# Patient Record
Sex: Female | Born: 1995 | Race: Black or African American | Hispanic: No | Marital: Single | Smoking: Never smoker
Health system: Southern US, Community
[De-identification: ages and names within clinical notes are randomized; demographics above are authoritative.]

## PROBLEM LIST (undated history)

## (undated) DIAGNOSIS — F418 Other specified anxiety disorders: Secondary | ICD-10-CM

## (undated) DIAGNOSIS — Z7289 Other problems related to lifestyle: Secondary | ICD-10-CM

## (undated) DIAGNOSIS — I519 Heart disease, unspecified: Secondary | ICD-10-CM

## (undated) DIAGNOSIS — F319 Bipolar disorder, unspecified: Secondary | ICD-10-CM

## (undated) DIAGNOSIS — F411 Generalized anxiety disorder: Secondary | ICD-10-CM

## (undated) DIAGNOSIS — G43909 Migraine, unspecified, not intractable, without status migrainosus: Secondary | ICD-10-CM

## (undated) DIAGNOSIS — R51 Headache: Secondary | ICD-10-CM

## (undated) DIAGNOSIS — F259 Schizoaffective disorder, unspecified: Secondary | ICD-10-CM

## (undated) DIAGNOSIS — N83201 Unspecified ovarian cyst, right side: Secondary | ICD-10-CM

## (undated) DIAGNOSIS — T7840XA Allergy, unspecified, initial encounter: Secondary | ICD-10-CM

## (undated) DIAGNOSIS — Q213 Tetralogy of Fallot: Secondary | ICD-10-CM

## (undated) DIAGNOSIS — I4510 Unspecified right bundle-branch block: Secondary | ICD-10-CM

## (undated) DIAGNOSIS — R440 Auditory hallucinations: Secondary | ICD-10-CM

## (undated) HISTORY — DX: Other specified anxiety disorders: F41.8

## (undated) HISTORY — DX: Heart disease, unspecified: I51.9

## (undated) HISTORY — DX: Headache: R51

## (undated) HISTORY — PX: THORACIC DUCT LIGATION: SHX2499

## (undated) HISTORY — PX: CARDIAC SURGERY: SHX584

## (undated) HISTORY — PX: GASTROSTOMY W/ FEEDING TUBE: SUR642

---
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
## 2016-03-03 ENCOUNTER — Telehealth: Payer: Self-pay | Admitting: *Deleted

## 2016-03-03 ENCOUNTER — Telehealth: Payer: Self-pay | Admitting: Neurology

## 2016-03-03 ENCOUNTER — Ambulatory Visit: Payer: Federal, State, Local not specified - PPO | Admitting: Neurology

## 2016-03-03 NOTE — Telephone Encounter (Signed)
Patient called at 8:35am to cancel her 2:30pm appt due to sickness.

## 2016-03-03 NOTE — Telephone Encounter (Signed)
Patient called 8:35:17 to cancel today's 2:30 appointment w/Dr. Terrace ArabiaYan, patient states she is not feeling well. Patient advised of NS/Cx < 24 hr policy, transferred  Call to A Atkins in billing.

## 2016-03-07 ENCOUNTER — Emergency Department (HOSPITAL_COMMUNITY)
Admission: EM | Admit: 2016-03-07 | Discharge: 2016-03-07 | Disposition: A | Discharge status: Home / Self Care | Payer: Federal, State, Local not specified - PPO | Attending: Emergency Medicine | Admitting: Emergency Medicine

## 2016-03-07 ENCOUNTER — Emergency Department (HOSPITAL_COMMUNITY): Payer: Federal, State, Local not specified - PPO

## 2016-03-07 ENCOUNTER — Encounter (HOSPITAL_COMMUNITY): Payer: Self-pay | Admitting: Emergency Medicine

## 2016-03-07 DIAGNOSIS — K2970 Gastritis, unspecified, without bleeding: Secondary | ICD-10-CM

## 2016-03-07 DIAGNOSIS — N3090 Cystitis, unspecified without hematuria: Secondary | ICD-10-CM | POA: Diagnosis not present

## 2016-03-07 DIAGNOSIS — R1084 Generalized abdominal pain: Secondary | ICD-10-CM | POA: Insufficient documentation

## 2016-03-07 DIAGNOSIS — R300 Dysuria: Secondary | ICD-10-CM | POA: Diagnosis present

## 2016-03-07 DIAGNOSIS — M545 Low back pain, unspecified: Secondary | ICD-10-CM

## 2016-03-07 LAB — COMPREHENSIVE METABOLIC PANEL
ALBUMIN: 4.5 g/dL (ref 3.5–5.0)
ALK PHOS: 59 U/L (ref 38–126)
ALT: 15 U/L (ref 14–54)
ANION GAP: 6 (ref 5–15)
AST: 25 U/L (ref 15–41)
BILIRUBIN TOTAL: 0.5 mg/dL (ref 0.3–1.2)
BUN: 19 mg/dL (ref 6–20)
CALCIUM: 9.3 mg/dL (ref 8.9–10.3)
CO2: 22 mmol/L (ref 22–32)
CREATININE: 0.96 mg/dL (ref 0.44–1.00)
Chloride: 106 mmol/L (ref 101–111)
GFR calc Af Amer: >60 mL/min (ref >60)
GFR calc non Af Amer: >60 mL/min (ref >60)
GLUCOSE: 98 mg/dL (ref 65–99)
Potassium: 4.4 mmol/L (ref 3.5–5.1)
Sodium: 134 mmol/L — ABNORMAL LOW (ref 135–145)
TOTAL PROTEIN: 7.1 g/dL (ref 6.5–8.1)

## 2016-03-07 LAB — URINALYSIS, ROUTINE W REFLEX MICROSCOPIC
BILIRUBIN URINE: NEGATIVE
Glucose, UA: NEGATIVE mg/dL
HGB URINE DIPSTICK: NEGATIVE
KETONES UR: NEGATIVE mg/dL
Leukocytes, UA: NEGATIVE
NITRITE: NEGATIVE
Protein, ur: NEGATIVE mg/dL
Specific Gravity, Urine: 1.03 (ref 1.005–1.030)
pH: 6.5 (ref 5.0–8.0)

## 2016-03-07 LAB — CBC
HCT: 42.9 % (ref 36.0–46.0)
Hemoglobin: 14.3 g/dL (ref 12.0–15.0)
MCH: 30.2 pg (ref 26.0–34.0)
MCHC: 33.3 g/dL (ref 30.0–36.0)
MCV: 90.7 fL (ref 78.0–100.0)
Platelets: 130 10*3/uL — ABNORMAL LOW (ref 150–400)
RBC: 4.73 MIL/uL (ref 3.87–5.11)
RDW: 12.4 % (ref 11.5–15.5)
WBC: 3.3 10*3/uL — ABNORMAL LOW (ref 4.0–10.5)

## 2016-03-07 LAB — I-STAT BETA HCG BLOOD, ED (MC, WL, AP ONLY)

## 2016-03-07 LAB — I-STAT CG4 LACTIC ACID, ED: LACTIC ACID, VENOUS: 0.97 mmol/L (ref 0.5–1.9)

## 2016-03-07 MED ORDER — DEXTROSE 5 % IV SOLN
1.0000 g | Freq: Once | INTRAVENOUS | Status: AC
  Administered 2016-03-07: 1 g via INTRAVENOUS
  Filled 2016-03-07: qty 10

## 2016-03-07 MED ORDER — HYDROCODONE-ACETAMINOPHEN 5-325 MG PO TABS
2.0000 | ORAL_TABLET | Freq: Once | ORAL | Status: AC
  Administered 2016-03-07: 2 via ORAL
  Filled 2016-03-07: qty 2

## 2016-03-07 MED ORDER — PHENAZOPYRIDINE HCL 200 MG PO TABS: 200.0000 mg | ORAL_TABLET | Freq: Three times a day (TID) | ORAL | 0 refills | Status: DC | PRN

## 2016-03-07 MED ORDER — ONDANSETRON HCL 4 MG/2ML IJ SOLN
4.0000 mg | Freq: Once | INTRAMUSCULAR | Status: AC
  Administered 2016-03-07: 4 mg via INTRAVENOUS
  Filled 2016-03-07: qty 2

## 2016-03-07 MED ORDER — MORPHINE SULFATE (PF) 4 MG/ML IV SOLN
4.0000 mg | Freq: Once | INTRAVENOUS | Status: AC
  Administered 2016-03-07: 4 mg via INTRAVENOUS
  Filled 2016-03-07: qty 1

## 2016-03-07 MED ORDER — PHENAZOPYRIDINE HCL 100 MG PO TABS
200.0000 mg | ORAL_TABLET | Freq: Once | ORAL | Status: AC
  Administered 2016-03-07: 200 mg via ORAL
  Filled 2016-03-07: qty 2

## 2016-03-07 MED ORDER — CEPHALEXIN 500 MG PO CAPS: 500.0000 mg | ORAL_CAPSULE | Freq: Four times a day (QID) | ORAL | 0 refills | Status: DC

## 2016-03-07 MED ORDER — IOPAMIDOL (ISOVUE-300) INJECTION 61%
INTRAVENOUS | Status: AC
  Administered 2016-03-07: 100 mL
  Filled 2016-03-07: qty 100

## 2016-03-07 MED ORDER — IOPAMIDOL (ISOVUE-300) INJECTION 61%
INTRAVENOUS | Status: AC
  Filled 2016-03-07: qty 30

## 2016-03-07 MED ORDER — SODIUM CHLORIDE 0.9 % IV BOLUS (SEPSIS)
500.0000 mL | Freq: Once | INTRAVENOUS | Status: AC
  Administered 2016-03-07: 500 mL via INTRAVENOUS

## 2016-03-07 NOTE — ED Notes (Signed)
Pt finished contrast and ambulated to br.

## 2016-03-07 NOTE — ED Notes (Signed)
Patient drinking oral contrast at this time.

## 2016-03-07 NOTE — ED Triage Notes (Signed)
Patient arrives with continued complaints of lower back pain. Patient was seen about 5 days ago for the same and found to have a bladder infection. Has been taking Septra since Wednesday with no improvement of symptoms. Now endorses general malaise and diffuse aching pain throughout body coupled with chest pain. 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 Last surgery @ age 20 with good checkups since. Now states that she has dysuria and urgency too.

## 2016-03-07 NOTE — ED Notes (Signed)
Patient transported to CT 

## 2016-03-07 NOTE — ED Notes (Signed)
Patient transported to X-ray 

## 2016-03-07 NOTE — Discharge Instructions (Signed)
1. Medications: add Keflex to the Bactrim that you are already taking, pyridium, usual home medications 2. Treatment: rest, drink plenty of fluids, take medications as prescribed 3. Follow Up: Please followup with your primary doctor in 3 days for discussion of your diagnoses and further evaluation after today's visit; if you do not have a primary care doctor use the resource guide provided to find one; return to the ER for fevers, persistent vomiting, worsening abdominal pain or other concerning symptoms.

## 2016-03-07 NOTE — ED Provider Notes (Signed)
[facility]-EMERGENCY DEPT Provider Note   CSN: [id number] Arrival date & time: 03/07/16  0034     History   Chief Complaint Chief Complaint  Patient presents with  . Dysuria  . Back Pain    HPI Alexis [name] is a 20 y.o. female with a hx of headache, Tetralogy of flow repaired at age 21 and followed by Dr. Ace Gins of [facility] cardiology presents to the Emergency Department complaining of gradual, persistent, progressively worsening total body pain onset 2 days ago. Patient reports that 4 days ago she began experiencing low back pain and dysuria. She was evaluated at an urgent care and diagnosed with urinary tract infection. She reports she began Bactrim 3 days ago and has been compliant. She reports that the pain in her low back has worsened and now has spread throughout her entire body. She denies known falls or injuries. She reports that lumbar x-rays taken at the urgent care showed scoliosis but no other acute abnormalities. Patient also reports associated generalized abdominal pain and nausea without vomiting. She reports unmeasured fever 3 days ago that has not returned. Dysuria persists. No hematuria, urinary frequency or urinary urgency. She denies vaginal discharge.    The history is provided by the patient and medical records. No language interpreter was used.    Past Medical History:  Diagnosis Date  . Allergy   . Depression with anxiety   . Headache(784.0)   . Heart disease   . 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 Chronic migraine without aura 08/31/2012  . Insomnia, unspecified 08/31/2012  . 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 Adjustment disorder 07/26/2011  . Abdominal pain 07/26/2011    Past Surgical History:  Procedure Laterality Date  . CARDIAC SURGERY    . CARDIAC SURGERY     4 open heart surgeries  . GASTROSTOMY W/ FEEDING TUBE     removed 1 year ago   . THORACIC DUCT LIGATION      OB History    No data available         Home Medications    Prior to Admission medications   Medication Sig Start Date End Date Taking? Authorizing Provider  buPROPion (WELLBUTRIN XL) 150 MG 24 hr tablet Take 150 mg by mouth every morning. 10/23/15  Yes Historical Provider, MD  divalproex (DEPAKOTE ER) 500 MG 24 hr tablet Take 1 tablet (500 mg total) by mouth at bedtime. 11/27/15  Yes Levert Feinstein, MD  naproxen sodium (ANAPROX) 220 MG tablet Take 220 mg by mouth 2 (two) times daily as needed (for pain).   Yes Historical Provider, MD  traMADol (ULTRAM) 50 MG tablet Take 50 mg by mouth every 6 (six) hours as needed for moderate pain.   Yes Historical Provider, MD  cephALEXin (KEFLEX) 500 MG capsule Take 1 capsule (500 mg total) by mouth 4 (four) times daily. 03/07/16   Maizie Garno, PA-C  phenazopyridine (PYRIDIUM) 200 MG tablet Take 1 tablet (200 mg total) by mouth 3 (three) times daily as needed for pain. 03/07/16   Dahlia Client Melana Hingle, PA-C    Family History Family History  Problem Relation Age of Onset  . Hypertension Maternal Grandmother   . Diabetes Maternal Grandfather   . Heart disease Maternal Grandfather   . Stroke Maternal Grandfather   . Hypertension Mother   . Healthy Father     Social History Social History  Substance Use Topics  . Smoking status: Never  Smoker  . Smokeless tobacco: Never Used  . Alcohol use No     Allergies   Dopamine   Review of Systems Review of Systems  Constitutional: Positive for fever ( Subjective).  Gastrointestinal: Positive for abdominal pain ( Generalized).  Genitourinary: Positive for dysuria.  Musculoskeletal: Positive for back pain ( low) and myalgias.  All other systems reviewed and are negative.    Physical Exam Updated Vital Signs BP 115/80   Pulse 63   Temp 98.3 F (36.8 C) (Oral)   Resp 18   Ht 5\' 6"  (1.676 m)   Wt 49.6 kg   LMP 02/29/2016 (Exact Date)   SpO2 100%   BMI 17.64 kg/m   Physical Exam  Constitutional: She appears  well-developed and well-nourished. No distress.  Awake, alert, nontoxic appearance  HENT:  Head: Normocephalic and atraumatic.  Mouth/Throat: Oropharynx is clear and moist. No oropharyngeal exudate.  Eyes: Conjunctivae are normal. No scleral icterus.  Neck: Normal range of motion. Neck supple.  Cardiovascular: Normal rate, regular rhythm and intact distal pulses.   Murmur heard. Sternotomy scar noted  Pulmonary/Chest: Effort normal and breath sounds normal. No respiratory distress. She has no wheezes.  Equal chest expansion  Abdominal: Soft. Bowel sounds are normal. She exhibits no mass. There is no splenomegaly or hepatomegaly. There is generalized tenderness. There is no rigidity, no rebound, no guarding, no tenderness at McBurney's point and negative Murphy's sign.  Musculoskeletal: Normal range of motion. She exhibits no edema.  Neurological: She is alert.  Speech is clear and goal oriented Moves extremities without ataxia  Skin: Skin is warm and dry. She is not diaphoretic.  Psychiatric: She has a normal mood and affect.  Nursing note and vitals reviewed.    ED Treatments / Results  Labs (all labs ordered are listed, but only abnormal results are displayed) Labs Reviewed  COMPREHENSIVE METABOLIC PANEL - Abnormal; Notable for the following:       Result Value   Sodium 134 (*)    All other components within normal limits  CBC - Abnormal; Notable for the following:    WBC 3.3 (*)    Platelets 130 (*)    All other components within normal limits  URINE CULTURE  URINALYSIS, ROUTINE W REFLEX MICROSCOPIC (NOT AT [facility])  I-STAT CG4 LACTIC ACID, ED  I-STAT BETA HCG BLOOD, ED ([facility], [facility], [facility] ONLY)     Radiology Dg Chest 2 View  Result Date: 03/07/2016 CLINICAL DATA:  Low back pain. History of tetralogy of Fallot, multiple cardiac surgeries. EXAM: CHEST  2 VIEW COMPARISON:  01/28/2015 FINDINGS: The heart is enlarged but stable. Mild prominence the pulmonary vasculature especially  the left is unchanged. Median sternotomy sutures and surgical clips project over the cardiac and mediastinal silhouette. The lungs are free of infiltrates. There is apical pleural parenchymal thickening and minimal scarring. There is left basilar atelectasis. No CHF. No pneumothorax. No acute osseous abnormality. IMPRESSION: Stable cardiomegaly.  No acute cardiopulmonary disease. Electronically Signed   By: Tollie Eth M.D.   On: 03/07/2016 03:31   Ct Abdomen Pelvis W Contrast  Result Date: 03/07/2016 CLINICAL DATA:  20 year old female with abdominal pain and bilateral flank pain. History of UTIs. EXAM: CT ABDOMEN AND PELVIS WITH CONTRAST TECHNIQUE: Multidetector CT imaging of the abdomen and pelvis was performed using the standard protocol following bolus administration of intravenous contrast. CONTRAST:  ISOVUE-300 IOPAMIDOL (ISOVUE-300) INJECTION 61% COMPARISON:  Abdominal CT dated 07/25/2011 and ultrasound dated 07/28/2011 FINDINGS: Lower chest: The  visualized lung bases are clear. Mild eventration of the left hemidiaphragm. No intra-abdominal free air.  Small free fluid within the pelvis. Hepatobiliary: There is mild haziness of the gallbladder wall. No stone identified. Mild periportal edema may be present. There is slight heterogeneity of the liver. A 9 x 8 mm hypo enhancing focus at the dome of the liver (series 2, image 17 and coronal series 5 image 61) is not well characterized but may represent a flash filling hemangioma or a portal venous shunting. MRI may provide better characterisation. Pancreas: Unremarkable. No pancreatic ductal dilatation or surrounding inflammatory changes. Spleen: Normal in size without focal abnormality. Adrenals/Urinary Tract: Adrenal glands, kidneys, and the visualized ureters appear unremarkable. There is apparent diffuse thickening of the bladder wall which may be partly related to underdistention but concerning for cystitis. Correlation with urinalysis  recommended. Stomach/Bowel: There is thickened appearance of the distal stomach and gastric antrum concerning for gastritis. Correlation with clinical exam recommended. Moderate stool noted throughout the colon. There is no evidence of bowel obstruction. Normal appendix. Vascular/Lymphatic: The abdominal aorta and IVC appear unremarkable. The origins of the celiac axis, SMA, IMA as well as the origins of the renal arteries are patent. The splenic vein, SMV, and main portal vein are patent. No portal venous gas identified. There is prominence of the pelvic vasculature. Clinical correlation is recommended to evaluate for pelvic congestion syndrome. There is no adenopathy. Reproductive: The uterus is anteverted and grossly unremarkable. There is a 1.5 cm collapsed left ovarian dominant follicle or corpus luteum. Other: There is cardiomegaly. Median sternotomy wire and pectus excavatum deformity. Multiple surgical clips noted along the posterior mediastinum and distal esophagus. Musculoskeletal: No acute osseous pathology. IMPRESSION: Mild thickened appearance of the distal stomach concerning for gastritis. Clinical correlation is recommended. No evidence of bowel obstruction. Normal appendix. Minimal haziness of the gallbladder wall may be related to inflammatory changes of the stomach. Cholecystitis is less likely. No gallstone identified. Ultrasound may provide better evaluation of the gallbladder. Thickened appearance of the bladder wall. Correlation with urinalysis recommended to exclude cystitis. A 1.5 cm left ovarian collapsed corpus luteum or dominant follicle. Electronically Signed   By: Elgie Collard M.D.   On: 03/07/2016 05:36    Procedures Procedures (including critical care time)  Medications Ordered in ED Medications  iopamidol (ISOVUE-300) 61 % injection (not administered)  sodium chloride 0.9 % bolus 500 mL (0 mLs Intravenous Stopped 03/07/16 0313)  HYDROcodone-acetaminophen (NORCO/VICODIN)  5-325 MG per tablet 2 tablet (2 tablets Oral Given 03/07/16 0305)  iopamidol (ISOVUE-300) 61 % injection (100 mLs  Contrast Given 03/07/16 0458)  ondansetron (ZOFRAN) injection 4 mg (4 mg Intravenous Given 03/07/16 0445)  morphine 4 MG/ML injection 4 mg (4 mg Intravenous Given 03/07/16 0553)  cefTRIAXone (ROCEPHIN) 1 g in dextrose 5 % 50 mL IVPB (1 g Intravenous New Bag/Given 03/07/16 0618)  phenazopyridine (PYRIDIUM) tablet 200 mg (200 mg Oral Given 03/07/16 1610)     Initial Impression / Assessment and Plan / ED Course  I have reviewed the triage vital signs and the nursing notes.  Pertinent labs & imaging results that were available during my care of the patient were reviewed by me and considered in my medical decision making (see chart for details).  Clinical Course  Value Comment By Time  I-stat hCG, quantitative: <5.0 Negative Dierdre Forth, PA-C 10/22 0444  Lactic Acid, Venous: 0.97 Normal Dierdre Forth, PA-C 10/22 0444  Hemoglobin: 14.3 No anemia, very mild leukopenia Dierdre Forth, PA-C 10/22  0444  Leukocytes, UA: NEGATIVE No evidence of UTI Dierdre ForthHannah Naythan Douthit, PA-C 10/22 0444  Creatinine: 0.96 Normal and electrolytes reassuring Dierdre ForthHannah Lailie Smead, PA-C 10/22 0444  BP: (!) 68/27 Aberrant reading, repeat within normal limits Dierdre ForthHannah Melanny Wire, PA-C 10/22 0445  BP: 132/86 Vital signs have remained stable. Dierdre ForthHannah Ozan Maclay, PA-C 10/22 [phone]    Patient with generalized abdominal pain, myalgias, low back pain and persistent dysuria.  CT scan shows persistent cystitis and gastritis. Questionable haziness of the gallbladder however on exam patient abdomen is soft and nonfocal. I highly doubt cholecystitis. No emesis.  Patient given Rocephin here in the emergency department. She will discharged home with Keflex in addition to her Bactrim. She is to follow-up with her primary care physician in 24 hours. Discussed reasons to emergency department including worsening  pain, high fevers or other concerns.  The patient was discussed with and seen by Dr. Wilkie AyeHorton who agrees with the treatment plan.   Final Clinical Impressions(s) / ED Diagnoses   Final diagnoses:  Cystitis  Gastritis, presence of bleeding unspecified, unspecified chronicity, unspecified gastritis type  Dysuria  Low back pain without sciatica, unspecified back pain laterality, unspecified chronicity    New Prescriptions New Prescriptions   CEPHALEXIN (KEFLEX) 500 MG CAPSULE    Take 1 capsule (500 mg total) by mouth 4 (four) times daily.   PHENAZOPYRIDINE (PYRIDIUM) 200 MG TABLET    Take 1 tablet (200 mg total) by mouth 3 (three) times daily as needed for pain.     Dahlia ClientHannah Orvel Cutsforth, PA-C 03/07/16 [id number]    Shon Batonourtney F Horton, MD 03/14/16 [phone]

## 2016-03-07 NOTE — ED Notes (Signed)
Patient returned from xray.

## 2016-03-08 LAB — URINE CULTURE: Culture: NO GROWTH

## 2016-03-23 ENCOUNTER — Ambulatory Visit (INDEPENDENT_AMBULATORY_CARE_PROVIDER_SITE_OTHER): Payer: Federal, State, Local not specified - PPO | Admitting: Neurology

## 2016-03-23 ENCOUNTER — Encounter: Payer: Self-pay | Admitting: Neurology

## 2016-03-23 VITALS — BP 99/66 | HR 65 | Ht 66.0 in | Wt 109.0 lb

## 2016-03-23 DIAGNOSIS — Q249 Congenital malformation of heart, unspecified: Secondary | ICD-10-CM | POA: Diagnosis not present

## 2016-03-23 DIAGNOSIS — G43711 Chronic migraine without aura, intractable, with status migrainosus: Secondary | ICD-10-CM

## 2016-03-23 MED ORDER — PROMETHAZINE HCL 12.5 MG PO TABS: 12.5000 mg | ORAL_TABLET | Freq: Four times a day (QID) | ORAL | 6 refills | Status: DC | PRN

## 2016-03-23 NOTE — Progress Notes (Signed)
Chief Complaint  Patient presents with  . Migraine    She estimates 2- 4 headaches per month.  She is being prescribed Depakote ER 500mg , qhs but takes it inconsistently.   She is no longer using Fioricet or Zofran.  States her headaches easily resolve with sleep.      PATIENT: Alexis [name] DOB: [birth year]  Chief Complaint  Patient presents with  . Migraine    She estimates 2- 4 headaches per month.  She is being prescribed Depakote ER 500mg , qhs but takes it inconsistently.   She is no longer using Fioricet or Zofran.  States her headaches easily resolve with sleep.     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  She has migriane since middle school, worsening recently, Her typical migraine are lateralized severe pounding headache with associated light noise sensitivity, nauseous, lasting for a few hours or whole day, she could not function during migraine, she has been taking Aleve one tablet, with limited help, previously she has tried propanolol as preventive medication for 2 months without significant improvement   She has worsening more frequent headaches since 2016, May 2017, she has daily headaches, she denies visual change, no lateralized motor or sensory deficit, in addition she complains of frequent nausea, Zofran helps some,   She has never tried triptan treatment in the past, we have discussed potential cardiac risk factors, with her history of cardiac arrest, with  dopamine use, we decided not to proceed with triptan treatments   [facility]:  1.There is not tricuspid regurgitation velocity with which to reliably predict RV-RA peak pressure difference. 2. No residual ventricular septal defect postoperatively. 3. S/p patch closure ventricular septal defect surgery. 4. Leftward cardiac apex. 5. 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 6. Intact atrial septum. 7. Mildly dilated right ventricle. 8. Normal right ventricular systolic function. 9. Normal-size left atrium. 10. Normal-size right atrium. 11. Normal left ventricular cavity size and systolic function. 12. Normal aortic valve. 13. Right aortic arch with mirror-image branching. 14. Limited visualization of left and right pulmonary arteries; branch pulmonary arteries appear relatively small; peak flow velocity 1.6-2 m/sec. 15. Mild right ventricular dilatation with flat to mildly paradoxic ventricular septal motion in parasternal short axis view. 16. The RV to pulmonary artery conduit was not visualized. 17. No left ventricular outflow tract obstruction. 18. No pericardial effusion.  UPDATE November 27 2015: We have personally reviewed MRI of the brain July 2017, left frontal subcortical white matter single gliosis, no acute abnormality She is able to tolerate Depakote ER 250 mg 2 tablets every night without significant side effect, it does help her headache, she reported 40% improvement, she has less headache, lasting shorter, it also helped her mood  She still has 3-4 headaches each week, lasting about 3 hours, was nausea, light noise sensitivity, She is only taking Aleve as needed for headaches, Zofran was helpful  UPDATE Nov 7th 2017:  Her migraine has much improved, now cluster around her menstruation, she is now taking Depakote as regularly, she is now having migraine headaches couple times a month, sleep usually helps her  headache,  REVIEW OF SYSTEMS: Full 14 system review of systems performed and notable only for : Fatigue, runny nose, cough, chest tightness, abdominal pain, constipation, diarrhea, nausea, vomiting, insomnia, frequent infection, difficulty urinating, painful urination, dizziness, headaches, achy muscles depression anxiety  ALLERGIES: Allergies  Allergen Reactions  . Dopamine     Makes WBC rise    HOME MEDICATIONS: Current Outpatient Prescriptions  Medication Sig Dispense Refill  . buPROPion (WELLBUTRIN XL) 150 MG 24 hr tablet Take 150 mg by mouth every morning.  0  . cephALEXin (KEFLEX) 500 MG capsule Take 1 capsule (500 mg total) by mouth 4 (four) times daily. 40 capsule 0  . divalproex (DEPAKOTE ER) 500 MG 24 hr tablet Take 1 tablet (500 mg total) by mouth at bedtime. 30 tablet 11  . naproxen sodium (ANAPROX) 220 MG tablet Take 220 mg by mouth 2 (two) times daily as needed (for pain).    . phenazopyridine (PYRIDIUM) 200 MG tablet Take 1 tablet (200 mg total) by mouth 3 (three) times daily as needed for pain. 6 tablet 0  . traMADol (ULTRAM) 50 MG tablet Take 50 mg by mouth every 6 (six) hours as needed for moderate pain.     No current facility-administered medications for this visit.     PAST MEDICAL HISTORY: Past Medical History:  Diagnosis Date  . Allergy   . Depression with anxiety   . Headache(784.0)   . Heart disease   . Tetralogy of Fallot     PAST SURGICAL HISTORY: Past Surgical History:  Procedure Laterality Date  . CARDIAC SURGERY    . CARDIAC SURGERY     4 open heart surgeries  . GASTROSTOMY W/ FEEDING TUBE     removed 1 year ago   . THORACIC DUCT LIGATION      FAMILY HISTORY: Family History  Problem Relation Age of Onset  . Hypertension Maternal Grandmother   . Diabetes Maternal Grandfather   . Heart disease Maternal Grandfather   . Stroke Maternal Grandfather   . Hypertension Mother   . Healthy Father     SOCIAL HISTORY:  Social History    Social History  . Marital status: Single    Spouse name: N/A  . Number of children: 0  . Years of education: College   Occupational History  . Student    Social History Main Topics  . Smoking status: Never Smoker  . Smokeless tobacco: Never Used  . Alcohol use No  . Drug use: No  . Sexual activity: Not on file   Other Topics Concern  . Not on file   Social History Narrative   Lives at home with mother.   Right-handed.   No more than 2 cups caffeine per day.     PHYSICAL EXAM   Vitals:   03/23/16 0744  BP: 99/66  Pulse: 65  Weight: 109 lb (49.4 kg)  Height: 5\' 6"  (1.676 m)    Not recorded      Body mass index is 17.59 kg/m.  PHYSICAL EXAMNIATION:  Gen: NAD, conversant, well nourised, obese, well groomed                     Cardiovascular: Regular rate rhythm, systolic ejection murmur Eyes: Conjunctivae clear without exudates or hemorrhage Neck: Supple, no carotid bruise. Pulmonary:  Clear to auscultation bilaterally   NEUROLOGICAL EXAM:  MENTAL STATUS: Speech:    Speech is normal; fluent and spontaneous with normal comprehension.  Cognition:     Orientation to time, place and person     Normal recent and remote memory     Normal Attention span and concentration     Normal Language, naming, repeating,spontaneous speech     Fund of knowledge   CRANIAL NERVES: CN II: Visual fields are full to confrontation. Fundoscopic exam is normal with sharp discs and no vascular changes. Pupils are round equal and briskly reactive to light. CN III, IV, VI: extraocular movement are normal. No ptosis. CN V: Facial sensation is intact to pinprick in all 3 divisions bilaterally. Corneal responses are intact.  CN VII: Face is symmetric with normal eye closure and smile. CN VIII: Hearing is normal to rubbing fingers CN IX, X: Palate elevates symmetrically. Phonation is normal. CN XI: Head turning and shoulder shrug are intact CN XII: Tongue is midline with normal  movements and no atrophy.  MOTOR: There is no pronator drift of out-stretched arms. Muscle bulk and tone are normal. Muscle strength is normal.  REFLEXES: Reflexes are 2+ and symmetric at the biceps, triceps, knees, and ankles. Plantar responses are flexor.  SENSORY: Intact to light touch, pinprick, positional sensation and vibratory sensation are intact in fingers and toes.  COORDINATION: Rapid alternating movements and fine finger movements are intact. There is no dysmetria on finger-to-nose and heel-knee-shin.    GAIT/STANCE: Posture is normal. Gait is steady with normal steps, base, arm swing, and turning. Heel and toe walking are normal. Tandem gait is normal.  Romberg is absent.   DIAGNOSTIC DATA (LABS, IMAGING, TESTING) - I reviewed patient records, labs, notes, testing and imaging myself where available.  ASSESSMENT AND PLAN  [name] N Quest is a 20 y.o. female   Chronic migraine headaches   MRI of the brain; showed no significant abnormality  She has tried Inderal in the past without helping,   Depakote has helped her migraine, no longer require preventive medications   her migraine is much improved now only clustered around her menstruation, as needed Phenergan, NSAIDs, sleep was helpful.   Tetralogy of Fallot,   History of 4 open-heart surgery in the past    Levert Feinstein, M.D. Ph.D.  [facility] [address], [address] [place], [place] [address] Ph: [phone] Fax: [phone]  CC: Maryellen Pile, MD

## 2016-03-23 NOTE — Patient Instructions (Signed)
Magnesium oxide 400 mg twice a day Riboflavin 100 mg twice a day  Phenergan 25 mg as needed for headache and nausea, it is ok to take it together with other headache medication.

## 2016-04-04 ENCOUNTER — Emergency Department (HOSPITAL_COMMUNITY): Payer: Federal, State, Local not specified - PPO

## 2016-04-04 ENCOUNTER — Emergency Department (HOSPITAL_COMMUNITY)
Admission: EM | Admit: 2016-04-04 | Discharge: 2016-04-04 | Disposition: A | Discharge status: Home / Self Care | Payer: Federal, State, Local not specified - PPO | Attending: Emergency Medicine | Admitting: Emergency Medicine

## 2016-04-04 ENCOUNTER — Encounter (HOSPITAL_COMMUNITY): Payer: Self-pay | Admitting: *Deleted

## 2016-04-04 DIAGNOSIS — R0789 Other chest pain: Secondary | ICD-10-CM | POA: Diagnosis not present

## 2016-04-04 DIAGNOSIS — R071 Chest pain on breathing: Secondary | ICD-10-CM | POA: Diagnosis present

## 2016-04-04 LAB — BASIC METABOLIC PANEL
Anion gap: 8 (ref 5–15)
BUN: 19 mg/dL (ref 6–20)
CO2: 20 mmol/L — ABNORMAL LOW (ref 22–32)
Calcium: 9.3 mg/dL (ref 8.9–10.3)
Chloride: 109 mmol/L (ref 101–111)
Creatinine, Ser: 0.99 mg/dL (ref 0.44–1.00)
GFR calc Af Amer: >60 mL/min (ref >60)
Glucose, Bld: 96 mg/dL (ref 65–99)
POTASSIUM: 3.9 mmol/L (ref 3.5–5.1)
SODIUM: 137 mmol/L (ref 135–145)

## 2016-04-04 LAB — TROPONIN I: Troponin I: <0.03 ng/mL (ref <0.03)

## 2016-04-04 LAB — CBC
HEMATOCRIT: 40.2 % (ref 36.0–46.0)
HEMOGLOBIN: 13.1 g/dL (ref 12.0–15.0)
MCH: 29.9 pg (ref 26.0–34.0)
MCHC: 32.6 g/dL (ref 30.0–36.0)
MCV: 91.8 fL (ref 78.0–100.0)
Platelets: 125 10*3/uL — ABNORMAL LOW (ref 150–400)
RBC: 4.38 MIL/uL (ref 3.87–5.11)
RDW: 12.3 % (ref 11.5–15.5)
WBC: 3.3 10*3/uL — AB (ref 4.0–10.5)

## 2016-04-04 LAB — URINALYSIS, ROUTINE W REFLEX MICROSCOPIC
Bilirubin Urine: NEGATIVE
GLUCOSE, UA: NEGATIVE mg/dL
Hgb urine dipstick: NEGATIVE
KETONES UR: 15 mg/dL — AB
LEUKOCYTES UA: NEGATIVE
NITRITE: NEGATIVE
PROTEIN: NEGATIVE mg/dL
Specific Gravity, Urine: 1.038 — ABNORMAL HIGH (ref 1.005–1.030)
pH: 7 (ref 5.0–8.0)

## 2016-04-04 LAB — D-DIMER, QUANTITATIVE (NOT AT ARMC)

## 2016-04-04 MED ORDER — IBUPROFEN 800 MG PO TABS: 800.0000 mg | ORAL_TABLET | Freq: Three times a day (TID) | ORAL | 0 refills | Status: DC

## 2016-04-04 NOTE — ED Triage Notes (Signed)
Lt sided chest pain for 3 days she was given wellbutryn  On Friday and she thinks  That may be the reason.  She has had 3heart surgeries in the past   The last one was when she was 20 yrs old  Hx tetrology of fallot   lmp last sunday

## 2016-04-04 NOTE — ED Notes (Signed)
Pt states they changed the dosage of her wellbutrin to 300 mg on Wednesday, 03/31/16, and on Wednesday night she began having chest pains. Pt states the pain in under her left breast and radiates to her lower back. Pt does have a cardiac history of 4 open heart surgeries, tetralogy of fallot.

## 2016-04-04 NOTE — Discharge Instructions (Signed)
There is no evidence of heart attack or blood clot in the lung. Take the anti inflammatory medication as prescribed. Followup with your doctor. Return to the ED if you develop new or worsening symptoms.

## 2016-04-04 NOTE — ED Provider Notes (Signed)
[facility]Y DEPT Provider Note   CSN: [id number] Arrival date & time: 04/04/16  0302     History   Chief Complaint Chief Complaint  Patient presents with  . Chest Pain    HPI Alexis [name] is a 20 y.o. female.  Patient presents with 3 days of left-sided chest pain that is progressively worsening. It does not radiate patient denies any falls or injuries. She has a history of congenital heart disease status post multiple surgeries most recently 11 years ago. [facility]. Last echocardiogram was reassuring. Patient states she's had right-sided chest pain before but never on the left. It is worse with palpation and movement. She also associated with shortness of breath. There is no nausea or vomiting. No cough or fever. She is taking some naproxen with partial relief. She feels that it could be related to her recent increase in her Wellbutrin dose.   The history is provided by the patient. The history is limited by the condition of the patient.  Chest Pain   Associated symptoms include shortness of breath. Pertinent negatives include no abdominal pain, no back pain, no dizziness, no fever, no headaches, no nausea and no vomiting.    Past Medical History:  Diagnosis Date  . Allergy   . Depression with anxiety   . Headache(784.0)   . Heart disease   . 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 Chronic migraine without aura 08/31/2012  . Insomnia, unspecified 08/31/2012  . 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 Adjustment disorder 07/26/2011  . Abdominal pain 07/26/2011    Past Surgical History:  Procedure Laterality Date  . CARDIAC SURGERY    . CARDIAC SURGERY     4 open heart surgeries  . GASTROSTOMY W/ FEEDING TUBE     removed 1 year ago   . THORACIC DUCT LIGATION      OB History    No data available       Home Medications    Prior to Admission medications   Medication Sig Start Date  End Date Taking? Authorizing Provider  buPROPion (WELLBUTRIN XL) 150 MG 24 hr tablet Take 150 mg by mouth every morning. 10/23/15  Yes Historical Provider, MD  divalproex (DEPAKOTE ER) 500 MG 24 hr tablet Take 1 tablet (500 mg total) by mouth at bedtime. 11/27/15  Yes Levert FeinsteinYijun Yan, MD  promethazine (PHENERGAN) 12.5 MG tablet Take 1 tablet (12.5 mg total) by mouth every 6 (six) hours as needed for nausea or vomiting. 03/23/16  Yes Levert FeinsteinYijun Yan, MD  cephALEXin (KEFLEX) 500 MG capsule Take 1 capsule (500 mg total) by mouth 4 (four) times daily. Patient not taking: Reported on 04/04/2016 03/07/16   Dahlia ClientHannah Muthersbaugh, PA-C  phenazopyridine (PYRIDIUM) 200 MG tablet Take 1 tablet (200 mg total) by mouth 3 (three) times daily as needed for pain. Patient not taking: Reported on 04/04/2016 03/07/16   Dahlia ClientHannah Muthersbaugh, PA-C    Family History Family History  Problem Relation Age of Onset  . Hypertension Maternal Grandmother   . Diabetes Maternal Grandfather   . Heart disease Maternal Grandfather   . Stroke Maternal Grandfather   . Hypertension Mother   . Healthy Father     Social History Social History  Substance Use Topics  . Smoking status: Never Smoker  . Smokeless tobacco: Never Used  . Alcohol use No     Allergies   Dopamine   Review  of Systems Review of Systems  Constitutional: Negative for activity change, appetite change and fever.  HENT: Negative for congestion and rhinorrhea.   Respiratory: Positive for chest tightness and shortness of breath.   Cardiovascular: Positive for chest pain.  Gastrointestinal: Negative for abdominal pain, nausea and vomiting.  Genitourinary: Negative for dysuria, hematuria, vaginal bleeding and vaginal discharge.  Musculoskeletal: Negative for arthralgias, back pain and myalgias.  Skin: Negative for wound.  Neurological: Negative for dizziness, light-headedness and headaches.  A complete 10 system review of systems was obtained and all systems are  negative except as noted in the HPI and PMH.     Physical Exam Updated Vital Signs BP 107/70   Pulse 63   Temp 98.5 F (36.9 C)   Resp 11   Ht 5\' 6"  (1.676 m)   Wt 109 lb (49.4 kg)   LMP 04/02/2016   SpO2 100%   BMI 17.59 kg/m   Physical Exam  Constitutional: She is oriented to person, place, and time. She appears well-developed and well-nourished. No distress.  HENT:  Head: Normocephalic and atraumatic.  Mouth/Throat: Oropharynx is clear and moist. No oropharyngeal exudate.  Eyes: Conjunctivae and EOM are normal. Pupils are equal, round, and reactive to light.  Neck: Normal range of motion. Neck supple.  No meningismus.  Cardiovascular: Normal rate, regular rhythm and intact distal pulses.   Murmur heard. 3/6 systolic murmur left sternal border  Pulmonary/Chest: Effort normal and breath sounds normal. No respiratory distress. She exhibits tenderness.  Well-healed surgical incisions. There is diffuse tenderness of the left chest wall. Chaperone present for breast exam which shows no palpable masses or nodules.   Abdominal: Soft. There is no tenderness. There is no rebound and no guarding.  Musculoskeletal: Normal range of motion. She exhibits no edema or tenderness.  Neurological: She is alert and oriented to person, place, and time. No cranial nerve deficit. She exhibits normal muscle tone. Coordination normal.   5/5 strength throughout. CN 2-12 intact.Equal grip strength.   Skin: Skin is warm.  Psychiatric: She has a normal mood and affect. Her behavior is normal.  Nursing note and vitals reviewed.    ED Treatments / Results  Labs (all labs ordered are listed, but only abnormal results are displayed) Labs Reviewed  BASIC METABOLIC PANEL - Abnormal; Notable for the following:       Result Value   CO2 20 (*)    All other components within normal limits  CBC - Abnormal; Notable for the following:    WBC 3.3 (*)    Platelets 125 (*)    All other components within  normal limits  URINALYSIS, ROUTINE W REFLEX MICROSCOPIC (NOT AT [facility]) - Abnormal; Notable for the following:    Specific Gravity, Urine 1.038 (*)    Ketones, ur 15 (*)    All other components within normal limits  TROPONIN I  TROPONIN I  D-DIMER, QUANTITATIVE (NOT AT [facility])    EKG  EKG Interpretation  Date/Time:  Sunday April 04 2016 03:11:32 EST Ventricular Rate:  70 PR Interval:  152 QRS Duration: 98 QT Interval:  390 QTC Calculation: 421 R Axis:   75 Text Interpretation:  Normal sinus rhythm Nonspecific T wave abnormality similar to previous Abnormal ECG Confirmed by Manus GunningANCOUR  MD, Arayla Kruschke [phone]([id number]) on 04/04/2016 5:30:49 AM       Radiology Dg Chest 2 View  Result Date: 04/04/2016 CLINICAL DATA:  Chest pain for 3 days. EXAM: CHEST  2 VIEW COMPARISON:  03/07/2016 FINDINGS: Unchanged  cardiomegaly. Right aortic arch. The lungs are clear. No effusions. Normal pulmonary vasculature. IMPRESSION: No active cardiopulmonary disease. Electronically Signed   By: Ellery Plunk M.D.   On: 04/04/2016 03:56    Procedures Procedures (including critical care time)  Medications Ordered in ED Medications - No data to display   Initial Impression / Assessment and Plan / ED Course  I have reviewed the triage vital signs and the nursing notes.  Pertinent labs & imaging results that were available during my care of the patient were reviewed by me and considered in my medical decision making (see chart for details).  Clinical Course   41 f With history of heart disease presenting with left-sided chest pain progressively worsening. EKG is unchanged.  Exam is consistent with chest wall pain. X-ray is reassuring. Troponin drawn in triage is negative. Last echocardiogram reviewed which was reassuring. D-dimer negative.  BReast exam benign.  Exam consistent with chest wall pain.  Doubt ACS, PE, aortic dissection.  Treat with antiinflammatories. Followup with PCP. Return precautions  discussed.  Final Clinical Impressions(s) / ED Diagnoses   Final diagnoses:  Chest wall pain    New Prescriptions New Prescriptions   No medications on file     Glynn Octave, MD 04/04/16 1825

## 2017-11-22 IMAGING — CT CT ABD-PELV W/ CM
2 of 4 series · 14 of 46 positions shown, 16 images · IV contrast (Omni 300)
Comparison: Abdominal CT dated 07/25/2011 and ultrasound dated
07/28/2011

CLINICAL DATA: 20-year-old female with abdominal pain and bilateral
flank pain. History of UTIs.

EXAM:
CT ABDOMEN AND PELVIS WITH CONTRAST
TECHNIQUE: Multidetector CT imaging of the abdomen and pelvis was performed
using the standard protocol following bolus administration of
intravenous contrast.
CONTRAST:  100mL PC2LOP-YRR IOPAMIDOL (PC2LOP-YRR) INJECTION 61%

[Series 2: a/p w/ 5mm · axial · 0.56mm/px · z∈[-561,-141]mm · 11 of 94 slices shown, 13 images]
[im 5/94  soft-tissue]
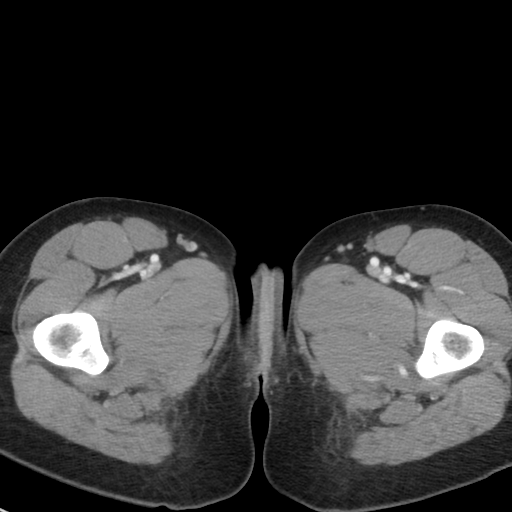
[im 5/94  bone]
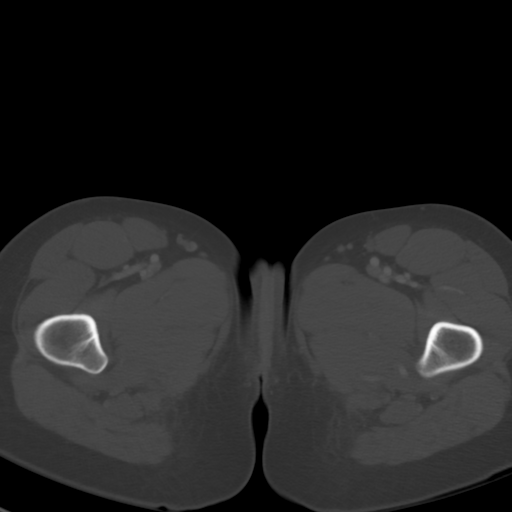
[im 15/94  soft-tissue]
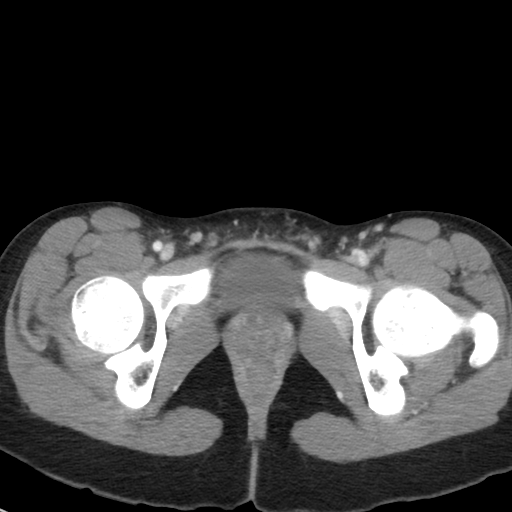
[im 25/94  soft-tissue]
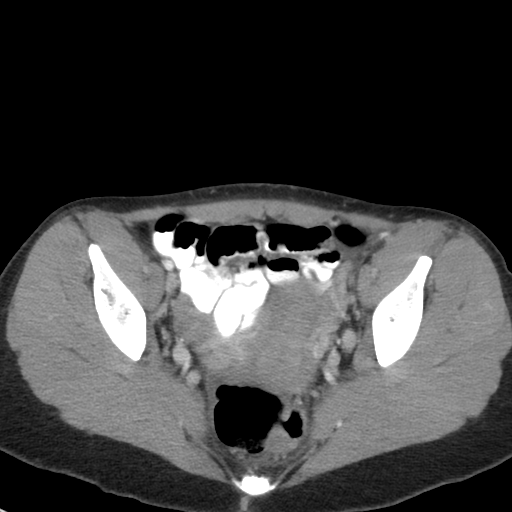
[im 30/94  soft-tissue]
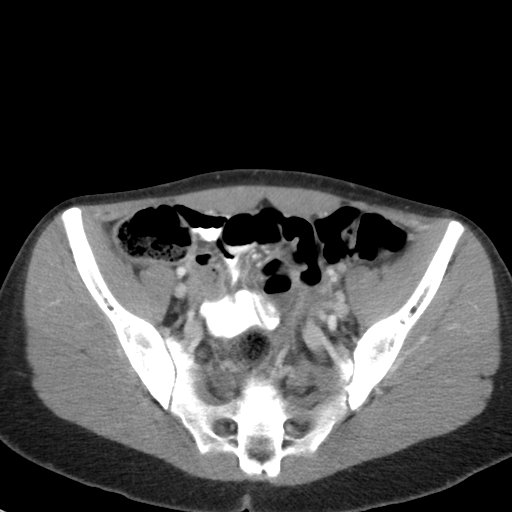
[im 40/94  soft-tissue]
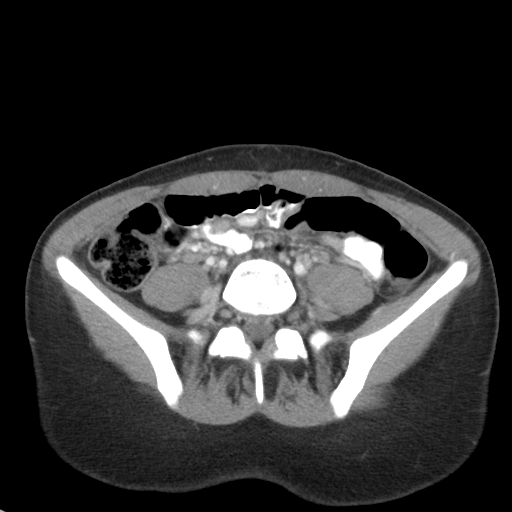
[im 49/94  soft-tissue]
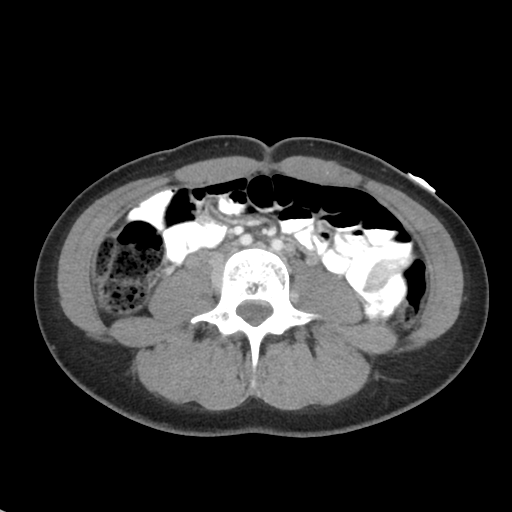
[im 54/94  soft-tissue]
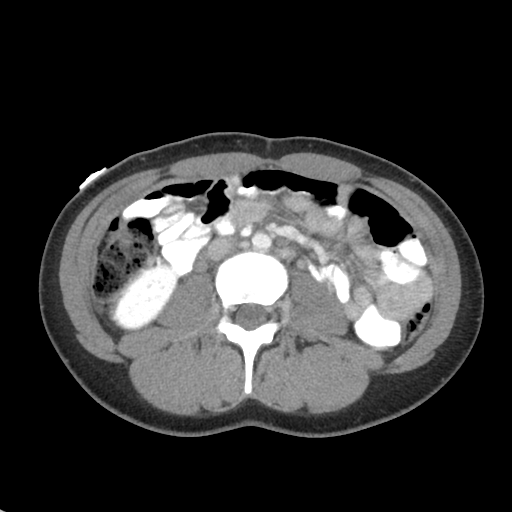
[im 64/94  soft-tissue]
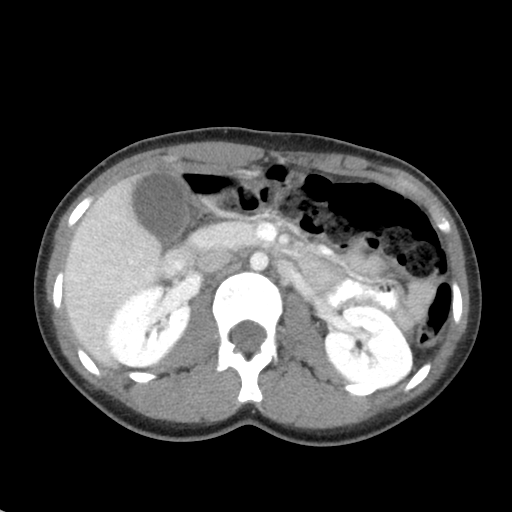
[im 69/94  soft-tissue]
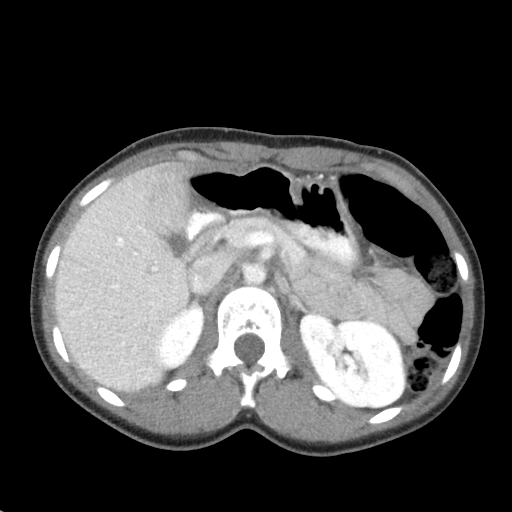
[im 69/94  bone]
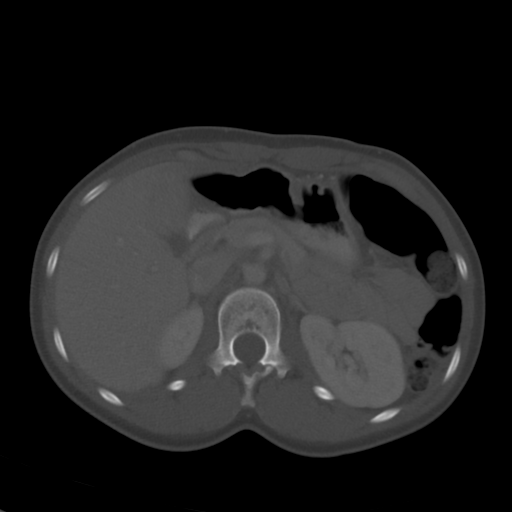
[im 79/94  soft-tissue]
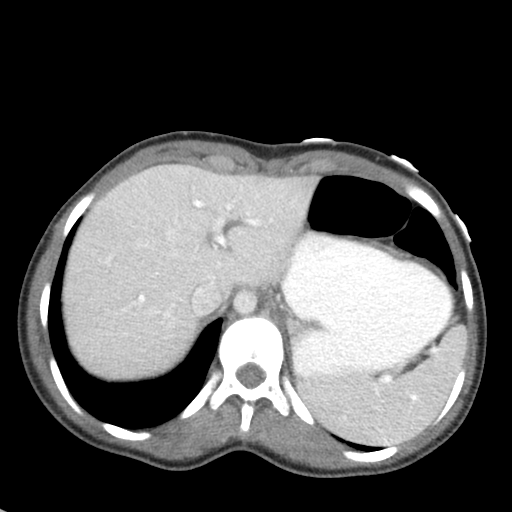
[im 89/94  soft-tissue]
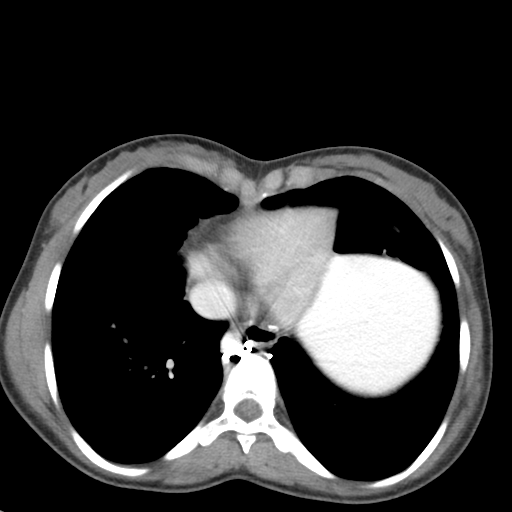

[Series 5: a/p w/ cor · coronal · 0.58mm/px · 3 of 104 slices shown]
[im 35/104  soft-tissue]
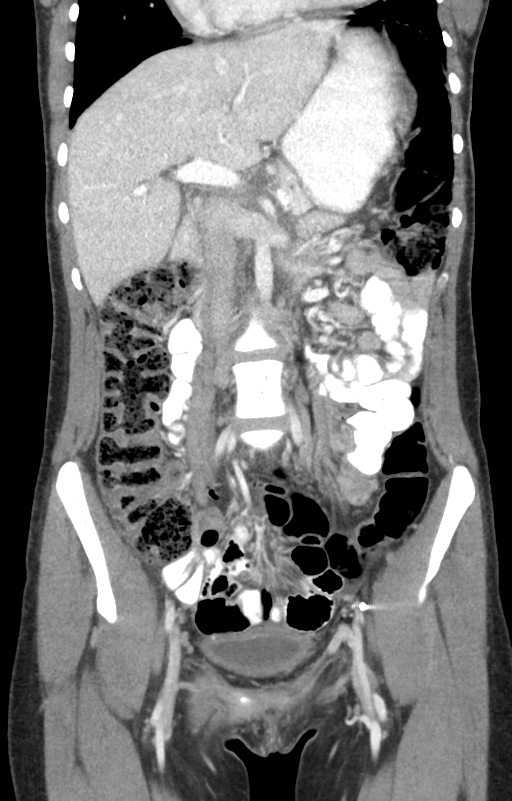
[im 46/104  soft-tissue]
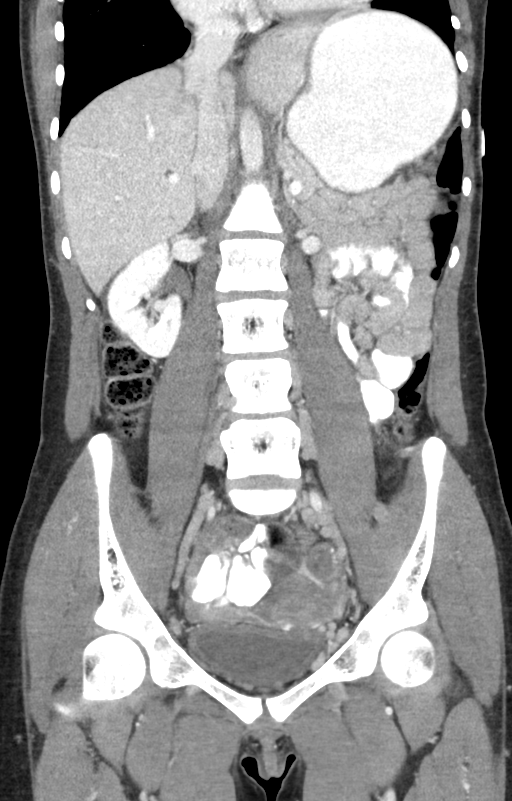
[im 58/104  soft-tissue]
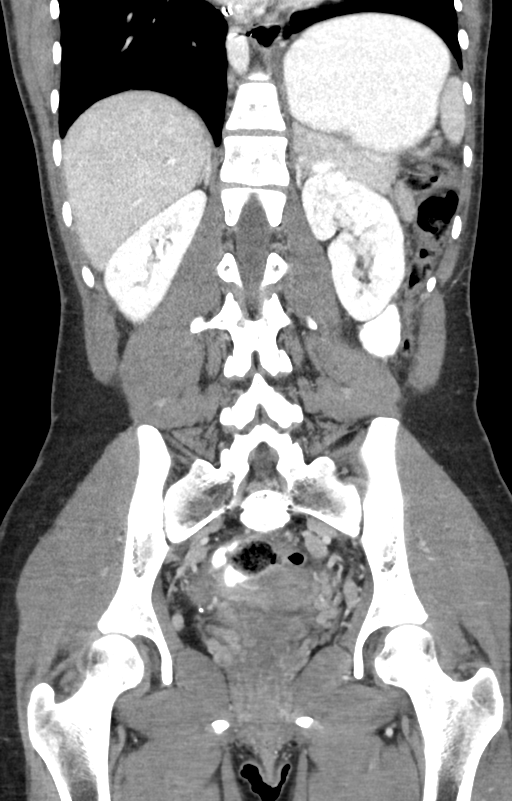

[14 of 46 positions shown; findings below may reference images not displayed]

FINDINGS: Lower chest: The visualized lung bases are clear. Mild eventration
of the left hemidiaphragm.

No intra-abdominal free air.  Small free fluid within the pelvis.

Hepatobiliary: There is mild haziness of the gallbladder wall. No
stone identified. Mild periportal edema may be present. There is
slight heterogeneity of the liver. A 9 x 8 mm hypo enhancing focus
at the dome of the liver (series 2, image 17 and coronal series 5
image 61) is not well characterized but may represent a flash
filling hemangioma or a portal venous shunting. MRI may provide
better characterisation.

Pancreas: Unremarkable. No pancreatic ductal dilatation or
surrounding inflammatory changes.

Spleen: Normal in size without focal abnormality.

Adrenals/Urinary Tract: Adrenal glands, kidneys, and the visualized
ureters appear unremarkable. There is apparent diffuse thickening of
the bladder wall which may be partly related to underdistention but
concerning for cystitis. Correlation with urinalysis recommended.

Stomach/Bowel: There is thickened appearance of the distal stomach
and gastric antrum concerning for gastritis. Correlation with
clinical exam recommended. Moderate stool noted throughout the
colon. There is no evidence of bowel obstruction. Normal appendix.

Vascular/Lymphatic: The abdominal aorta and IVC appear unremarkable.
The origins of the celiac axis, SMA, IMA as well as the origins of
the renal arteries are patent. The splenic vein, SMV, and main
portal vein are patent. No portal venous gas identified. There is
prominence of the pelvic vasculature. Clinical correlation is
recommended to evaluate for pelvic congestion syndrome. There is no
adenopathy.

Reproductive: The uterus is anteverted and grossly unremarkable.
There is a 1.5 cm collapsed left ovarian dominant follicle or corpus
luteum.

Other: There is cardiomegaly. Median sternotomy wire and pectus
excavatum deformity. Multiple surgical clips noted along the
posterior mediastinum and distal esophagus.

Musculoskeletal: No acute osseous pathology.
IMPRESSION: Mild thickened appearance of the distal stomach concerning for
gastritis. Clinical correlation is recommended. No evidence of bowel
obstruction. Normal appendix.

Minimal haziness of the gallbladder wall may be related to
inflammatory changes of the stomach. Cholecystitis is less likely.
No gallstone identified. Ultrasound may provide better evaluation of
the gallbladder.

Thickened appearance of the bladder wall. Correlation with
urinalysis recommended to exclude cystitis.

A 1.5 cm left ovarian collapsed corpus luteum or dominant follicle.

## 2017-11-22 IMAGING — CR DG CHEST 2V
2 series · 2 of 2 positions shown · non-contrast
Comparison: 01/28/2015

CLINICAL DATA: Low back pain. History of tetralogy of [place],
multiple cardiac surgeries.

EXAM:
CHEST  2 VIEW

[chest pa]
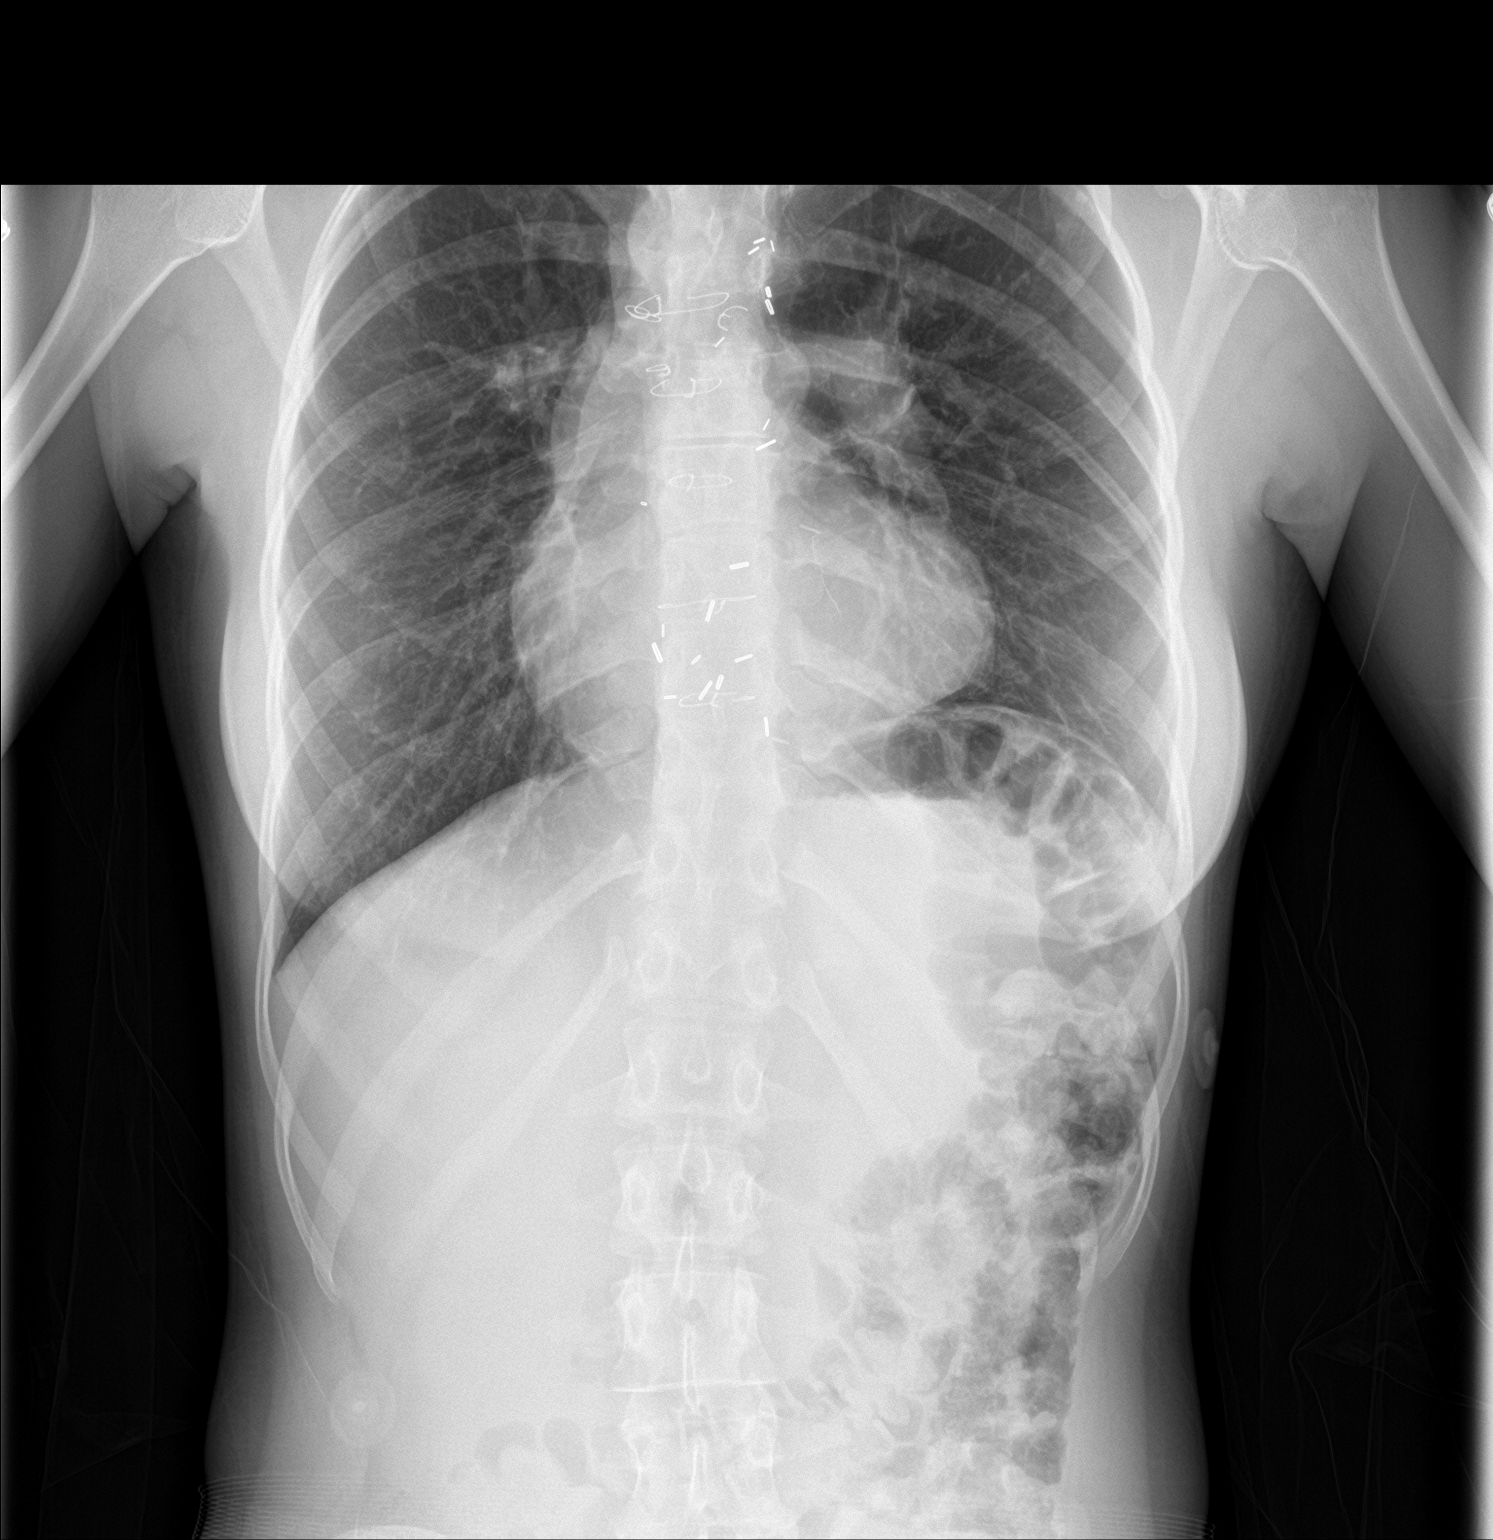

[chest lat]
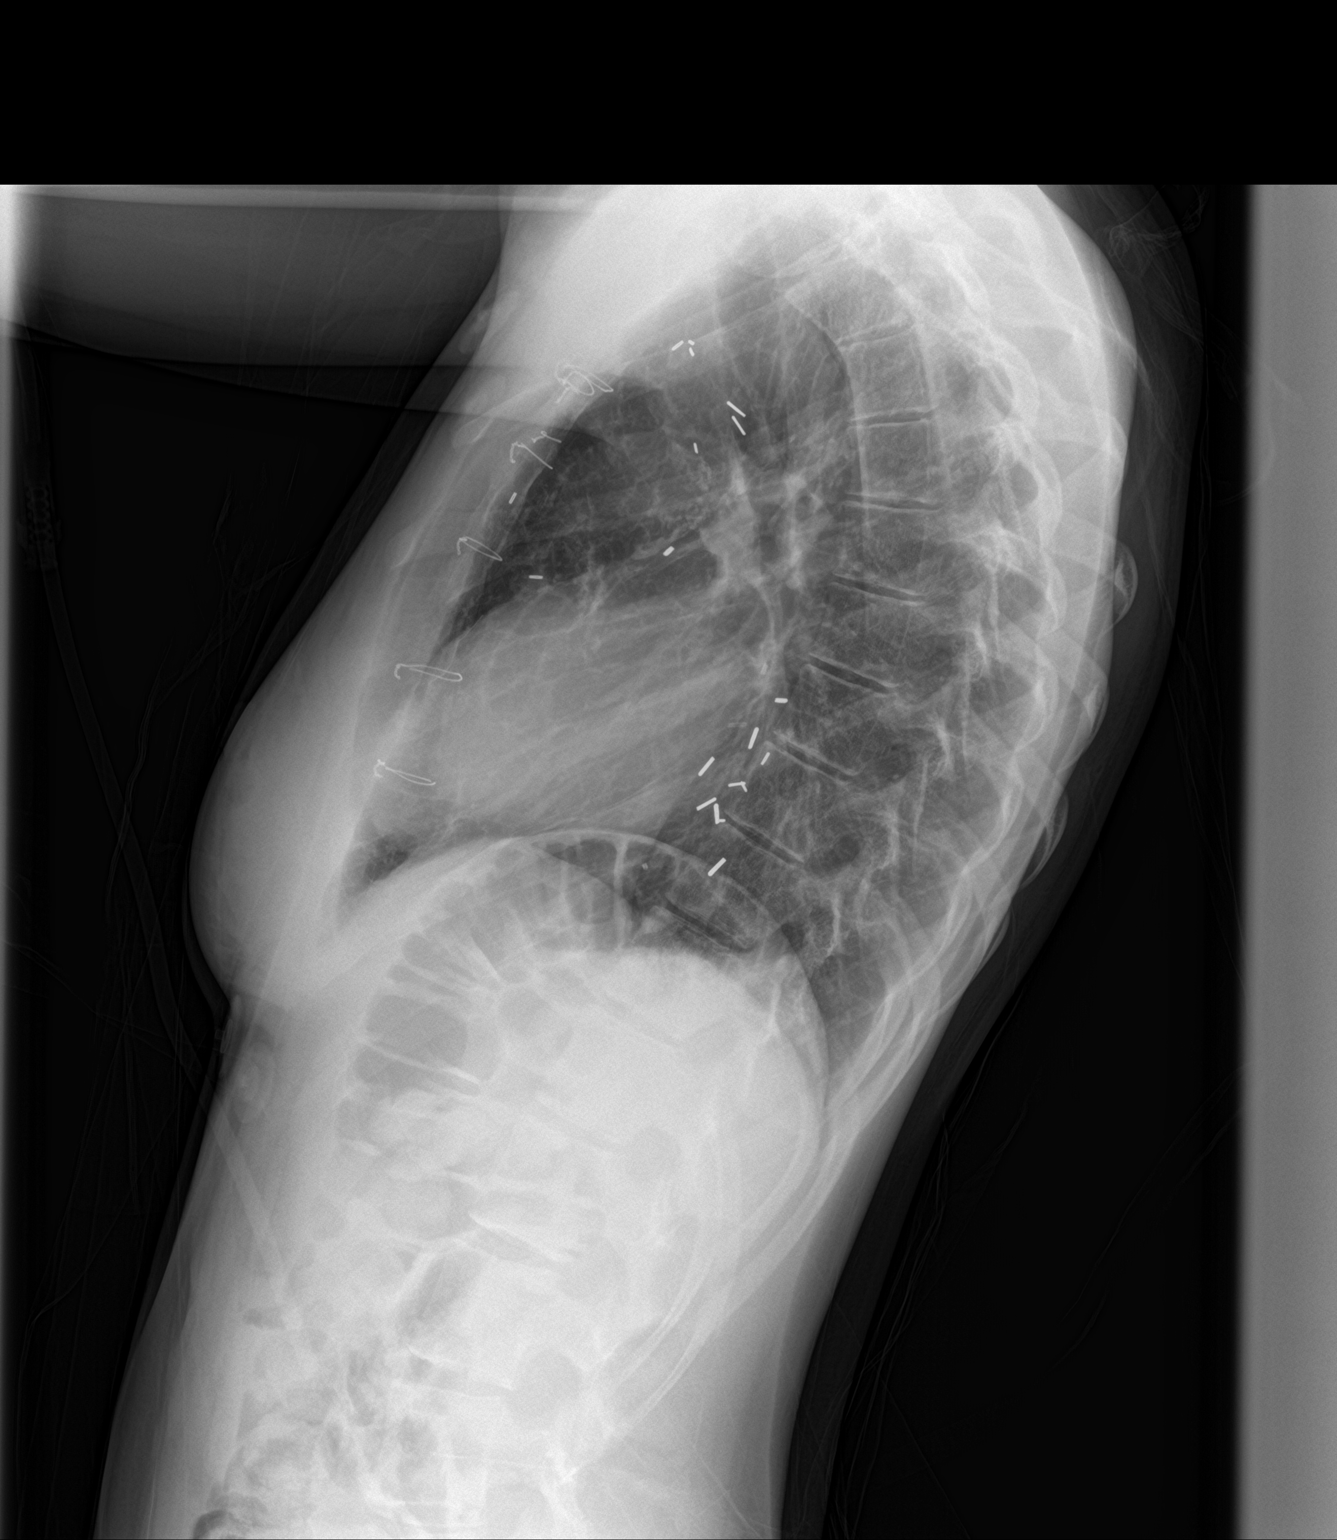

[2 of 2 positions shown; findings below may reference images not displayed]

FINDINGS: The heart is enlarged but stable. Mild prominence the pulmonary
vasculature especially the left is unchanged. Median sternotomy
sutures and surgical clips project over the cardiac and mediastinal
silhouette. The lungs are free of infiltrates. There is apical
pleural parenchymal thickening and minimal scarring. There is left
basilar atelectasis. No CHF. No pneumothorax. No acute osseous
abnormality.
IMPRESSION: Stable cardiomegaly.  No acute cardiopulmonary disease.

## 2018-02-17 ENCOUNTER — Ambulatory Visit: Payer: Federal, State, Local not specified - PPO | Admitting: Mental Health

## 2018-02-17 DIAGNOSIS — F3181 Bipolar II disorder: Secondary | ICD-10-CM | POA: Diagnosis not present

## 2018-02-17 NOTE — Progress Notes (Signed)
      [facility] Counselor/Therapist Progress Note   Patient ID: Alexis [name], MRN: [id number]  Date: 02/17/2018  Timespent: 45 minutes  Treatment Type: Individual  Subjective: Downhearted about relationship distancing, fatigued, wants to drop out of college. 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 Had decided to repress her emotions and not tell anyone how she is feeling, but decied that would not be helpful.  Interventions:CBT and Supportive  Mental Status Exam:   Appearance:   Well Groomed     Behavior:  Appropriate  Motor:  Normal  Speech/Language:   Normal Rate  Affect:  Appropriate  Mood:  anxious, irritable and sad  Thought process:  Coherent  Thought content:    WDL  Perceptual disturbances:    Normal  Orientation:  Full (Time, Place, and Person)  Attention:  Good  Concentration:  down slightly  Memory:  Immediate  Fund of knowledge:   Good  Insight:    Good  Judgment:   Good  Impulse Control:  good    Reported Symptoms: sad, discouraged, frustrated. Positive anticipations about going out of town next weekend to visit friend in [place].  Risk Assessment: Danger to Self:  No Self-injurious Behavior: No Danger to Others: No Duty to Warn:no Physical Aggression / Violence:No  Access to Firearms a concern: No  Gang Involvement:No   Diagnosis:   ICD-10-CM   1. Bipolar II disorder (HCC) F31.81      Plan: Comply with medications, obtain mood stability, obtain restful sleep, increase positive opportunities socially.  [name], [place]

## 2018-02-20 ENCOUNTER — Other Ambulatory Visit: Payer: Self-pay

## 2018-02-20 MED ORDER — QUETIAPINE FUMARATE ER 300 MG PO TB24: 300.0000 mg | ORAL_TABLET | Freq: Every day | ORAL | 0 refills | Status: DC

## 2018-02-27 ENCOUNTER — Ambulatory Visit: Payer: Federal, State, Local not specified - PPO | Admitting: Mental Health

## 2018-02-27 DIAGNOSIS — F3181 Bipolar II disorder: Secondary | ICD-10-CM | POA: Diagnosis not present

## 2018-02-27 NOTE — Progress Notes (Signed)
      [facility] Counselor/Therapist Progress Note   Patient ID: Alexis [name], MRN: [id number]  Date: 02/27/2018  Timespent: 45 minutes  Treatment Type: Individual Subjective: Discouraged and disappointed about relationship break up. Not sleeping well, poor appetite. Panic  attacks. Crying herself to sleep. 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 *    Interventions:CBT and Supportive  Mental Status Exam:   Appearance:   Well Groomed     Behavior:  Appropriate and Sharing  Motor:  Normal  Speech/Language:   Clear and Coherent  Affect:  Appropriate and Depressed  Mood:  anxious and sad  Thought process:  Coherent  Thought content:    WDL and Logical  Perceptual disturbances:    Normal  Orientation:  Full (Time, Place, and Person)  Attention:  Good  Concentration:  good  Memory:  Immediate  Fund of knowledge:   Good  Insight:    Good and Present  Judgment:   Good  Impulse Control:  good    Reported Symptoms:  Sad, anxious and having panic attacks; poor appetite;concersant; polite and cooperative; disappointed. Interrupted sleep; crying.  Risk Assessment: Danger to Self:  No Self-injurious Behavior: No Danger to Others: No Duty to Warn:no Physical Aggression / Violence:No  Access to Firearms a concern: No  Gang Involvement:No   Diagnosis: F32.9   Plan: Medication compliance; self care program including nutrition, restful sleep; exercise. Stabilize mood. Return to office in 2 weeks or less.  [name], [place]

## 2018-03-03 ENCOUNTER — Ambulatory Visit: Payer: Federal, State, Local not specified - PPO | Admitting: Mental Health

## 2018-03-13 ENCOUNTER — Ambulatory Visit: Payer: Federal, State, Local not specified - PPO | Admitting: Mental Health

## 2018-03-13 ENCOUNTER — Encounter: Payer: Self-pay | Admitting: Emergency Medicine

## 2018-03-13 DIAGNOSIS — F329 Major depressive disorder, single episode, unspecified: Secondary | ICD-10-CM

## 2018-03-13 DIAGNOSIS — F411 Generalized anxiety disorder: Secondary | ICD-10-CM | POA: Insufficient documentation

## 2018-03-14 ENCOUNTER — Ambulatory Visit: Payer: Federal, State, Local not specified - PPO | Admitting: Mental Health

## 2018-03-14 DIAGNOSIS — F411 Generalized anxiety disorder: Secondary | ICD-10-CM

## 2018-03-14 DIAGNOSIS — F331 Major depressive disorder, recurrent, moderate: Secondary | ICD-10-CM | POA: Diagnosis not present

## 2018-03-14 NOTE — Progress Notes (Signed)
      [facility] Counselor/Therapist Progress Note   Patient ID: Alexis [name], MRN: [id number]  Date: 03/14/2018  Timespent: 45 Minutes   Treatment Type: Individual   Reported Symptoms:  Improved depression, poor appetite, less social anxiety   Mental Status Exam:    Appearance:   Well Groomed     Behavior:  Appropriate  Motor:  Normal  Speech/Language:   Clear and Coherent  Affect:  Appropriate  Mood:  labile  Thought process:  normal  Thought content:    WNL  Sensory/Perceptual disturbances:    Some few visual hallucinations  Orientation:  oriented to person, place, time/date and situation  Attention:  Good  Concentration:  Good  Memory:  WNL  Fund of knowledge:   Good  Insight:    Good  Judgment:   Good  Impulse Control:  Good     Risk Assessment: Danger to Self:  No Self-injurious Behavior: No Danger to Others: No Duty to Warn:no Physical Aggression / Violence:No  Access to Firearms a concern: No  Gang Involvement:No    Subjective: Improved mood. Less despressiion. Increased social outreach. Doing better academically. Pleasant and cooperative.   Interventions: Cognitive Behavioral Therapy, Assertiveness/Communication and Interpersonal   Diagnosis:   ICD-10-CM   1. Major depressive disorder, recurrent episode, moderate (HCC) F33.1   2. Generalized anxiety disorder F41.1      Plan: Medication compliance           Continue self care program           Maintain nutritional diet           Stabilize mood           Return to office in 2 weeks                      [name], [facility]

## 2018-03-27 ENCOUNTER — Ambulatory Visit: Payer: Federal, State, Local not specified - PPO | Admitting: Physician Assistant

## 2018-03-27 ENCOUNTER — Encounter: Payer: Self-pay | Admitting: Physician Assistant

## 2018-03-27 DIAGNOSIS — F331 Major depressive disorder, recurrent, moderate: Secondary | ICD-10-CM

## 2018-03-27 DIAGNOSIS — F411 Generalized anxiety disorder: Secondary | ICD-10-CM | POA: Diagnosis not present

## 2018-03-27 MED ORDER — DULOXETINE HCL 60 MG PO CPEP: 60.0000 mg | ORAL_CAPSULE | ORAL | 1 refills | Status: DC

## 2018-03-27 MED ORDER — LORAZEPAM 0.5 MG PO TABS: 0.5000 mg | ORAL_TABLET | Freq: Two times a day (BID) | ORAL | 2 refills | Status: DC | PRN

## 2018-03-27 NOTE — Progress Notes (Signed)
[facility] Med Check  Patient ID: Alexis [name],  MRN: [id number]  PCP: Maryellen Pile, MD  Date of Evaluation: 03/27/2018 Time spent:15 minutes  Chief Complaint:  Chief Complaint    Follow-up      HISTORY/CURRENT STATUS: HPI For routine 6 week med check.  "I'm doing pretty good actually." States she's heard voices a few times but "not often. And it doesn't bother me.  I've one urge to cut but I didn't.  I'm pretty happy."  Patient denies loss of interest in usual activities and is able to enjoy things.  Denies decreased energy or motivation.  Appetite has not changed.  No extreme sadness, tearfulness, or feelings of hopelessness.  Denies any changes in concentration, making decisions or remembering things.  Denies suicidal or homicidal thoughts.  Patient denies increased energy with decreased need for sleep, no increased talkativeness, no racing thoughts, no impulsivity or risky behaviors, no increased spending, no increased libido, no grandiosity.  Anxiety is very well-controlled since starting Ativan.  She is taken 27 pills in 6 weeks.  States she only has 3 pills left and needs a refill.  Individual Medical History/ Review of Systems: Changes? :No    Past medications for mental health diagnoses include: Depakote, Abilify, Zyprexa, Paxil CR, Prozac, Wellbutrin, Lexapro, Zoloft, Lamictal, Vraylar, Xanax, Risperdal caused galactorrhea, [name] was too expensive  Allergies: Dopamine  Current Medications:  Current Outpatient Medications:  .  DULoxetine (CYMBALTA) 60 MG capsule, Take 1 capsule (60 mg total) by mouth every morning., Disp: 90 capsule, Rfl: 1 .  ibuprofen (ADVIL,MOTRIN) 800 MG tablet, Take 1 tablet (800 mg total) by mouth 3 (three) times daily., Disp: 21 tablet, Rfl: 0 .  LORazepam (ATIVAN) 0.5 MG tablet, Take 1 tablet (0.5 mg total) by mouth every 12 (twelve) hours as needed for anxiety. 1/2 - 1 po q12prn, Disp: 30 tablet, Rfl: 2 .  QUEtiapine (SEROQUEL XR) 300  MG 24 hr tablet, Take 1 tablet (300 mg total) by mouth at bedtime., Disp: 90 tablet, Rfl: 0 .  promethazine (PHENERGAN) 12.5 MG tablet, Take 1 tablet (12.5 mg total) by mouth every 6 (six) hours as needed for nausea or vomiting. (Patient not taking: Reported on 03/27/2018), Disp: 30 tablet, Rfl: 6 Medication Side Effects: none  Family Medical/ Social History: Changes?  She and her boyfriend broke up  MENTAL HEALTH EXAM:  There were no vitals taken for this visit.There is no height or weight on file to calculate BMI.  General Appearance: Casual  Eye Contact:  Good  Speech:  Clear and Coherent  Volume:  Normal  Mood:  Euthymic  Affect:  Appropriate  Thought Process:  Goal Directed  Orientation:  Full (Time, Place, and Person)  Thought Content: Logical   Suicidal Thoughts:  No  Homicidal Thoughts:  No  Memory:  WNL  Judgement:  Good  Insight:  Good  Psychomotor Activity:  Normal  Concentration:  Concentration: Good  Recall:  Good  Fund of Knowledge: Good  Language: Good  Assets:  Desire for Improvement  ADL's:  Intact  Cognition: WNL  Prognosis:  Good    DIAGNOSES:    ICD-10-CM   1. Generalized anxiety disorder F41.1   2. Major depressive disorder, recurrent episode, moderate (HCC) F33.1     Receiving Psychotherapy: Yes With [name], LPC   RECOMMENDATIONS: I am glad to see her doing so well! Continue all current medications. Continue psychotherapy with [name], LPC Return in 2 months    Melony Overly, [place]

## 2018-03-28 ENCOUNTER — Ambulatory Visit: Payer: Federal, State, Local not specified - PPO | Admitting: Mental Health

## 2018-04-06 ENCOUNTER — Ambulatory Visit: Payer: Federal, State, Local not specified - PPO | Admitting: Mental Health

## 2018-04-07 ENCOUNTER — Encounter: Payer: Self-pay | Admitting: Mental Health

## 2018-04-07 ENCOUNTER — Ambulatory Visit: Payer: Federal, State, Local not specified - PPO | Admitting: Mental Health

## 2018-04-07 DIAGNOSIS — F3181 Bipolar II disorder: Secondary | ICD-10-CM

## 2018-04-07 NOTE — Progress Notes (Signed)
      [facility] Counselor/Therapist Progress Note  Patient ID: Alexis [name], MRN: [id number],    Date: 04/07/2018  Time Spent: 45 minutes   Treatment Type: Individual Therapy  Reported Symptoms: Has not been very depressed until she began to have friendship with negative person. Also found herself more anxious at times.  Mental Status Exam:  Appearance:   Casual     Behavior:  Appropriate  Motor:  Normal  Speech/Language:   Clear and Coherent  Affect:  negative  Mood:  anxious, depressed and labile  Thought process:  normal  Thought content:    Rumination  Sensory/Perceptual disturbances:    WNL  Orientation:  oriented to person, place and time/date  Attention:  Good  Concentration:  Good  Memory:  WNL  Fund of knowledge:   Good  Insight:    Good  Judgment:   variable  Impulse Control:  Good   Risk Assessment: Danger to Self:  No Self-injurious Behavior: No Danger to Others: Nono Duty to Warn:no Physical Aggression / Violence:No  Access to Firearms a concern: No  Gang Involvement:No  Subjective: Much more socially active. Has one friend who is very negative that makes her anxious. Is doing greatly better. No desire to cut. Not depressed.                      Doing well academically. Has decided what she wants to do after college.   Interventions: Cognitive Behavioral Therapy, Solution-Oriented/Positive Psychology, Interpersonal and supportive  Diagnosis:   ICD-10-CM   1. Bipolar II disorder (HCC) F31.81     Plan:  Maintain medication compliance            Maintain mood stability            Self care program including restful sleep            Increase social outreach               [place] Courney Garrod, [place]

## 2018-04-21 ENCOUNTER — Ambulatory Visit: Payer: Federal, State, Local not specified - PPO | Admitting: Mental Health

## 2018-05-15 ENCOUNTER — Other Ambulatory Visit: Payer: Self-pay | Admitting: Physician Assistant

## 2018-05-15 MED ORDER — QUETIAPINE FUMARATE ER 300 MG PO TB24: 300.0000 mg | ORAL_TABLET | Freq: Every day | ORAL | 1 refills | Status: DC

## 2018-05-24 ENCOUNTER — Encounter: Payer: Self-pay | Admitting: Physician Assistant

## 2018-05-24 ENCOUNTER — Ambulatory Visit: Payer: Federal, State, Local not specified - PPO | Admitting: Physician Assistant

## 2018-05-24 DIAGNOSIS — F411 Generalized anxiety disorder: Secondary | ICD-10-CM

## 2018-05-24 DIAGNOSIS — F331 Major depressive disorder, recurrent, moderate: Secondary | ICD-10-CM | POA: Diagnosis not present

## 2018-05-24 MED ORDER — LORAZEPAM 0.5 MG PO TABS: 0.5000 mg | ORAL_TABLET | Freq: Two times a day (BID) | ORAL | 3 refills | Status: DC | PRN

## 2018-05-24 NOTE — Progress Notes (Signed)
[facility]  Patient ID: Alexis [name],  MRN: [id number]  PCP: Maryellen Pile, MD  Date of Evaluation: 05/24/2018 Time spent:15 minutes  Chief Complaint:  Chief Complaint    Follow-up      HISTORY/CURRENT STATUS: HPI Here for 2 month med check.  Doing great!  Has a new boyfriend and is very happy. Feels that meds are working well.   Patient denies loss of interest in usual activities and is able to enjoy things.  Denies decreased energy or motivation.  Appetite has not changed.  No extreme sadness, tearfulness, or feelings of hopelessness.  Denies any changes in concentration, making decisions or remembering things.  Denies suicidal or homicidal thoughts. No cutting.  Patient denies increased energy with decreased need for sleep, no increased talkativeness, no racing thoughts, no impulsivity or risky behaviors, no increased spending, no increased libido, no grandiosity.  Anxiety is controlled.  She sleeps well.  Starts back to school next week.  Denies polyuria, polydipsia.  Individual Medical History/ Review of Systems: Changes? :No   Allergies: Dopamine  Current Medications:  Current Outpatient Medications:  .  DULoxetine (CYMBALTA) 60 MG capsule, Take 1 capsule (60 mg total) by mouth every morning., Disp: 90 capsule, Rfl: 1 .  ibuprofen (ADVIL,MOTRIN) 800 MG tablet, Take 1 tablet (800 mg total) by mouth 3 (three) times daily., Disp: 21 tablet, Rfl: 0 .  LORazepam (ATIVAN) 0.5 MG tablet, Take 1 tablet (0.5 mg total) by mouth every 12 (twelve) hours as needed for anxiety. 1/2 - 1 po q12prn, Disp: 30 tablet, Rfl: 2 .  QUEtiapine (SEROQUEL XR) 300 MG 24 hr tablet, Take 1 tablet (300 mg total) by mouth at bedtime., Disp: 90 tablet, Rfl: 1 Medication Side Effects: none  Family Medical/ Social History: Changes? No  MENTAL HEALTH EXAM:  There were no vitals taken for this visit.There is no height or weight on file to calculate BMI.  General Appearance: Casual and  Well Groomed  Eye Contact:  Good  Speech:  Clear and Coherent  Volume:  Normal  Mood:  Euthymic  Affect:  Appropriate  Thought Process:  Goal Directed  Orientation:  Full (Time, Place, and Person)  Thought Content: Logical   Suicidal Thoughts:  No  Homicidal Thoughts:  No  Memory:  WNL  Judgement:  Good  Insight:  Good  Psychomotor Activity:  Normal  Concentration:  Concentration: Good  Recall:  Good  Fund of Knowledge: Good  Language: Good  Assets:  Desire for Improvement  ADL's:  Intact  Cognition: WNL  Prognosis:  Good    DIAGNOSES:    ICD-10-CM   1. Generalized anxiety disorder F41.1   2. Major depressive disorder, recurrent episode, moderate (HCC) F33.1     Receiving Psychotherapy: Yes    RECOMMENDATIONS: Continue Seroquel X are 300 mg 1 p.o. nightly. Continue Ativan 0.5 mg 1 every 12 H PRN. Continue Cymbalta 60 mg every morning. Continue psychotherapy with [name], LPC. We will need to order labs at next visit.  She may be getting them through her cardiologist but I do not have those records.  I will discuss at next visit. Return in 4 months or sooner as needed.   Melony Overly, PA-C

## 2018-06-15 ENCOUNTER — Ambulatory Visit: Payer: Federal, State, Local not specified - PPO | Admitting: Mental Health

## 2018-06-15 ENCOUNTER — Encounter: Payer: Self-pay | Admitting: Mental Health

## 2018-06-15 DIAGNOSIS — F319 Bipolar disorder, unspecified: Secondary | ICD-10-CM

## 2018-06-15 NOTE — Progress Notes (Signed)
      [facility] Counselor/Therapist Progress Note  Patient ID: Alexis [name], MRN: [id number],    Date: 06/15/2018  Time Spent: 45 minues   Treatment Type: Individual Therapy  Reported Symptoms: Some stress related to relationship with Mother. Was tempted to cut but did not act on it. Not sleeping well. Feels overwhelmed with homework.  Mental Status Exam:  Appearance:   Well Groomed     Behavior:  Appropriate  Motor:  Normal  Speech/Language:   Normal Rate  Affect:  upbeat  Mood:  euthymic  Thought process:  normal  Thought content:    WNL  Sensory/Perceptual disturbances:    WNL  Orientation:  oriented to person, place and time/date  Attention:  Good  Concentration:  Good  Memory:  WNL  Fund of knowledge:   Good  Insight:    Good  Judgment:   Good  Impulse Control:  Good   Risk Assessment: Danger to Self:  No Self-injurious Behavior: No Danger to Others: No Duty to Warn:no Physical Aggression / Violence:No  Access to Firearms a concern: No  Gang Involvement:No   Subjective: Has not smoked pot or cut since August. Has a new relationship with  A guy who is a stable and positive influence. Has had some conflict with mother. School is going well, Mood stable.  Interventions: Cognitive Behavioral Therapy, Solution-Oriented/Positive Psychology, Insight-Oriented and Interpersonal  Diagnosis:   ICD-10-CM   1. Depressed bipolar I disorder (HCC) F31.9     Plan:  Medication compliance            Maintain mood stability            Self care program, including restful sleep.            Increase appetite            Validation and support  [name], [facility]

## 2018-06-26 DIAGNOSIS — J00 Acute nasopharyngitis [common cold]: Secondary | ICD-10-CM | POA: Diagnosis not present

## 2018-06-29 ENCOUNTER — Ambulatory Visit: Payer: Federal, State, Local not specified - PPO | Admitting: Mental Health

## 2018-07-05 ENCOUNTER — Other Ambulatory Visit: Payer: Self-pay

## 2018-07-05 ENCOUNTER — Telehealth: Payer: Self-pay | Admitting: Physician Assistant

## 2018-07-05 MED ORDER — DULOXETINE HCL 30 MG PO CPEP: 30.0000 mg | ORAL_CAPSULE | Freq: Every day | ORAL | 0 refills | Status: DC

## 2018-07-05 NOTE — Telephone Encounter (Signed)
Provider suggest increase cymbalta 90 mg/day and ok to increase ativan up to tid prn anxiety. Pt verbalized understanding. Instructed to call back if not better.

## 2018-07-05 NOTE — Telephone Encounter (Signed)
Pt wants to knows if she can increase her dosage on ativan or cymbalta.

## 2018-07-13 ENCOUNTER — Encounter: Payer: Self-pay | Admitting: Mental Health

## 2018-07-13 ENCOUNTER — Ambulatory Visit: Payer: Federal, State, Local not specified - PPO | Admitting: Mental Health

## 2018-07-13 DIAGNOSIS — F319 Bipolar disorder, unspecified: Secondary | ICD-10-CM

## 2018-07-13 NOTE — Progress Notes (Signed)
      [facility] Counselor/Therapist Progress Note  Patient ID: Alexis [name], MRN: [id number],    Date: 07/13/2018  Time Spent: 45 minutes  Treatment Type: Individual Therapy  Reported Symptoms: Depressed, traumatized by death of pet bird, Discouraged about school. Some issues with Mother. Has good relationship with [name]. Has not slept well Overstressed about school..  Mental Status Exam:  Appearance:   Well Groomed     Behavior:  Appropriate and Sharing  Motor:  Normal  Speech/Language:   Normal Rate  Affect:  Depressed and Tearful  Mood:  depressed and sad  Thought process:  normal  Thought content:    WNL  Sensory/Perceptual disturbances:    WNL  Orientation:  oriented to person, place and time/date  Attention:  Good  Concentration:  Fair  Memory:  WNL  Fund of knowledge:   Good  Insight:    Fair  Judgment:   Good  Impulse Control:  Fair   Risk Assessment: Danger to Self:  No Self-injurious Behavior: No Danger to Others: No Duty to Warn:no Physical Aggression / Violence:No  Access to Firearms a concern: No  Gang Involvement:No   Subjective:  School has been stressful. Had a good Valentines with [name]. Happy about relationship but also anxious about school and sad about loss of "Pumpkin." Has positive anticipations and able to enjoy. Father ha conference call with all his daughters on Wednesday nights and looks forward to that.   Interventions: Cognitive Behavioral Therapy, Solution-Oriented/Positive Psychology, Insight-Oriented, Family Systems and Interpersonal  Diagnosis:   ICD-10-CM   1. Depressed bipolar I disorder (HCC) F31.9     Plan:   Restore restful sleep             Maintain mood stability             Self care program             Grief recovery             Time Management             Validation and support  [name], [facility]

## 2018-07-14 DIAGNOSIS — Z713 Dietary counseling and surveillance: Secondary | ICD-10-CM | POA: Diagnosis not present

## 2018-07-14 DIAGNOSIS — Z7189 Other specified counseling: Secondary | ICD-10-CM | POA: Diagnosis not present

## 2018-07-14 DIAGNOSIS — Z6824 Body mass index (BMI) 24.0-24.9, adult: Secondary | ICD-10-CM | POA: Diagnosis not present

## 2018-07-14 DIAGNOSIS — Z0000 Encounter for general adult medical examination without abnormal findings: Secondary | ICD-10-CM | POA: Diagnosis not present

## 2018-07-18 DIAGNOSIS — Z0000 Encounter for general adult medical examination without abnormal findings: Secondary | ICD-10-CM | POA: Diagnosis not present

## 2018-07-18 DIAGNOSIS — R5383 Other fatigue: Secondary | ICD-10-CM | POA: Diagnosis not present

## 2018-07-19 DIAGNOSIS — M25571 Pain in right ankle and joints of right foot: Secondary | ICD-10-CM | POA: Diagnosis not present

## 2018-07-28 ENCOUNTER — Other Ambulatory Visit: Payer: Self-pay | Admitting: Physician Assistant

## 2018-08-02 DIAGNOSIS — M25571 Pain in right ankle and joints of right foot: Secondary | ICD-10-CM | POA: Diagnosis not present

## 2018-08-03 ENCOUNTER — Ambulatory Visit: Payer: Federal, State, Local not specified - PPO | Admitting: Mental Health

## 2018-08-19 ENCOUNTER — Other Ambulatory Visit: Payer: Self-pay | Admitting: Physician Assistant

## 2018-09-08 ENCOUNTER — Other Ambulatory Visit: Payer: Self-pay

## 2018-09-08 ENCOUNTER — Ambulatory Visit (INDEPENDENT_AMBULATORY_CARE_PROVIDER_SITE_OTHER): Payer: Federal, State, Local not specified - PPO | Admitting: Mental Health

## 2018-09-08 ENCOUNTER — Encounter: Payer: Self-pay | Admitting: Mental Health

## 2018-09-08 DIAGNOSIS — F319 Bipolar disorder, unspecified: Secondary | ICD-10-CM

## 2018-09-08 NOTE — Progress Notes (Addendum)
[facility] Counselor/Therapist Progress Note  Patient ID: Alexis [name], MRN: [id number],    Date: 09/08/2018   I connected with patient by a video enabled telemedicine application or telephone, with their informed consent, and verified patient privacy and that I am speaking with the correct person using two identifiers.  I was located at home and patient at home.   Time Spent: 42 minutes  Treatment Type: Individual Therapy  Reported Symptoms: Severe depression, high anxiety, stress  Mental Status Exam:  Appearance:   unseen     Behavior:  Appropriate and Sharing  Motor:  Restlestness as reported  Speech/Language:   Clear and Coherent  Affect:  Depressed  Mood:  anxious, depressed and labile  Thought process:  goal directed  Thought content:    WNL  Sensory/Perceptual disturbances:    WNL  Orientation:  oriented to person, place and time/date  Attention:  Good  Concentration:  Good  Memory:  WNL  Fund of knowledge:   Good  Insight:    Good  Judgment:   Good  Impulse Control:  Good   Risk Assessment: Danger to Self:  No Self-injurious Behavior: No Danger to Others: No Duty to Warn:no Physical Aggression / Violence:No  Access to Firearms a concern: No  Gang Involvement:No   Subjective: Having extreme anxiety and urge to cut. Has not acted on it. Severely depressed and unable to see boyfriend because of Pandemic. 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 Encouraged not to wait for TH appt in 2 weeks but to go ahead and call to make medication adjustments now. Will check on her next week.  Interventions: Solution-Oriented/Positive Psychology, Insight-Oriented, Interpersonal and supportie  Diagnosis:   ICD-10-CM   1. Depressed bipolar I disorder (HCC) F31.9   2. Bipolar I disorder [facility](HCC) F31.9       Treatment Plan    Patient Name:  Alexis [name]  Date: September 08, 2018   Didactic topic to be discussed:          Anxiety:                   Locus of control                              Work/Life balance           Depression                             Problem-solving                              Relationships                                   Boundaries                                     Coping srategies                             Communication  Recovery from trauma                    Self-care                                     Validation  Other     Goals:  Patient  1. Maintains mood stabiity:  decreased symptoms of     depression     anxiety  2.   Practices pro-active self-care:   restful sleep, nutrition, exercise, socialization  3.   Effective utilizes boundaries and sets limits  4.   Utliizes coping strategies and problem solving techniques for stress management  5.   Feels accurately heard, understood and validated  Other: Self calming techniques      Tenny Craw [facility]     [name], [facility]

## 2018-09-11 ENCOUNTER — Encounter: Payer: Self-pay | Admitting: Mental Health

## 2018-09-11 ENCOUNTER — Other Ambulatory Visit: Payer: Self-pay

## 2018-09-11 ENCOUNTER — Ambulatory Visit (INDEPENDENT_AMBULATORY_CARE_PROVIDER_SITE_OTHER): Payer: Federal, State, Local not specified - PPO | Admitting: Mental Health

## 2018-09-11 DIAGNOSIS — F319 Bipolar disorder, unspecified: Secondary | ICD-10-CM

## 2018-09-11 NOTE — Progress Notes (Addendum)
[facility] Counselor/Therapist Progress Note  Patient ID: Alexis [name], MRN: [id number],    Date: 09/11/2018  Time Spent: 50 minutes   I connected with patient by a video enabled telemedicine application or telephone, with their informed consent, and verified patient privacy and that I am speaking with the correct person using two identifiers.  I was located at home and patient at home. Corona Virus Pandemic. 9:00 AM.   Treatment Type: Individual Therapy  Reported Symptoms: Reports she is extremely anxious and deeply depressed. Has been confinend throughout this pandemic and feeling restless. Sleeping okay. Appetite okay. Reports she has been moody and at time has had passive suicidal ideation but no plan or intent.  Mental Status Exam:  Appearance:   unseen - telephone session     Behavior:  Appropriate, Sharing and Agitated  Motor:  Restlestness as reported  Speech/Language:   Normal Rate  Affect:  Depressed  Mood:  anxious and depressed  Thought process:  normal  Thought content:    WNL  Sensory/Perceptual disturbances:    WNL  Orientation:  oriented to person, place and time/date  Attention:  Good  Concentration:  Good  Memory:  WNL  Fund of knowledge:   Good  Insight:    Good  Judgment:   Good  Impulse Control:  Good   Risk Assessment: Danger to Self:  No Self-injurious Behavior: No Danger to Others: No Duty to Warn:no Physical Aggression / Violence:No  Access to Firearms a concern: No  Gang Involvement:No   Subjective: Has been unable to see boyfriend during confinement and has felt restless and lonely. Living with grandmother who gets her to do bowling on [place]. Has pet birds and one bird hatched from an egg there. Has appt with TH on Thursday to update meds. 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   Interventions: Solution-Oriented/Positive Psychology, Insight-Oriented, Interpersonal and  supportive  Diagnosis:   ICD-10-CM   1. Bipolar I disorder (HCC) F31.9     .  Treatment Plan   Patient Name:  Alexis Sterba   Date: September 11, 2018   Didactic topic to be discussed:           Anxiety:                   Locus of control                              Work/Life balance           Depression                             Problem-solving                              Relationships                                   Boundaries                                     Coping srategies  Communication                    Recovery from trauma                    Self-care                                     Validation  Other     Goals:  Patient  1. Maintains mood stabiity:  decreased symptoms of     depression     anxiety  2.   Practices pro-active self-care:   restful sleep, nutrition, exercise, socialization  3.   Effective utilizes boundaries and sets limits  4.   Utliizes coping strategies and problem solving techniques for stress management  5.   Feels accurately heard, understood and validated  Other      Logan Bores [place], [facility]

## 2018-09-14 ENCOUNTER — Ambulatory Visit (INDEPENDENT_AMBULATORY_CARE_PROVIDER_SITE_OTHER): Payer: Federal, State, Local not specified - PPO | Admitting: Physician Assistant

## 2018-09-14 ENCOUNTER — Encounter: Payer: Self-pay | Admitting: Physician Assistant

## 2018-09-14 ENCOUNTER — Other Ambulatory Visit: Payer: Self-pay

## 2018-09-14 DIAGNOSIS — R443 Hallucinations, unspecified: Secondary | ICD-10-CM | POA: Diagnosis not present

## 2018-09-14 DIAGNOSIS — F319 Bipolar disorder, unspecified: Secondary | ICD-10-CM

## 2018-09-14 DIAGNOSIS — F411 Generalized anxiety disorder: Secondary | ICD-10-CM | POA: Diagnosis not present

## 2018-09-14 MED ORDER — LORAZEPAM 0.5 MG PO TABS: 0.5000 mg | ORAL_TABLET | Freq: Three times a day (TID) | ORAL | 1 refills | Status: DC | PRN

## 2018-09-14 MED ORDER — DULOXETINE HCL 60 MG PO CPEP: 60.0000 mg | ORAL_CAPSULE | ORAL | 1 refills | Status: DC

## 2018-09-14 MED ORDER — QUETIAPINE FUMARATE ER 300 MG PO TB24: 600.0000 mg | ORAL_TABLET | Freq: Every day | ORAL | 1 refills | Status: DC

## 2018-09-14 NOTE — Addendum Note (Signed)
Addended by: Melony Overly T on: 09/14/2018 03:38 PM   Modules accepted: Orders
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
## 2018-09-25 ENCOUNTER — Ambulatory Visit: Payer: Federal, State, Local not specified - PPO | Admitting: Mental Health

## 2018-09-25 ENCOUNTER — Other Ambulatory Visit: Payer: Self-pay

## 2018-09-27 ENCOUNTER — Ambulatory Visit: Payer: Federal, State, Local not specified - PPO | Admitting: Physician Assistant

## 2018-10-05 ENCOUNTER — Ambulatory Visit: Payer: Federal, State, Local not specified - PPO | Admitting: Physician Assistant

## 2018-10-16 ENCOUNTER — Other Ambulatory Visit: Payer: Self-pay

## 2018-10-16 ENCOUNTER — Ambulatory Visit (INDEPENDENT_AMBULATORY_CARE_PROVIDER_SITE_OTHER): Payer: Federal, State, Local not specified - PPO | Admitting: Mental Health

## 2018-10-16 ENCOUNTER — Encounter: Payer: Self-pay | Admitting: Mental Health

## 2018-10-16 DIAGNOSIS — F319 Bipolar disorder, unspecified: Secondary | ICD-10-CM
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
## 2018-10-29 ENCOUNTER — Other Ambulatory Visit: Payer: Self-pay | Admitting: Physician Assistant

## 2018-11-06 ENCOUNTER — Ambulatory Visit: Payer: Federal, State, Local not specified - PPO | Admitting: Mental Health

## 2018-11-09 ENCOUNTER — Telehealth: Payer: Self-pay | Admitting: Physician Assistant

## 2018-11-09 ENCOUNTER — Other Ambulatory Visit: Payer: Self-pay | Admitting: Physician Assistant

## 2018-11-09 MED ORDER — OLANZAPINE 5 MG PO TABS: ORAL_TABLET | ORAL | 1 refills | Status: DC

## 2018-11-09 MED ORDER — LORAZEPAM 0.5 MG PO TABS: 0.5000 mg | ORAL_TABLET | Freq: Three times a day (TID) | ORAL | 1 refills | Status: DC | PRN

## 2018-11-09 NOTE — Telephone Encounter (Signed)
Please triage.  May need to increase Seroquel.

## 2018-11-09 NOTE — Telephone Encounter (Signed)
Pt left v-mail. Need refill on Seroquel  90 day supply.

## 2018-11-09 NOTE — Telephone Encounter (Signed)
Pt called to advise she is not feeling so well. Meds not working as before. 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 If no appt avaliaible in office talk on phone to you. Can talk 12-1 or after 4:30 daily. You don't have appt at this time. Feels she is drowning. [phone]

## 2018-11-09 NOTE — Telephone Encounter (Signed)
Spoke with pharmacist and she has several refills already on file for seroquel. Just needs to contact pharmacy

## 2018-11-09 NOTE — Telephone Encounter (Signed)
The chart shows she's on Seroquel XR 600 mg!  Not 1200.  Not supposed to be on that much. 800mg  is max. Have her go back to that.  And add Zyprexa. Will send in.

## 2018-11-10 ENCOUNTER — Other Ambulatory Visit: Payer: Self-pay | Admitting: Physician Assistant

## 2018-11-10 MED ORDER — QUETIAPINE FUMARATE ER 400 MG PO TB24: 400.0000 mg | ORAL_TABLET | Freq: Every day | ORAL | 1 refills | Status: DC

## 2018-11-10 MED ORDER — OLANZAPINE 10 MG PO TABS: ORAL_TABLET | ORAL | 0 refills | Status: DC

## 2018-11-10 NOTE — Telephone Encounter (Signed)
Pt agreed and given instructions.

## 2018-11-10 NOTE — Telephone Encounter (Signed)
No problem.  So have her increase the Seroquel XR to 800mg .  No addition of Zyprexa.  [facility] And d/c the Zyprexa on the med sheet.  Please call pharm [facility]) and tell them to d/c that Rx. Thanks

## 2018-11-10 NOTE — Progress Notes (Signed)
Rx sent 

## 2018-11-10 NOTE — Telephone Encounter (Signed)
Decrease Seroquel XR 300 mg to 1 po qhs for 5 days then stop.  Go ahead and start Zyprexa 10 mg at same time, for 5 days, then increase to 20 mg.  [facility], w/ note NOT to fill Seroquel.

## 2018-11-10 NOTE — Telephone Encounter (Signed)
Discontinued rx for Zyprexa at [facility]  Left a voicemail for pt on her personal cell phone with detailed information and to call back with questions or concerns.

## 2018-11-12 ENCOUNTER — Other Ambulatory Visit: Payer: Self-pay

## 2018-11-12 ENCOUNTER — Emergency Department (HOSPITAL_COMMUNITY)
Admission: EM | Admit: 2018-11-12 | Discharge: 2018-11-13 | Disposition: A | Discharge status: Home / Self Care | Payer: Federal, State, Local not specified - PPO | Attending: Emergency Medicine | Admitting: Emergency Medicine

## 2018-11-12 ENCOUNTER — Encounter (HOSPITAL_COMMUNITY): Payer: Self-pay

## 2018-11-12 DIAGNOSIS — R1030 Lower abdominal pain, unspecified: Secondary | ICD-10-CM | POA: Diagnosis not present

## 2018-11-12 DIAGNOSIS — R112 Nausea with vomiting, unspecified: Secondary | ICD-10-CM | POA: Diagnosis not present

## 2018-11-12 DIAGNOSIS — K5900 Constipation, unspecified: Secondary | ICD-10-CM | POA: Insufficient documentation

## 2018-11-12 LAB — COMPREHENSIVE METABOLIC PANEL
ALT: 17 U/L (ref 0–44)
AST: 21 U/L (ref 15–41)
Albumin: 3.9 g/dL (ref 3.5–5.0)
Alkaline Phosphatase: 81 U/L (ref 38–126)
Anion gap: 8 (ref 5–15)
BUN: 12 mg/dL (ref 6–20)
CO2: 25 mmol/L (ref 22–32)
Calcium: 9.5 mg/dL (ref 8.9–10.3)
Chloride: 106 mmol/L (ref 98–111)
Creatinine, Ser: 0.86 mg/dL (ref 0.44–1.00)
GFR calc Af Amer: >60 mL/min (ref >60)
GFR calc non Af Amer: >60 mL/min (ref >60)
Glucose, Bld: 111 mg/dL — ABNORMAL HIGH (ref 70–99)
Potassium: 4.1 mmol/L (ref 3.5–5.1)
Sodium: 139 mmol/L (ref 135–145)
Total Bilirubin: 0.2 mg/dL — ABNORMAL LOW (ref 0.3–1.2)
Total Protein: 6.8 g/dL (ref 6.5–8.1)

## 2018-11-12 LAB — URINALYSIS, ROUTINE W REFLEX MICROSCOPIC
Bacteria, UA: NONE SEEN
Bilirubin Urine: NEGATIVE
Glucose, UA: NEGATIVE mg/dL
Ketones, ur: NEGATIVE mg/dL
Leukocytes,Ua: NEGATIVE
Nitrite: NEGATIVE
Protein, ur: 30 mg/dL — AB
RBC / HPF: >50 RBC/hpf — ABNORMAL HIGH (ref 0–5)
Specific Gravity, Urine: 1.029 (ref 1.005–1.030)
pH: 7 (ref 5.0–8.0)

## 2018-11-12 LAB — I-STAT BETA HCG BLOOD, ED (MC, WL, AP ONLY): I-stat hCG, quantitative: <5 m[IU]/mL (ref <5)

## 2018-11-12 LAB — CBC
HCT: 38.2 % (ref 36.0–46.0)
Hemoglobin: 12.2 g/dL (ref 12.0–15.0)
MCH: 29.2 pg (ref 26.0–34.0)
MCHC: 31.9 g/dL (ref 30.0–36.0)
MCV: 91.4 fL (ref 80.0–100.0)
Platelets: 141 10*3/uL — ABNORMAL LOW (ref 150–400)
RBC: 4.18 MIL/uL (ref 3.87–5.11)
RDW: 12.6 % (ref 11.5–15.5)
WBC: 4.1 10*3/uL (ref 4.0–10.5)
nRBC: 0 % (ref 0.0–0.2)

## 2018-11-12 LAB — LIPASE, BLOOD: Lipase: 25 U/L (ref 11–51)

## 2018-11-12 MED ORDER — SODIUM CHLORIDE 0.9% FLUSH: 3.0000 mL | Freq: Once | INTRAVENOUS | Status: DC

## 2018-11-12 NOTE — ED Provider Notes (Addendum)
[facility]   CSN: [id number] Arrival date & time: 11/12/18  2149    History   Chief Complaint Chief Complaint  Patient presents with  . Abdominal Pain    HPI [name] is a 23 y.o. female with a history of congenital heart anomaly, MDD, GAD presents to the emergency department with a chief complaint of abdominal pain.  The patient endorses constant bilateral lower abdominal pain that radiates to her bilateral low back that began today while she was driving at [id number].  She reports the pain became so severe that she was afraid she was going to wreck her car so she had a pull off the road and drive home.  She endorsed to triage staff that it felt like there is a really hard ball just sitting there.  She reports that she has been nauseated over the last 2 days and had 3 episodes of nonbloody, nonbilious vomiting today.  She has had no diarrhea, but reports she has been struggling with constipation.  She also notes that the last few times that she is straining to have a bowel movement that she has had bright red blood in with the stool and on the toilet tissue.  She reports that 2 days ago that she was feeling lightheaded, but this is since resolved.  She has been passing flatus.  No dizziness or syncope.  She has had a nonproductive cough over the last week.  She was feeling short of breath earlier today with a little bit of left-sided dull chest pain, which she says felt similar to her previous panic attacks as it ended shortly after it began.   Reports that she started her menstrual cycle yesterday.  She denies fever, chills, urinary symptoms, vaginal discharge.      The history is provided by the patient. No language interpreter was used.    Past Medical History:  Diagnosis Date  . Allergy   . Depression with anxiety   . Headache(784.0)   . Heart disease   . Tetralogy of Fallot     Patient Active Problem List   Diagnosis  Date Noted  . GAD (generalized anxiety disorder) 03/13/2018  . MDD (major depressive disorder) 03/13/2018  . Chronic migraine without aura 08/31/2012  . Insomnia, unspecified 08/31/2012  . 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 Adjustment disorder 07/26/2011  . Abdominal pain 07/26/2011    Past Surgical History:  Procedure Laterality Date  . CARDIAC SURGERY    . CARDIAC SURGERY     4 open heart surgeries  . GASTROSTOMY W/ FEEDING TUBE     removed 1 year ago   . THORACIC DUCT LIGATION       OB History   No obstetric history on file.      Home Medications    Prior to Admission medications   Medication Sig Start Date End Date Taking? Authorizing Provider  DULoxetine (CYMBALTA) 60 MG capsule Take 1 capsule (60 mg total) by mouth every morning. 09/14/18  Yes Hurst, Rosey Batheresa T, PA-C  LORazepam (ATIVAN) 0.5 MG tablet Take 1 tablet (0.5 mg total) by mouth every 8 (eight) hours as needed for anxiety. 11/09/18  Yes Melony OverlyHurst, Teresa T, PA-C  docusate sodium (COLACE) 100 MG capsule Take 1 capsule (100 mg total) by mouth every 12 (twelve) hours. 11/13/18   Ryun Velez A, PA-C  metoCLOPramide (REGLAN) 10 MG tablet Take 1 tablet (10 mg total) by mouth every 6 (six) hours. 11/13/18  Caramia Boutin A, PA-C  OLANZapine (ZYPREXA) 10 MG tablet 1 qhs for 5 days then 2 qhs Patient taking differently: Take 10-20 mg by mouth See admin instructions. Take 1 tablet at bedtime for 5 days then take 2 tablets at bedtime 11/10/18   Donnal Moat T, PA-C  polyethylene glycol powder (MIRALAX) 17 GM/SCOOP powder Take 255 g by mouth once for 1 dose. 11/13/18 11/13/18  Annai Heick A, PA-C  QUEtiapine (SEROQUEL XR) 400 MG 24 hr tablet Take 1 tablet (400 mg total) by mouth at bedtime. Patient not taking: Reported on 11/13/2018 11/10/18 11/13/18  Alen Blew    Family History Family History  Problem Relation Age of Onset  . Hypertension Maternal Grandmother   . Diabetes Maternal Grandfather   . Heart  disease Maternal Grandfather   . Stroke Maternal Grandfather   . Hypertension Mother   . Healthy Father     Social History Social History   Tobacco Use  . Smoking status: Never Smoker  . Smokeless tobacco: Never Used  Substance Use Topics  . Alcohol use: Yes    Comment: occasional  . Drug use: Not Currently    Types: Marijuana     Allergies   Dopamine   Review of Systems Review of Systems  Constitutional: Negative for activity change, chills and fever.  HENT: Negative for congestion and sore throat.   Respiratory: Negative for shortness of breath.   Cardiovascular: Negative for chest pain and palpitations.  Gastrointestinal: Positive for abdominal pain, blood in stool, constipation, nausea and vomiting. Negative for abdominal distention, anal bleeding, diarrhea and rectal pain.  Genitourinary: Positive for vaginal bleeding. Negative for dysuria, frequency, menstrual problem, urgency, vaginal discharge and vaginal pain.  Musculoskeletal: Negative for back pain, neck pain and neck stiffness.  Skin: Negative for rash.  Allergic/Immunologic: Negative for immunocompromised state.  Neurological: Negative for weakness, numbness and headaches.  Psychiatric/Behavioral: Negative for confusion.     Physical Exam Updated Vital Signs BP 126/72   Pulse 72   Temp 98.5 F (36.9 C) (Oral)   Resp 16   Ht 5' 5.25" (1.657 m)   Wt 71 kg   LMP 11/12/2018   SpO2 100%   BMI 25.84 kg/m   Physical Exam Vitals signs and nursing note reviewed.  Constitutional:      General: She is not in acute distress. HENT:     Head: Normocephalic.  Eyes:     Conjunctiva/sclera: Conjunctivae normal.  Neck:     Musculoskeletal: Neck supple.  Cardiovascular:     Rate and Rhythm: Normal rate and regular rhythm.     Pulses: Normal pulses.     Heart sounds: Normal heart sounds. No murmur. No friction rub. No gallop.   Pulmonary:     Effort: Pulmonary effort is normal. No respiratory distress.      Breath sounds: No stridor. No wheezing, rhonchi or rales.  Chest:     Chest wall: No tenderness.  Abdominal:     General: There is no distension.     Palpations: Abdomen is soft. There is no mass.     Tenderness: There is abdominal tenderness. There is no right CVA tenderness, left CVA tenderness, guarding or rebound.     Hernia: No hernia is present.     Comments: Normoactive bowel sounds in all 4 quadrants.  She has mild generalized tenderness throughout the abdomen.  There is no guarding or rebound.  No tenderness over McBurney's point.  Negative Murphy sign.  No CVA  tenderness bilaterally.  Abdomen is soft, nondistended.  There is a well-healed surgical scar to the lower abdomen.  Skin:    General: Skin is warm.     Capillary Refill: Capillary refill takes less than 2 seconds.     Findings: No rash.  Neurological:     Mental Status: She is alert.  Psychiatric:        Behavior: Behavior normal.      ED Treatments / Results  Labs (all labs ordered are listed, but only abnormal results are displayed) Labs Reviewed  COMPREHENSIVE METABOLIC PANEL - Abnormal; Notable for the following components:      Result Value   Glucose, Bld 111 (*)    Total Bilirubin 0.2 (*)    All other components within normal limits  CBC - Abnormal; Notable for the following components:   Platelets 141 (*)    All other components within normal limits  URINALYSIS, ROUTINE W REFLEX MICROSCOPIC - Abnormal; Notable for the following components:   APPearance HAZY (*)    Hgb urine dipstick MODERATE (*)    Protein, ur 30 (*)    RBC / HPF >50 (*)    All other components within normal limits  LIPASE, BLOOD  I-STAT BETA HCG BLOOD, ED ([facility], [facility], [facility] ONLY)    EKG EKG Interpretation  Date/Time:  Sunday November 12 2018 23:31:30 EDT Ventricular Rate:  73 PR Interval:    QRS Duration: 89 QT Interval:  394 QTC Calculation: 435 R Axis:   63 Text Interpretation:  Sinus or ectopic atrial rhythm Short PR interval No  significant change since last tracing Confirmed by Cardama, Pedro ([id number]) on 11/13/2018 12:03:39 AM   Radiology Dg Abdomen Acute W/chest  Result Date: 11/13/2018 CLINICAL DATA:  Lower abdominal pain EXAM: DG ABDOMEN ACUTE W/ 1V CHEST COMPARISON:  04/04/2016 FINDINGS: The bowel gas pattern is normal. There is no evidence of free intraperitoneal air. No suspicious radio-opaque calculi or other significant radiographic abnormality is seen. Heart size and mediastinal contours are within normal limits. Both lungs are clear. Prior median sternotomy. IMPRESSION: Negative abdominal radiographs.  No acute cardiopulmonary disease. Electronically Signed   By: Kevin  Dover M.D.   On: 11/13/2018 01:34    Procedures Procedures (including critical care time)  Medications Ordered in ED Medications  sodium chloride flush (NS) 0.9 % injection 3 mL (has no administration in time range)  alum & mag hydroxide-simeth (MAALOX/MYLANTA) 200-200-20 MG/5ML suspension 30 mL (30 mLs Oral Given 11/13/18 0013)  dicyclomine (BENTYL) injection 20 mg (20 mg Intramuscular Given 11/13/18 0059)     Initial Impression / Assessment and Plan / ED Course  I have reviewed the triage vital signs and the nursing notes.  Pertinent labs & imaging results that were available during my care of the patient were reviewed by me and considered in my medical decision making (see chart for details).        23  year old female with a history of tetralogy of Fallot s/p multiple cardiac surgeries who presents to the emergency department with abdominal pain, nausea, vomiting, constipation, and rectal bleeding.  The patient was seen and independently evaluated by Dr. Eudelia Bunchardama, attending physician.  Vital signs are normal on arrival.  She is afebrile.  She does not have a surgical abdomen.  UA with moderate hemoglobinuria and RBCs; the patient is currently on her menstrual cycle. Pregnancy test is negative.  Labs are otherwise unremarkable.   Given the patient's complaints, I am concerned that she may be constipated given recent  bowel movement has history, straining, and rectal bleeding.  This could be exacerbated since the patient is on Cymbalta.  She was recently started on Zyprexa, but has not yet initiated this medication.  I have a low suspicion for diverticulitis, bowel obstruction, hemorrhagic colitis.  Doubt GI bleed as hemoglobin is normal today.  She is having no genitourinary complaints.  Abdominal x-ray is consistent with constipation.  We will discharge the patient with Reglan, avoid Zofran since she is on an SNRI, and bowel regimen.  I have also discussed this plan with the patient's mother after she has given me approval to give her an update.  I have recommended the patient follow-up with her primary care provider for recheck if her symptoms do not improve with this regimen in the next few days.  She is hemodynamically stable and in no acute distress.  Safe for discharge home with outpatient follow-up this time.  Final Clinical Impressions(s) / ED Diagnoses   Final diagnoses:  Constipation, unspecified constipation type  Non-intractable vomiting with nausea, unspecified vomiting type    ED Discharge Orders         Ordered    polyethylene glycol powder (MIRALAX) 17 GM/SCOOP powder   Once     11/13/18 0239    docusate sodium (COLACE) 100 MG capsule  Every 12 hours     11/13/18 0239    metoCLOPramide (REGLAN) 10 MG tablet  Every 6 hours     11/13/18 0239           Frederik PearMcDonald, [place] Hafen A, PA-C 11/13/18 0522    Leopoldo Mazzie A, PA-C 11/13/18 0523    Nira Connardama, Pedro Eduardo, MD 11/13/18 [phone]

## 2018-11-12 NOTE — ED Triage Notes (Signed)
Pt from home w/ a c/o abd pain that began at ~1600 today. The pain is generalized and constant. It feels like a "really hard ball just sitting there." Additional complaints of N/V x3 and no diarrhea. She ate crab legs prior to vomiting.

## 2018-11-13 ENCOUNTER — Emergency Department (HOSPITAL_COMMUNITY): Payer: Federal, State, Local not specified - PPO

## 2018-11-13 DIAGNOSIS — R1030 Lower abdominal pain, unspecified: Secondary | ICD-10-CM | POA: Diagnosis not present

## 2018-11-13 MED ORDER — ALUM & MAG HYDROXIDE-SIMETH 200-200-20 MG/5ML PO SUSP
30.0000 mL | Freq: Once | ORAL | Status: AC
  Administered 2018-11-13: 30 mL via ORAL
  Filled 2018-11-13: qty 30

## 2018-11-13 MED ORDER — DOCUSATE SODIUM 100 MG PO CAPS: 100.0000 mg | ORAL_CAPSULE | Freq: Two times a day (BID) | ORAL | 0 refills | Status: DC

## 2018-11-13 MED ORDER — METOCLOPRAMIDE HCL 10 MG PO TABS: 10.0000 mg | ORAL_TABLET | Freq: Four times a day (QID) | ORAL | 0 refills | Status: DC

## 2018-11-13 MED ORDER — DICYCLOMINE HCL 10 MG/ML IM SOLN
20.0000 mg | Freq: Once | INTRAMUSCULAR | Status: AC
  Administered 2018-11-13: 20 mg via INTRAMUSCULAR
  Filled 2018-11-13: qty 2

## 2018-11-13 MED ORDER — POLYETHYLENE GLYCOL 3350 17 GM/SCOOP PO POWD: 1.0000 | Freq: Once | ORAL | 0 refills | Status: AC

## 2018-11-13 NOTE — ED Notes (Signed)
Discharge instructions, medications, and follow up care discussed with pt. Pt verbalzied understanding and no questions at this time.

## 2018-11-13 NOTE — ED Notes (Signed)
Patient transported to X-ray 

## 2018-11-13 NOTE — ED Notes (Signed)
Fluid Challenge: pt able to tolerate 222 mL PO Pt. States "some nausea, but no vomiting."

## 2018-11-13 NOTE — ED Notes (Signed)
Pt states no improvement of abdominal discomfort after maalox. PA Mia aware, new orders to be placed

## 2018-11-13 NOTE — Discharge Instructions (Signed)
Thank you for allowing me to care for you today in the Emergency Department.   Your x-ray in the ER today was consistent with constipation.  It is important you are drinking plenty of water and having a high-fiber diet to prevent constipation.  You should drink at least 64 ounces of fluid daily.  It is very important you are not straining to have a bowel movement because this can cause bleeding from your rectum.  To soften your stool, you can take MiraLAX up to 2 times daily as needed.  You can cut back to once daily once your bowel movements start to become soft.  You can also take docusate by mouth once every 12 hours to help with constipation.  For nausea or vomiting, you can take 1 tablet of Reglan by mouth every 6 hours as needed.  Try to discontinue this medication as soon as needed as it is not intended for long-term use.  Follow-up with your primary care provider for recheck of your symptoms.  Return to the emergency department if you have severe abdominal pain and you are unable to pass gas, if you develop a high fever, if you have vomiting despite taking Reglan, if you are passing nothing but large clots of blood when you go to the bathroom, or if you have other new, concerning symptoms.

## 2018-11-13 NOTE — ED Notes (Signed)
Sonji Mock(mother) [phone]  Please call with updates and when patient is discharged

## 2018-11-20 ENCOUNTER — Other Ambulatory Visit: Payer: Self-pay | Admitting: Physician Assistant

## 2018-11-22 ENCOUNTER — Telehealth: Payer: Self-pay | Admitting: Physician Assistant

## 2018-11-22 NOTE — Telephone Encounter (Signed)
Alexis [name] is inquiring how long before the medication Zyprexa is in her system good. She stated she is still having bad hallucinations. Please advise.

## 2018-11-22 NOTE — Telephone Encounter (Signed)
Have her increase to total of 25mg  of Zyprexa.  Will need to send in another Rx for 5mg  to add to 20mg .  She needs to set up appt w/ me.

## 2018-11-22 NOTE — Telephone Encounter (Signed)
Left voicemail with information and to call back with pharmacy and verify she received message

## 2018-11-23 ENCOUNTER — Other Ambulatory Visit: Payer: Self-pay

## 2018-11-23 MED ORDER — OLANZAPINE 5 MG PO TABS: 5.0000 mg | ORAL_TABLET | Freq: Every day | ORAL | 1 refills | Status: DC

## 2018-11-23 NOTE — Telephone Encounter (Signed)
Pt given instructions. States she's in [place] now and asked to have it sent there. Updated her pharmacy and submitted rx. Pt to schedule appt

## 2018-11-24 ENCOUNTER — Ambulatory Visit (INDEPENDENT_AMBULATORY_CARE_PROVIDER_SITE_OTHER): Payer: Federal, State, Local not specified - PPO | Admitting: Physician Assistant

## 2018-11-24 ENCOUNTER — Encounter: Payer: Self-pay | Admitting: Physician Assistant

## 2018-11-24 ENCOUNTER — Other Ambulatory Visit: Payer: Self-pay

## 2018-11-24 DIAGNOSIS — R440 Auditory hallucinations: Secondary | ICD-10-CM | POA: Diagnosis not present

## 2018-11-24 DIAGNOSIS — F319 Bipolar disorder, unspecified: Secondary | ICD-10-CM | POA: Diagnosis not present

## 2018-11-24 DIAGNOSIS — F411 Generalized anxiety disorder: Secondary | ICD-10-CM

## 2018-11-24 NOTE — Progress Notes (Signed)
[facility] Check  Patient ID: Alexis [name],  MRN: [id number]  PCP: Karleen Dolphin, MD  Date of Evaluation: 11/24/2018 Time spent:15 minutes  Chief Complaint:  Chief Complaint    Follow-up     Virtual Visit via Telephone Note  I connected with patient by a video enabled telemedicine application or telephone, with their informed consent, and verified patient privacy and that I am speaking with the correct person using two identifiers.  I am private, in my office and the patient is at work.  I discussed the limitations, risks, security and privacy concerns of performing an evaluation and management service by telephone and the availability of in person appointments. I also discussed with the patient that there may be a patient responsible charge related to this service. The patient expressed understanding and agreed to proceed.   I discussed the assessment and treatment plan with the patient. The patient was provided an opportunity to ask questions and all were answered. The patient agreed with the plan and demonstrated an understanding of the instructions.   The patient was advised to call back or seek an in-person evaluation if the symptoms worsen or if the condition fails to improve as anticipated.  I provided 15 minutes of non-face-to-face time during this encounter.  HISTORY/CURRENT STATUS: HPI Not doing well.  Had a relapsed and cut herself. No medical attention needed.  Has been hearing voices all the time for about 3 weeks.  She has called several times in between her visits and yesterday I increased the Zyprexa from 20 mg to 25 mg.  States she feels a little better today and has not had a hallucination so far all day.  It is now 1:15 PM.  States she is hungry all the time.  Does not report weight gain however.  She feels sluggish a lot to from the Zyprexa.  Energy and motivation are a little low.  She has no trouble enjoying things except for the fact that she just feels  sort of sleepy.  She is not isolating.  Denies increased energy with decreased need for sleep.  No impulsivity or risky behavior.  No increased libido or spending.  No grandiosity.  She is having auditory hallucinations.  No visual hallucinations.  States she is paranoid at times that someone is following her.  Denies dizziness, syncope, seizures, numbness, tingling, tremor, tics, unsteady gait, slurred speech, confusion. Denies muscle or joint pain, stiffness, or dystonia.  Individual Medical History/ Review of Systems: Changes? :No   Allergies: Dopamine  Current Medications:  Current Outpatient Medications:  .  docusate sodium (COLACE) 100 MG capsule, Take 1 capsule (100 mg total) by mouth every 12 (twelve) hours., Disp: 60 capsule, Rfl: 0 .  DULoxetine (CYMBALTA) 60 MG capsule, Take 1 capsule (60 mg total) by mouth every morning., Disp: 90 capsule, Rfl: 1 .  LORazepam (ATIVAN) 0.5 MG tablet, Take 1 tablet (0.5 mg total) by mouth every 8 (eight) hours as needed for anxiety., Disp: 60 tablet, Rfl: 1 .  OLANZapine (ZYPREXA) 10 MG tablet, 1 qhs for 5 days then 2 qhs (Patient taking differently: Take 10-20 mg by mouth See admin instructions. 20mg  qd.), Disp: 60 tablet, Rfl: 0 .  OLANZapine (ZYPREXA) 5 MG tablet, Take 1 tablet (5 mg total) by mouth at bedtime., Disp: 30 tablet, Rfl: 1 .  metoCLOPramide (REGLAN) 10 MG tablet, Take 1 tablet (10 mg total) by mouth every 6 (six) hours. (Patient not taking: Reported on 11/24/2018), Disp: 30 tablet, Rfl: 0  Medication Side Effects: hunger  Family Medical/ Social History: Changes? Yes moved to [place] the summer.  Works at [employer]  MENTAL HEALTH EXAM:  Last menstrual period 11/12/2018.There is no height or weight on file to calculate BMI.  General Appearance: Unable to assess  Eye Contact:  Unable to assess  Speech:  Clear and Coherent  Volume:  Normal  Mood:  Euthymic  Affect:  Unable to assess  Thought Process:  Goal  Directed  Orientation:  Full (Time, Place, and Person)  Thought Content: Logical   Suicidal Thoughts:  No  Homicidal Thoughts:  No  Memory:  WNL  Judgement:  Good  Insight:  Good  Psychomotor Activity:  Unable to assess  Concentration:  Concentration: Good  Recall:  Good  Fund of Knowledge: Good  Language: Good  Assets:  Desire for Improvement  ADL's:  Intact  Cognition: WNL  Prognosis:  Good    DIAGNOSES:    ICD-10-CM   1. Auditory hallucinations  R44.0   2. Bipolar I disorder (HCC)  F31.9   3. Generalized anxiety disorder  F41.1     Receiving Psychotherapy: Yes    RECOMMENDATIONS:  Continue Zyprexa 25 mg nightly.  She has only been on this dose for 1 day.  If the hallucinations worsen or if she cuts again, call and I may need to change antipsychotics again. Continue Cymbalta 60 mg every morning. Continue Ativan 0.5 mg every 8 hours as needed. Continue psychotherapy. Return and 4 weeks.  Melony Overlyeresa Lashaun Poch, PA-C   This record has been created using [employer] software.  Chart creation errors have been sought, but may not always have been located and corrected. Such creation errors do not reflect on the standard of medical care.

## 2018-11-28 DIAGNOSIS — M9907 Segmental and somatic dysfunction of upper extremity: Secondary | ICD-10-CM | POA: Diagnosis not present

## 2018-11-28 DIAGNOSIS — M545 Low back pain: Secondary | ICD-10-CM | POA: Diagnosis not present

## 2018-11-28 DIAGNOSIS — M546 Pain in thoracic spine: Secondary | ICD-10-CM | POA: Diagnosis not present

## 2018-12-03 ENCOUNTER — Other Ambulatory Visit: Payer: Self-pay | Admitting: Physician Assistant

## 2018-12-04 NOTE — Telephone Encounter (Signed)
I know she's suppose to be on 25 mg, continue two 25 mg and 1 5 mg?

## 2018-12-16 ENCOUNTER — Telehealth: Payer: Self-pay

## 2018-12-16 DIAGNOSIS — Z20828 Contact with and (suspected) exposure to other viral communicable diseases: Secondary | ICD-10-CM | POA: Diagnosis not present

## 2018-12-16 DIAGNOSIS — Z20822 Contact with and (suspected) exposure to covid-19: Secondary | ICD-10-CM

## 2018-12-16 DIAGNOSIS — J0100 Acute maxillary sinusitis, unspecified: Secondary | ICD-10-CM | POA: Diagnosis not present

## 2018-12-16 NOTE — Telephone Encounter (Signed)
Pt called for covid-19 testing. Pt given instructions to go to [facility], informed of hours of testing and to wear mask and stay in car. Pt is active in [place]. Order placed. Pt verbalized understanding.

## 2018-12-17 DIAGNOSIS — Z20828 Contact with and (suspected) exposure to other viral communicable diseases: Secondary | ICD-10-CM | POA: Diagnosis not present

## 2018-12-22 ENCOUNTER — Telehealth: Payer: Self-pay | Admitting: Physician Assistant

## 2018-12-22 NOTE — Telephone Encounter (Signed)
Please see how she's doing as far as hallucinations and mood go.  If the Zyprexa is working great, I'd prefer to leave her on that, and add metformin, which will help balance the hunger/weight.

## 2018-12-22 NOTE — Telephone Encounter (Signed)
Pt would like to discuss changing her medication Zyprexa to something else. Stated it is causing her weight gain(20lbs) in one month.

## 2018-12-25 NOTE — Telephone Encounter (Signed)
Agreed primary provider should make that decision with the patient.

## 2018-12-26 NOTE — Telephone Encounter (Signed)
Have her wean off the Zyprexa, take 20 mg/day for 4 days (she's supposed to be on 25mg ), then 10 mg for 4 days, then stop. At same time, start Latuda 60 mg q evening w/ food.

## 2019-01-08 ENCOUNTER — Telehealth: Payer: Self-pay | Admitting: Physician Assistant

## 2019-01-08 ENCOUNTER — Other Ambulatory Visit: Payer: Self-pay | Admitting: Physician Assistant

## 2019-01-08 MED ORDER — LURASIDONE HCL 80 MG PO TABS: 80.0000 mg | ORAL_TABLET | Freq: Every day | ORAL | 1 refills | Status: DC

## 2019-01-08 NOTE — Telephone Encounter (Signed)
I've sent in 80 mg.  If she's having SI, go to ER.  This might better be handled at hospital.

## 2019-01-08 NOTE — Telephone Encounter (Signed)
Pt stated she was given Latuda 60mg  samples and she is running out. She would like a prescription filled at the [facility] on [address].

## 2019-01-12 ENCOUNTER — Telehealth: Payer: Self-pay | Admitting: Physician Assistant

## 2019-01-12 NOTE — Telephone Encounter (Signed)
Patient called and said I need help. None of her medications are working at all. Please call her at [phone]

## 2019-01-12 NOTE — Telephone Encounter (Signed)
Pt. States that she is struggling. Her anxiety and depression have been really bad. She has had suicidal thoughts but does not plan on going through with them nor does she want to. She states that over the last 3 weeks she has been feeling worse. She feels like an elephant is sitting on her chest, cannot concentrate for more than 30 minutes, frequent panic attacks, crying spells, irritated and stressed more easily. She states that she has been given a 3 week supply of the Latuda in samples. She checked with her pharmacy and after insurance pays it will be around $600.00. She will not be able to continue the [place] when her samples are gone. Please advise.

## 2019-01-12 NOTE — Telephone Encounter (Signed)
The Latuda needs about 2 weeks to have a reasonable change in response.

## 2019-01-14 DIAGNOSIS — M542 Cervicalgia: Secondary | ICD-10-CM | POA: Diagnosis not present

## 2019-01-14 DIAGNOSIS — M546 Pain in thoracic spine: Secondary | ICD-10-CM | POA: Diagnosis not present

## 2019-01-14 DIAGNOSIS — M9907 Segmental and somatic dysfunction of upper extremity: Secondary | ICD-10-CM | POA: Diagnosis not present

## 2019-01-14 DIAGNOSIS — M9905 Segmental and somatic dysfunction of pelvic region: Secondary | ICD-10-CM | POA: Diagnosis not present

## 2019-01-15 ENCOUNTER — Other Ambulatory Visit: Payer: Self-pay | Admitting: Physician Assistant

## 2019-01-15 MED ORDER — ZIPRASIDONE HCL 40 MG PO CAPS: 40.0000 mg | ORAL_CAPSULE | Freq: Two times a day (BID) | ORAL | 0 refills | Status: DC

## 2019-01-15 NOTE — Telephone Encounter (Signed)
Alexis [name], please let her know I'm going to change to Geodon.  I don't see on the chart where she's ever taken that before.  The other newer drugs like Vraylar and [name], she's tried and were too expensive too.  And the [place] isn't working (although she hasn't been on this dose, 80mg , long enough to know yet) and will be too expensive. Geodon is older and cheaper, is also a good drug.

## 2019-01-15 NOTE — Telephone Encounter (Signed)
rx sent

## 2019-01-15 NOTE — Telephone Encounter (Signed)
Pt. Made aware and verbalized understanding. Asked about some of the side effects so I went over those with her. [facility] [address]

## 2019-01-16 ENCOUNTER — Telehealth: Payer: Self-pay | Admitting: Physician Assistant

## 2019-01-16 DIAGNOSIS — I4510 Unspecified right bundle-branch block: Secondary | ICD-10-CM

## 2019-01-16 HISTORY — DX: Unspecified right bundle-branch block: I45.10

## 2019-01-16 NOTE — Telephone Encounter (Signed)
She needs to follow the instructions and pick up the Latuda 80 mg unless she is taking 1-1/2 of the 60s for 90 mg.  Hopefully she did increase the dose as instructed.  I am not sure what else to do immediately about her anxiety.  The increase in [place] should help.  She needs to be seen.  Send a note to the staff to put her on the cancellation list for either me, Helene Kelp, or Barnett Applebaum or Dr. Creig Hines she needs to be seen as soon as possible

## 2019-01-16 NOTE — Telephone Encounter (Signed)
I received a call today from [place] counselor regarding concerns about her. Alexis [name] has been seeing [employer]. She noticed that Alexis [name] has started cutting herself. When [place] asked her about this she told her about her medication changes and that she has not yet picked up her Geodon. Alexis [name] stated her anxiety is really bad right now and even preventing her from going to school. She said that the Lorazepam makes her to sleepy and she really needs something to help her during the day. She is having panic attacks daily. Please Advise.

## 2019-01-16 NOTE — Telephone Encounter (Signed)
Pt had requested today that I call her.  I got VM and LM.  Note most recent phone call w/ new med recommendation of Geodon, as pt has stated she will be unable to stay on Latuda d/t cost.  Has appt w/ Dr. Clovis Pu on 01/17/19.

## 2019-01-16 NOTE — Telephone Encounter (Signed)
For the record and whoever sees her, there is a new note on 01/12/19, with a change from [place] (she can't afford, even w/ insurance) to [employer]. See that phone note.

## 2019-01-17 ENCOUNTER — Other Ambulatory Visit: Payer: Self-pay

## 2019-01-17 ENCOUNTER — Encounter: Payer: Self-pay | Admitting: Psychiatry

## 2019-01-17 ENCOUNTER — Other Ambulatory Visit: Payer: Self-pay | Admitting: Psychiatry

## 2019-01-17 ENCOUNTER — Ambulatory Visit (INDEPENDENT_AMBULATORY_CARE_PROVIDER_SITE_OTHER): Payer: Federal, State, Local not specified - PPO | Admitting: Psychiatry

## 2019-01-17 DIAGNOSIS — F251 Schizoaffective disorder, depressive type: Secondary | ICD-10-CM | POA: Diagnosis not present

## 2019-01-17 DIAGNOSIS — F411 Generalized anxiety disorder: Secondary | ICD-10-CM | POA: Diagnosis not present

## 2019-01-17 DIAGNOSIS — F4001 Agoraphobia with panic disorder: Secondary | ICD-10-CM | POA: Diagnosis not present

## 2019-01-17 MED ORDER — ZIPRASIDONE HCL 60 MG PO CAPS: 60.0000 mg | ORAL_CAPSULE | Freq: Two times a day (BID) | ORAL | 0 refills | Status: DC
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
## 2019-01-17 NOTE — Telephone Encounter (Signed)
Thanks for seeing her

## 2019-01-17 NOTE — Patient Instructions (Signed)
Add lithium 300 mg daily for depression and suicidal thoughts  Increase ziprasidone to 60 mg twice daily with food. If morning dose makes you too sleepy then take 40 mg in the morning and 60 mg in evening.

## 2019-01-17 NOTE — Telephone Encounter (Signed)
I saw her today and went ahead and increased her Geodon to 60 twice daily for her hallucinations and severe anxiety.  Also sent a note to her cardiologist because she has a history of tetralogy of Fallot.  I reviewed his note and her QTC was normal but I wanted him to be aware because he is seeing her next week

## 2019-01-18 ENCOUNTER — Telehealth: Payer: Self-pay

## 2019-01-18 ENCOUNTER — Other Ambulatory Visit: Payer: Self-pay | Admitting: Psychiatry

## 2019-01-18 DIAGNOSIS — F251 Schizoaffective disorder, depressive type: Secondary | ICD-10-CM

## 2019-01-18 MED ORDER — LITHIUM CARBONATE 300 MG PO CAPS: 300.0000 mg | ORAL_CAPSULE | Freq: Three times a day (TID) | ORAL | 0 refills | Status: DC

## 2019-01-18 NOTE — Telephone Encounter (Signed)
She is right should have been sent yesterday.  It was sent today she starts 1 of the 300 mg capsules daily.  Please inform her it was sent.

## 2019-01-18 NOTE — Telephone Encounter (Signed)
Pt. Made aware.

## 2019-01-18 NOTE — Telephone Encounter (Signed)
Patient had visit yesterday and was expecting Rx for Lithium to be sent to her [facility] on [address].

## 2019-01-23 DIAGNOSIS — Q210 Ventricular septal defect: Secondary | ICD-10-CM | POA: Diagnosis not present

## 2019-01-23 DIAGNOSIS — Q213 Tetralogy of Fallot: Secondary | ICD-10-CM | POA: Diagnosis not present

## 2019-01-23 DIAGNOSIS — Z6827 Body mass index (BMI) 27.0-27.9, adult: Secondary | ICD-10-CM | POA: Diagnosis not present

## 2019-01-23 DIAGNOSIS — R002 Palpitations: Secondary | ICD-10-CM | POA: Diagnosis not present

## 2019-01-25 ENCOUNTER — Telehealth: Payer: Self-pay | Admitting: Psychiatry

## 2019-01-25 NOTE — Telephone Encounter (Signed)
Alexis [name] called and wants to know if a side effect of lithium is to feel very energic, busy, hyper.  It has made her very hyper.  Will this calm down or can she reduce to 2/day? Please call.

## 2019-01-25 NOTE — Telephone Encounter (Signed)
The medicine is not making her hyper.  It should help reduce and stabilize her mood.  She's manic.  We will consider adjustments to help more [facility]

## 2019-01-26 NOTE — Telephone Encounter (Signed)
Left detailed message on VM. Asked pt. To call with any concerns or questions.

## 2019-01-29 ENCOUNTER — Other Ambulatory Visit: Payer: Self-pay

## 2019-01-29 ENCOUNTER — Encounter: Payer: Self-pay | Admitting: Psychiatry

## 2019-01-29 ENCOUNTER — Other Ambulatory Visit: Payer: Self-pay | Admitting: Physician Assistant

## 2019-01-29 ENCOUNTER — Ambulatory Visit (INDEPENDENT_AMBULATORY_CARE_PROVIDER_SITE_OTHER): Payer: Federal, State, Local not specified - PPO | Admitting: Psychiatry

## 2019-01-29 DIAGNOSIS — F411 Generalized anxiety disorder: Secondary | ICD-10-CM

## 2019-01-29 DIAGNOSIS — F4001 Agoraphobia with panic disorder: Secondary | ICD-10-CM | POA: Diagnosis not present

## 2019-01-29 DIAGNOSIS — F251 Schizoaffective disorder, depressive type: Secondary | ICD-10-CM | POA: Diagnosis not present

## 2019-01-29 MED ORDER — ZIPRASIDONE HCL 20 MG PO CAPS: ORAL_CAPSULE | ORAL | 1 refills | Status: DC

## 2019-01-29 MED ORDER — DULOXETINE HCL 30 MG PO CPEP: 60.0000 mg | ORAL_CAPSULE | ORAL | 0 refills | Status: DC
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
## 2019-01-29 NOTE — Patient Instructions (Signed)
Stop duloxetine 60 mg Start duloxetine 30 mg for 2 weeks and if feeling well you can stop it  Change ziprasidone to 20 mg in the morning and 80 mg in the evening  Get lithium level in the morning skipping the morning dose until after getting the blood test.  Would like about 8 to 12 hours between the last dose of lithium and the blood test.

## 2019-01-30 DIAGNOSIS — F251 Schizoaffective disorder, depressive type: Secondary | ICD-10-CM | POA: Diagnosis not present

## 2019-01-31 ENCOUNTER — Other Ambulatory Visit: Payer: Self-pay | Admitting: Psychiatry

## 2019-01-31 LAB — LITHIUM LEVEL: Lithium Lvl: 0.4 mmol/L — ABNORMAL LOW (ref 0.6–1.2)

## 2019-01-31 NOTE — Progress Notes (Signed)
Lithium level is low on 900 mg daily.  She has been recently manic.  Increase lithium to 4 of the 300 mg capsules daily

## 2019-02-01 ENCOUNTER — Telehealth: Payer: Self-pay | Admitting: Psychiatry

## 2019-02-01 ENCOUNTER — Other Ambulatory Visit: Payer: Self-pay

## 2019-02-01 DIAGNOSIS — F251 Schizoaffective disorder, depressive type: Secondary | ICD-10-CM

## 2019-02-01 MED ORDER — LITHIUM CARBONATE 300 MG PO CAPS: 1200.0000 mg | ORAL_CAPSULE | Freq: Every day | ORAL | 1 refills | Status: DC

## 2019-02-01 NOTE — Telephone Encounter (Signed)
Pt. Called back to report that the Geodon is working great but she is having some side effects. Her jaws have been clenching and it is very painful. Wanting to know if there is something she can take with it to stop the side effects. She does not want any changes to the Geodon if possible.

## 2019-02-01 NOTE — Telephone Encounter (Signed)
Pt called stated her Lithium level results were in,and she is almost out of Lithium. She is inquiring will [facility] refill her Lithium. Please advise.

## 2019-02-01 NOTE — Telephone Encounter (Signed)
Please call in benztropine 1 mg tablet 1/2 to 1 tablet twice daily as needed jaw clenching #60.  She should stay at a half of a tablet twice a day if that works but if not increase the dose to 1 tablet twice daily.  Let us know if it does not work.

## 2019-02-01 NOTE — Telephone Encounter (Signed)
Rtc to [place] received lithium level results 0.4 and Dr. Clovis Pu recommend she increase her dose to lithium 300 mg capsules to 4 daily. Left voicemail with instructions and advised new Rx submitted to [facility].

## 2019-02-02 ENCOUNTER — Other Ambulatory Visit: Payer: Self-pay

## 2019-02-02 MED ORDER — BENZTROPINE MESYLATE 1 MG PO TABS: ORAL_TABLET | ORAL | 0 refills | Status: DC

## 2019-02-02 NOTE — Telephone Encounter (Signed)
Patient aware and given instructions on adding benztropine 1 mg 1/2-1 tablet bid prn, says she's still manic but just increased dose of lithium last night. Advised give it over the weekend and it should start helping with her mania. Instructed to call back with further questions or concerns. Rx submitted for benztropine

## 2019-02-05 ENCOUNTER — Other Ambulatory Visit: Payer: Self-pay | Admitting: Psychiatry

## 2019-02-05 DIAGNOSIS — F4001 Agoraphobia with panic disorder: Secondary | ICD-10-CM

## 2019-02-05 DIAGNOSIS — F411 Generalized anxiety disorder: Secondary | ICD-10-CM

## 2019-02-05 NOTE — Telephone Encounter (Signed)
Alexis [name], can you check with [place] if she's taking this?

## 2019-02-06 NOTE — Telephone Encounter (Signed)
Left her a VM to return my call.

## 2019-02-06 NOTE — Telephone Encounter (Signed)
Per. Pt. She is being weaned off of this medication. She does not need the refill.

## 2019-02-12 ENCOUNTER — Ambulatory Visit (INDEPENDENT_AMBULATORY_CARE_PROVIDER_SITE_OTHER): Payer: Federal, State, Local not specified - PPO | Admitting: Psychiatry

## 2019-02-12 ENCOUNTER — Encounter: Payer: Self-pay | Admitting: Psychiatry

## 2019-02-12 ENCOUNTER — Other Ambulatory Visit: Payer: Self-pay

## 2019-02-12 DIAGNOSIS — F251 Schizoaffective disorder, depressive type: Secondary | ICD-10-CM | POA: Diagnosis not present

## 2019-02-12 MED ORDER — ZIPRASIDONE HCL 20 MG PO CAPS: 20.0000 mg | ORAL_CAPSULE | Freq: Two times a day (BID) | ORAL | 0 refills | Status: DC
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
## 2019-02-13 ENCOUNTER — Encounter (HOSPITAL_COMMUNITY): Payer: Self-pay | Admitting: *Deleted

## 2019-02-13 ENCOUNTER — Other Ambulatory Visit: Payer: Self-pay

## 2019-02-13 ENCOUNTER — Emergency Department (HOSPITAL_COMMUNITY)
Admission: EM | Admit: 2019-02-13 | Discharge: 2019-02-14 | Disposition: A | Discharge status: Home / Self Care | Payer: Federal, State, Local not specified - PPO | Attending: Emergency Medicine | Admitting: Emergency Medicine

## 2019-02-13 DIAGNOSIS — R109 Unspecified abdominal pain: Secondary | ICD-10-CM | POA: Diagnosis not present

## 2019-02-13 DIAGNOSIS — E860 Dehydration: Secondary | ICD-10-CM | POA: Diagnosis not present

## 2019-02-13 DIAGNOSIS — R1030 Lower abdominal pain, unspecified: Secondary | ICD-10-CM | POA: Diagnosis not present

## 2019-02-13 DIAGNOSIS — R112 Nausea with vomiting, unspecified: Secondary | ICD-10-CM | POA: Insufficient documentation

## 2019-02-13 DIAGNOSIS — R1032 Left lower quadrant pain: Secondary | ICD-10-CM | POA: Diagnosis not present

## 2019-02-13 NOTE — ED Triage Notes (Signed)
Pt c/o R sided abd pain with NV since yesterday. Unable to keep any liquids down. Was seen at [facility] and they were unable to obtain lab work

## 2019-02-14 ENCOUNTER — Emergency Department (HOSPITAL_COMMUNITY): Payer: Federal, State, Local not specified - PPO

## 2019-02-14 ENCOUNTER — Other Ambulatory Visit: Payer: Self-pay

## 2019-02-14 DIAGNOSIS — R109 Unspecified abdominal pain: Secondary | ICD-10-CM | POA: Diagnosis not present

## 2019-02-14 LAB — URINALYSIS, ROUTINE W REFLEX MICROSCOPIC
Bacteria, UA: NONE SEEN
Bilirubin Urine: NEGATIVE
Glucose, UA: NEGATIVE mg/dL
Hgb urine dipstick: NEGATIVE
Ketones, ur: 80 mg/dL — AB
Nitrite: NEGATIVE
Protein, ur: 30 mg/dL — AB
Specific Gravity, Urine: 1.027 (ref 1.005–1.030)
pH: 5 (ref 5.0–8.0)

## 2019-02-14 LAB — CBC
HCT: 46.6 % — ABNORMAL HIGH (ref 36.0–46.0)
Hemoglobin: 15.2 g/dL — ABNORMAL HIGH (ref 12.0–15.0)
MCH: 29.8 pg (ref 26.0–34.0)
MCHC: 32.6 g/dL (ref 30.0–36.0)
MCV: 91.4 fL (ref 80.0–100.0)
Platelets: 151 10*3/uL (ref 150–400)
RBC: 5.1 MIL/uL (ref 3.87–5.11)
RDW: 12.9 % (ref 11.5–15.5)
WBC: 6.3 10*3/uL (ref 4.0–10.5)
nRBC: 0 % (ref 0.0–0.2)

## 2019-02-14 LAB — COMPREHENSIVE METABOLIC PANEL
ALT: 14 U/L (ref 0–44)
AST: 23 U/L (ref 15–41)
Albumin: 4.9 g/dL (ref 3.5–5.0)
Alkaline Phosphatase: 101 U/L (ref 38–126)
Anion gap: 11 (ref 5–15)
BUN: 9 mg/dL (ref 6–20)
CO2: 26 mmol/L (ref 22–32)
Calcium: 10.1 mg/dL (ref 8.9–10.3)
Chloride: 102 mmol/L (ref 98–111)
Creatinine, Ser: 1.01 mg/dL — ABNORMAL HIGH (ref 0.44–1.00)
GFR calc Af Amer: >60 mL/min (ref >60)
GFR calc non Af Amer: >60 mL/min (ref >60)
Glucose, Bld: 118 mg/dL — ABNORMAL HIGH (ref 70–99)
Potassium: 4.4 mmol/L (ref 3.5–5.1)
Sodium: 139 mmol/L (ref 135–145)
Total Bilirubin: 1 mg/dL (ref 0.3–1.2)
Total Protein: 8.6 g/dL — ABNORMAL HIGH (ref 6.5–8.1)

## 2019-02-14 LAB — CBG MONITORING, ED: Glucose-Capillary: 99 mg/dL (ref 70–99)

## 2019-02-14 LAB — I-STAT BETA HCG BLOOD, ED (MC, WL, AP ONLY): I-stat hCG, quantitative: <5 m[IU]/mL (ref <5)

## 2019-02-14 LAB — LIPASE, BLOOD: Lipase: 19 U/L (ref 11–51)

## 2019-02-14 MED ORDER — SODIUM CHLORIDE 0.9 % IV BOLUS (SEPSIS)
1000.0000 mL | Freq: Once | INTRAVENOUS | Status: AC
  Administered 2019-02-14: 1000 mL via INTRAVENOUS

## 2019-02-14 MED ORDER — ONDANSETRON HCL 4 MG/2ML IJ SOLN
4.0000 mg | Freq: Once | INTRAMUSCULAR | Status: AC
  Administered 2019-02-14: 4 mg via INTRAVENOUS
  Filled 2019-02-14: qty 2

## 2019-02-14 MED ORDER — ONDANSETRON HCL 4 MG PO TABS: 4.0000 mg | ORAL_TABLET | Freq: Four times a day (QID) | ORAL | 0 refills | Status: DC

## 2019-02-14 MED ORDER — KETOROLAC TROMETHAMINE 15 MG/ML IJ SOLN
15.0000 mg | Freq: Once | INTRAMUSCULAR | Status: AC
  Administered 2019-02-14: 15 mg via INTRAVENOUS
  Filled 2019-02-14: qty 1

## 2019-02-14 MED ORDER — SODIUM CHLORIDE 0.9% FLUSH: 3.0000 mL | Freq: Once | INTRAVENOUS | Status: DC

## 2019-02-14 NOTE — ED Notes (Signed)
Patient transported to X-ray 

## 2019-02-14 NOTE — ED Notes (Signed)
Patient returned from X-ray 

## 2019-02-14 NOTE — ED Provider Notes (Signed)
[facility] EMERGENCY DEPARTMENT Provider Note   CSN: [id number] Arrival date & time: 02/13/19  2255     History   Chief Complaint Chief Complaint  Patient presents with   Abdominal Pain    HPI Alexis [name] is a 23 y.o. female.     23 year old female with past medical history of depression, anxiety, headache, heart disease presents emergency department for abdominal pain and nausea vomiting.  Patient reports that this began on Monday.  Reports that she gets GI bugs a lot.  Reports that she has not been able to hold down any kind of food or liquid since Monday.  Reports several episodes of nonbloody, nonbilious vomiting.  She reports that she does not remember her last bowel movement.  Has had previous heart and unknown abdominal surgeries in the past.  Denies any dysuria.  Denies vaginal bleeding.  Reports that she has never been sexually active.  Reports that he she was seen by another provider yesterday and was given Zofran but thinks that she vomited it back up.  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      Home Medications    Prior to Admission medications   Medication Sig Start Date End Date  Taking? Authorizing Provider  benztropine (COGENTIN) 1 MG tablet Take 1/2-1 tablet by mouth twice a day for jaw clenching 02/02/19   Cottle, Steva Ready., MD  lithium carbonate 300 MG capsule Take 4 capsules (1,200 mg total) by mouth daily. 02/01/19   Cottle, Steva Ready., MD  LORazepam (ATIVAN) 0.5 MG tablet Take 1 tablet (0.5 mg total) by mouth every 8 (eight) hours as needed for anxiety. 11/09/18   Melony Overly T, PA-C  ondansetron (ZOFRAN) 4 MG tablet Take 1 tablet (4 mg total) by mouth every 6 (six) hours. 02/14/19   Ronnie Doss A, PA-C  ziprasidone (GEODON) 20 MG capsule 1 in evening Patient taking differently: 80 mg in evening 01/29/19   Cottle, Steva Ready., MD  ziprasidone (GEODON) 20 MG capsule Take 1 capsule (20 mg total) by mouth 2 (two) times daily with a meal. 02/12/19   Cottle, Steva Ready., MD  QUEtiapine (SEROQUEL XR) 400 MG 24 hr tablet Take 1 tablet (400 mg total) by mouth at bedtime. Patient not taking: Reported on 11/13/2018 11/10/18 11/13/18  [name]    Family History Family History  Problem Relation Age of Onset   Hypertension Maternal Grandmother    Diabetes Maternal Grandfather    Heart disease Maternal Grandfather    Stroke Maternal Grandfather  Hypertension Mother    Healthy Father     Social History Social History   Tobacco Use   Smoking status: Never Smoker   Smokeless tobacco: Never Used  Substance Use Topics   Alcohol use: Yes    Comment: occasional   Drug use: Not Currently    Types: Marijuana     Allergies   Dopamine   Review of Systems Review of Systems  Constitutional: Positive for fever (subjective). Negative for activity change, appetite change and fatigue.  HENT: Negative for congestion and sore throat.   Respiratory: Negative for cough and shortness of breath.   Cardiovascular: Negative for chest pain.  Gastrointestinal: Positive for abdominal pain, constipation, nausea and vomiting. Negative for diarrhea.    Endocrine: Negative for polyuria.  Genitourinary: Negative for decreased urine volume, difficulty urinating, dysuria, pelvic pain, urgency, vaginal bleeding and vaginal discharge.  Musculoskeletal: Negative for back pain and myalgias.  Skin: Negative for rash.  Neurological: Negative for dizziness, light-headedness and headaches.     Physical Exam Updated Vital Signs BP 120/64    Pulse (!) 59    Temp 98.5 F (36.9 C) (Oral)    Resp 20    LMP 01/30/2019    SpO2 98%   Physical Exam Vitals signs and nursing note reviewed.  Constitutional:      General: She is not in acute distress.    Appearance: Normal appearance. She is well-developed. She is not ill-appearing, toxic-appearing or diaphoretic.  HENT:     Head: Normocephalic.     Mouth/Throat:     Mouth: Mucous membranes are moist.  Eyes:     Conjunctiva/sclera: Conjunctivae normal.  Cardiovascular:     Rate and Rhythm: Normal rate and regular rhythm.  Pulmonary:     Effort: Pulmonary effort is normal.  Abdominal:     General: Abdomen is flat. A surgical scar is present. Bowel sounds are normal. There is no distension or abdominal bruit. There are no signs of injury.     Palpations: Abdomen is soft.     Tenderness: There is abdominal tenderness in the left lower quadrant. There is no right CVA tenderness, left CVA tenderness, guarding or rebound. Negative signs include Murphy's sign and Rovsing's sign.  Skin:    General: Skin is dry.  Neurological:     Mental Status: She is alert and oriented to person, place, and time.  Psychiatric:        Mood and Affect: Mood normal.      ED Treatments / Results  Labs (all labs ordered are listed, but only abnormal results are displayed) Labs Reviewed  COMPREHENSIVE METABOLIC PANEL - Abnormal; Notable for the following components:      Result Value   Glucose, Bld 118 (*)    Creatinine, Ser 1.01 (*)    Total Protein 8.6 (*)    All other components within normal limits  CBC -  Abnormal; Notable for the following components:   Hemoglobin 15.2 (*)    HCT 46.6 (*)    All other components within normal limits  URINALYSIS, ROUTINE W REFLEX MICROSCOPIC - Abnormal; Notable for the following components:   APPearance HAZY (*)    Ketones, ur 80 (*)    Protein, ur 30 (*)    Leukocytes,Ua TRACE (*)    All other components within normal limits  LIPASE, BLOOD  I-STAT BETA HCG BLOOD, ED ([facility], [facility], [facility] ONLY)  CBG MONITORING, ED    EKG None  Radiology Dg Abdomen 1 View  Result  Date: 02/14/2019 CLINICAL DATA:  Acute right-sided abdominal pain. EXAM: ABDOMEN - 1 VIEW COMPARISON:  Radiographs of November 13, 2018. FINDINGS: The bowel gas pattern is normal. No radio-opaque calculi or other significant radiographic abnormality are seen. IMPRESSION: Negative. Electronically Signed   By: Lupita RaiderJames  Green Jr M.D.   On: 02/14/2019 08:09    Procedures Procedures (including critical care time)  Medications Ordered in ED Medications  sodium chloride flush (NS) 0.9 % injection 3 mL (3 mLs Intravenous Not Given 02/14/19 1000)  sodium chloride 0.9 % bolus 1,000 mL (0 mLs Intravenous Stopped 02/14/19 1005)  ondansetron (ZOFRAN) injection 4 mg (4 mg Intravenous Given 02/14/19 0852)  ketorolac (TORADOL) 15 MG/ML injection 15 mg (15 mg Intravenous Given 02/14/19 0851)     Initial Impression / Assessment and Plan / ED Course  I have reviewed the triage vital signs and the nursing notes.  Pertinent labs & imaging results that were available during my care of the patient were reviewed by me and considered in my medical decision making (see chart for details).  Clinical Course as of Feb 13 1053  Wed Feb 14, 2019  0921 23 y/o F here with abdominal pain since Monday. Reports n/v. Has not had any vomiting in the 10 hours here in the ED. Reports improved with toradol and zofran and fluids and now just feeling "tired". Will continue to observe and PO trial her. Labs are reassuring and negative abdominal  xray and urine. No peritoneal signs on exam   [KM]  1051 Patient tolerated PO fluids, cheese and crackers. Serial exams unchanged without peritoneal signs. Patient continues to improve with fluids, zofran and Toradol. Will d/c home with strict return precautions.    [KM]    Clinical Course User Index [KM] Arlyn DunningMcLean, Eduarda Scrivens A, PA-C       Based on review of vitals, medical screening exam, lab work and/or imaging, there does not appear to be an acute, emergent etiology for the patient's symptoms. Counseled pt on good return precautions and encouraged both PCP and ED follow-up as needed.  Prior to discharge, I also discussed incidental imaging findings with patient in detail and advised appropriate, recommended follow-up in detail.  Clinical Impression: 1. Lower abdominal pain     Disposition: Discharge  Prior to providing a prescription for a controlled substance, I independently reviewed the patient's recent prescription history on the [place] Controlled Substance Reporting System. The patient had no recent or regular prescriptions and was deemed appropriate for a brief, less than 3 day prescription of narcotic for acute analgesia.  This note was prepared with assistance of Conservation officer, historic buildingsDragon voice recognition software. Occasional wrong-word or sound-a-like substitutions may have occurred due to the inherent limitations of voice recognition software.   Final Clinical Impressions(s) / ED Diagnoses   Final diagnoses:  Lower abdominal pain    ED Discharge Orders         Ordered    ondansetron (ZOFRAN) 4 MG tablet  Every 6 hours     02/14/19 1053           Jeral PinchMcLean, Lavaughn Bisig A, PA-C 02/14/19 1054    Benjiman CorePickering, Nathan, MD 02/15/19 [phone]

## 2019-02-14 NOTE — Discharge Instructions (Signed)
Thank you for allowing me to care for you today. Please return to the emergency department if you have new or worsening symptoms. Take your medications as instructed.  ° °

## 2019-02-14 NOTE — ED Notes (Signed)
Pt is lying down on bench and pt given a warm blanket and a blanket for a pillow. Pt mother is sitting in the area where the wheel chairs are kept. Mother states it is cold in the car and she is given a warm blanket

## 2019-02-14 NOTE — ED Notes (Signed)
Pt complained of dizziness. RN [place] informed. And of mothers concern of pt cardiac history

## 2019-02-14 NOTE — ED Notes (Signed)
Pt verbalized understanding of D/C instructions and follow up care. IV removed and bleeding controlled.  Pt ambulatory to lobby with steady gait. This RN walked forgotten charger up to pt in lobby and returned.

## 2019-02-19 ENCOUNTER — Encounter (HOSPITAL_COMMUNITY): Payer: Self-pay

## 2019-02-19 ENCOUNTER — Other Ambulatory Visit: Payer: Self-pay

## 2019-02-19 ENCOUNTER — Emergency Department (HOSPITAL_COMMUNITY)
Admission: EM | Admit: 2019-02-19 | Discharge: 2019-02-20 | Disposition: A | Discharge status: Home / Self Care | Payer: Federal, State, Local not specified - PPO | Attending: Emergency Medicine | Admitting: Emergency Medicine

## 2019-02-19 DIAGNOSIS — R112 Nausea with vomiting, unspecified: Secondary | ICD-10-CM

## 2019-02-19 DIAGNOSIS — R1084 Generalized abdominal pain: Secondary | ICD-10-CM | POA: Diagnosis not present

## 2019-02-19 DIAGNOSIS — Z79899 Other long term (current) drug therapy: Secondary | ICD-10-CM | POA: Diagnosis not present

## 2019-02-19 DIAGNOSIS — R109 Unspecified abdominal pain: Secondary | ICD-10-CM | POA: Diagnosis not present

## 2019-02-19 DIAGNOSIS — N3000 Acute cystitis without hematuria: Secondary | ICD-10-CM | POA: Diagnosis not present

## 2019-02-19 DIAGNOSIS — R1110 Vomiting, unspecified: Secondary | ICD-10-CM | POA: Diagnosis not present

## 2019-02-19 MED ORDER — SODIUM CHLORIDE 0.9% FLUSH: 3.0000 mL | Freq: Once | INTRAVENOUS | Status: DC

## 2019-02-19 NOTE — ED Triage Notes (Signed)
Pt arrived stating she has been vomiting since 9/28 and has been seen by multiple providers since with no relief. States she is vomiting up water and unable to keep her OTD zofran down. Also stating she has neck and back pain that spreads throughout her body.

## 2019-02-20 ENCOUNTER — Emergency Department (HOSPITAL_COMMUNITY): Payer: Federal, State, Local not specified - PPO

## 2019-02-20 ENCOUNTER — Encounter (HOSPITAL_COMMUNITY): Payer: Self-pay | Admitting: Emergency Medicine

## 2019-02-20 DIAGNOSIS — R1110 Vomiting, unspecified: Secondary | ICD-10-CM | POA: Diagnosis not present

## 2019-02-20 DIAGNOSIS — N83201 Unspecified ovarian cyst, right side: Secondary | ICD-10-CM

## 2019-02-20 HISTORY — DX: Unspecified ovarian cyst, right side: N83.201

## 2019-02-20 LAB — URINALYSIS, ROUTINE W REFLEX MICROSCOPIC
Bilirubin Urine: NEGATIVE
Glucose, UA: NEGATIVE mg/dL
Hgb urine dipstick: NEGATIVE
Ketones, ur: 80 mg/dL — AB
Leukocytes,Ua: NEGATIVE
Nitrite: NEGATIVE
Protein, ur: 100 mg/dL — AB
Specific Gravity, Urine: 1.034 — ABNORMAL HIGH (ref 1.005–1.030)
WBC, UA: >50 WBC/hpf — ABNORMAL HIGH (ref 0–5)
pH: 5 (ref 5.0–8.0)

## 2019-02-20 LAB — CBC
HCT: 49.6 % — ABNORMAL HIGH (ref 36.0–46.0)
Hemoglobin: 15.6 g/dL — ABNORMAL HIGH (ref 12.0–15.0)
MCH: 28.7 pg (ref 26.0–34.0)
MCHC: 31.5 g/dL (ref 30.0–36.0)
MCV: 91.3 fL (ref 80.0–100.0)
Platelets: 143 10*3/uL — ABNORMAL LOW (ref 150–400)
RBC: 5.43 MIL/uL — ABNORMAL HIGH (ref 3.87–5.11)
RDW: 13 % (ref 11.5–15.5)
WBC: 6.6 10*3/uL (ref 4.0–10.5)
nRBC: 0 % (ref 0.0–0.2)

## 2019-02-20 LAB — COMPREHENSIVE METABOLIC PANEL
ALT: 21 U/L (ref 0–44)
AST: 57 U/L — ABNORMAL HIGH (ref 15–41)
Albumin: 5.2 g/dL — ABNORMAL HIGH (ref 3.5–5.0)
Alkaline Phosphatase: 98 U/L (ref 38–126)
Anion gap: 12 (ref 5–15)
BUN: 11 mg/dL (ref 6–20)
CO2: 25 mmol/L (ref 22–32)
Calcium: 9.9 mg/dL (ref 8.9–10.3)
Chloride: 100 mmol/L (ref 98–111)
Creatinine, Ser: 1.27 mg/dL — ABNORMAL HIGH (ref 0.44–1.00)
GFR calc Af Amer: >60 mL/min (ref >60)
GFR calc non Af Amer: 60 mL/min — ABNORMAL LOW (ref >60)
Glucose, Bld: 97 mg/dL (ref 70–99)
Potassium: 5.9 mmol/L — ABNORMAL HIGH (ref 3.5–5.1)
Sodium: 137 mmol/L (ref 135–145)
Total Bilirubin: 1.3 mg/dL — ABNORMAL HIGH (ref 0.3–1.2)
Total Protein: 8.7 g/dL — ABNORMAL HIGH (ref 6.5–8.1)

## 2019-02-20 LAB — RAPID URINE DRUG SCREEN, HOSP PERFORMED
Amphetamines: NOT DETECTED
Barbiturates: NOT DETECTED
Benzodiazepines: NOT DETECTED
Cocaine: NOT DETECTED
Opiates: NOT DETECTED
Tetrahydrocannabinol: NOT DETECTED

## 2019-02-20 LAB — I-STAT BETA HCG BLOOD, ED (MC, WL, AP ONLY): I-stat hCG, quantitative: <5 m[IU]/mL (ref <5)

## 2019-02-20 LAB — POTASSIUM: Potassium: 3.7 mmol/L (ref 3.5–5.1)

## 2019-02-20 LAB — LITHIUM LEVEL: Lithium Lvl: <0.06 mmol/L — ABNORMAL LOW (ref 0.60–1.20)

## 2019-02-20 MED ORDER — SODIUM CHLORIDE 0.9 % IV BOLUS
1000.0000 mL | Freq: Once | INTRAVENOUS | Status: AC
  Administered 2019-02-20: 1000 mL via INTRAVENOUS

## 2019-02-20 MED ORDER — NITROFURANTOIN MONOHYD MACRO 100 MG PO CAPS
100.0000 mg | ORAL_CAPSULE | Freq: Once | ORAL | Status: DC
  Filled 2019-02-20: qty 1

## 2019-02-20 MED ORDER — IOHEXOL 300 MG/ML  SOLN
100.0000 mL | Freq: Once | INTRAMUSCULAR | Status: AC | PRN
  Administered 2019-02-20: 100 mL via INTRAVENOUS

## 2019-02-20 MED ORDER — NITROFURANTOIN MONOHYD MACRO 100 MG PO CAPS: 100.0000 mg | ORAL_CAPSULE | Freq: Two times a day (BID) | ORAL | 0 refills | Status: DC

## 2019-02-20 MED ORDER — PROMETHAZINE HCL 25 MG RE SUPP: 25.0000 mg | Freq: Three times a day (TID) | RECTAL | 0 refills | Status: DC | PRN

## 2019-02-20 MED ORDER — HALOPERIDOL LACTATE 5 MG/ML IJ SOLN
5.0000 mg | Freq: Once | INTRAMUSCULAR | Status: AC
  Administered 2019-02-20: 5 mg via INTRAVENOUS
  Filled 2019-02-20: qty 1

## 2019-02-20 MED ORDER — SODIUM CHLORIDE (PF) 0.9 % IJ SOLN
INTRAMUSCULAR | Status: AC
  Filled 2019-02-20: qty 50

## 2019-02-20 NOTE — ED Notes (Addendum)
Pt has had no episodes of emesis while at hospital. Instructed to inform staff if any occur.

## 2019-02-20 NOTE — ED Notes (Addendum)
Pt ambulated to the bathroom without assistance. Gait steady. No reports of dizziness.

## 2019-02-20 NOTE — ED Notes (Addendum)
IV team at bedside. Medication will be administered once IV established

## 2019-02-20 NOTE — ED Notes (Addendum)
Pt provided with water

## 2019-02-20 NOTE — ED Notes (Addendum)
No reports of emesis for PO challenge.

## 2019-02-20 NOTE — ED Notes (Addendum)
Lithium tube and lavender tube hemolyzed. Phlebotomy contacted by lab to come stick pt

## 2019-02-20 NOTE — ED Notes (Signed)
Blood draw attempted x1 unsuccessful. Pt's mother stated that pt is an incredibly hard stick and often requires an ultrasound IV.

## 2019-02-20 NOTE — ED Notes (Signed)
Pt transported to CT ?

## 2019-02-20 NOTE — ED Provider Notes (Signed)
[facility]-EMERGENCY DEPT Provider Note   CSN: [id number] Arrival date & time: 02/19/19  2035     History   Chief Complaint Chief Complaint  Patient presents with   Emesis    HPI Alexis [name] is a 23 y.o. female.     The history is provided by the patient.  Emesis Severity:  Severe Timing:  Intermittent Quality:  Stomach contents Progression:  Unchanged Chronicity:  Recurrent Recent urination:  Normal Context: not post-tussive   Relieved by:  Nothing Worsened by:  Nothing Ineffective treatments:  None tried Associated symptoms: no abdominal pain, no arthralgias, no chills, no cough, no diarrhea, no fever, no headaches, no myalgias, no sore throat and no URI   Risk factors: no diabetes and not pregnant   Patient with GAD and MDD presents with nausea and vomiting.  Seen by multiple doctors including GI earlier in the day without diagnosis.  No f/c/r.  No diarrhea.    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      Home Medications    Prior to Admission medications   Medication Sig Start Date End Date Taking? Authorizing Provider  lithium carbonate 300 MG capsule Take 4 capsules (1,200 mg total) by mouth daily. 02/01/19  Yes Cottle, Steva Ready., MD    ondansetron (ZOFRAN) 4 MG tablet Take 1 tablet (4 mg total) by mouth every 6 (six) hours. 02/14/19  Yes Ronnie Doss A, PA-C  ziprasidone (GEODON) 20 MG capsule Take 1 capsule (20 mg total) by mouth 2 (two) times daily with a meal. Patient taking differently: Take 20-80 mg by mouth 2 (two) times daily with a meal. Take 1 capsule (20 mg) in the morning and Take 4 capsules (80 mg) at bedtime 02/12/19  Yes Cottle, Steva Ready., MD  benztropine (COGENTIN) 1 MG tablet Take 1/2-1 tablet by mouth twice a day for jaw clenching Patient not taking: Reported on 02/20/2019 02/02/19   Cottle, Steva Ready., MD  LORazepam (ATIVAN) 0.5 MG tablet Take 1 tablet (0.5 mg total) by mouth every 8 (eight) hours as needed for anxiety. Patient not taking: Reported on 02/20/2019 11/09/18   Melony Overly T, PA-C  nitrofurantoin, macrocrystal-monohydrate, (MACROBID) 100 MG capsule Take 1 capsule (100 mg total) by mouth 2 (two) times daily. X 7 days 02/20/19   Maelee Hoot, MD  promethazine (PHENERGAN) 25 MG suppository Place 1 suppository (25 mg total) rectally every 8 (eight) hours as needed for nausea or vomiting. 02/20/19   Janica Eldred, MD  ziprasidone (GEODON) 20 MG capsule 1 in evening Patient not taking: Reported on 02/20/2019 01/29/19   Cottle, Steva Ready., MD  QUEtiapine (SEROQUEL  XR) 400 MG 24 hr tablet Take 1 tablet (400 mg total) by mouth at bedtime. Patient not taking: Reported on 11/13/2018 11/10/18 11/13/18  Addison Lank, PA-C    Family History Family History  Problem Relation Age of Onset   Hypertension Maternal Grandmother    Diabetes Maternal Grandfather    Heart disease Maternal Grandfather    Stroke Maternal Grandfather    Hypertension Mother    Healthy Father     Social History Social History   Tobacco Use   Smoking status: Never Smoker   Smokeless tobacco: Never Used  Substance Use Topics   Alcohol use: Yes    Comment: occasional   Drug use: Not Currently    Types: Marijuana      Allergies   Dopamine   Review of Systems Review of Systems  Constitutional: Negative for chills and fever.  HENT: Negative for sore throat.   Eyes: Negative for visual disturbance.  Respiratory: Negative for cough.   Gastrointestinal: Positive for nausea and vomiting. Negative for abdominal pain, constipation and diarrhea.  Genitourinary: Negative for difficulty urinating and dysuria.  Musculoskeletal: Negative for arthralgias and myalgias.  Neurological: Negative for headaches.  Psychiatric/Behavioral: Negative for agitation.  All other systems reviewed and are negative.    Physical Exam Updated Vital Signs BP (!) 117/54    Pulse 88    Temp 98.4 F (36.9 C) (Oral)    Resp 19    Ht 5' 5.25" (1.657 m)    Wt 67.6 kg    LMP 01/30/2019 Comment: negative beta HCG 02/20/19   SpO2 100%    BMI 24.61 kg/m   Physical Exam Vitals signs and nursing note reviewed.  Constitutional:      General: She is not in acute distress.    Appearance: She is normal weight.     Comments: Well appearing, smiling  HENT:     Head: Normocephalic and atraumatic.     Nose: Nose normal.  Eyes:     Conjunctiva/sclera: Conjunctivae normal.     Pupils: Pupils are equal, round, and reactive to light.  Neck:     Musculoskeletal: Normal range of motion and neck supple.  Cardiovascular:     Rate and Rhythm: Normal rate and regular rhythm.     Pulses: Normal pulses.     Heart sounds: Normal heart sounds.  Pulmonary:     Effort: Pulmonary effort is normal.     Breath sounds: Normal breath sounds.  Abdominal:     General: Abdomen is flat. Bowel sounds are normal.     Tenderness: There is no abdominal tenderness. There is no guarding or rebound.  Musculoskeletal: Normal range of motion.  Skin:    General: Skin is warm and dry.     Capillary Refill: Capillary refill takes less than 2 seconds.  Neurological:     General: No focal deficit present.     Mental Status: She is alert and oriented to person,  place, and time.  Psychiatric:        Mood and Affect: Mood normal.        Behavior: Behavior normal.      ED Treatments / Results  Labs (all labs ordered are listed, but only abnormal results are displayed) Results for orders placed or performed during the hospital encounter of 02/19/19  Comprehensive metabolic panel  Result Value Ref Range   Sodium 137 135 - 145 mmol/L   Potassium 5.9 (H) 3.5 - 5.1 mmol/L   Chloride 100 98 -  111 mmol/L   CO2 25 22 - 32 mmol/L   Glucose, Bld 97 70 - 99 mg/dL   BUN 11 6 - 20 mg/dL   Creatinine, Ser 4.091.27 (H) 0.44 - 1.00 mg/dL   Calcium 9.9 8.9 - 81.110.3 mg/dL   Total Protein 8.7 (H) 6.5 - 8.1 g/dL   Albumin 5.2 (H) 3.5 - 5.0 g/dL   AST 57 (H) 15 - 41 U/L   ALT 21 0 - 44 U/L   Alkaline Phosphatase 98 38 - 126 U/L   Total Bilirubin 1.3 (H) 0.3 - 1.2 mg/dL   GFR calc non Af Amer 60 (L) >60 mL/min   GFR calc Af Amer >60 >60 mL/min   Anion gap 12 5 - 15  Urinalysis, Routine w reflex microscopic  Result Value Ref Range   Color, Urine YELLOW (A) YELLOW   APPearance TURBID (A) CLEAR   Specific Gravity, Urine 1.034 (H) 1.005 - 1.030   pH 5.0 5.0 - 8.0   Glucose, UA NEGATIVE NEGATIVE mg/dL   Hgb urine dipstick NEGATIVE NEGATIVE   Bilirubin Urine NEGATIVE NEGATIVE   Ketones, ur 80 (A) NEGATIVE mg/dL   Protein, ur 914100 (A) NEGATIVE mg/dL   Nitrite NEGATIVE NEGATIVE   Leukocytes,Ua NEGATIVE NEGATIVE   WBC, UA >50 (H) 0 - 5 WBC/hpf   Bacteria, UA RARE (A) NONE SEEN   Squamous Epithelial / LPF 0-5 0 - 5   WBC Clumps PRESENT    Mucus PRESENT   Rapid urine drug screen (hospital performed)  Result Value Ref Range   Opiates NONE DETECTED NONE DETECTED   Cocaine NONE DETECTED NONE DETECTED   Benzodiazepines NONE DETECTED NONE DETECTED   Amphetamines NONE DETECTED NONE DETECTED   Tetrahydrocannabinol NONE DETECTED NONE DETECTED   Barbiturates NONE DETECTED NONE DETECTED  Potassium  Result Value Ref Range   Potassium 3.7 3.5 - 5.1 mmol/L  CBC   Result Value Ref Range   WBC 6.6 4.0 - 10.5 K/uL   RBC 5.43 (H) 3.87 - 5.11 MIL/uL   Hemoglobin 15.6 (H) 12.0 - 15.0 g/dL   HCT 78.249.6 (H) 95.636.0 - 21.346.0 %   MCV 91.3 80.0 - 100.0 fL   MCH 28.7 26.0 - 34.0 pg   MCHC 31.5 30.0 - 36.0 g/dL   RDW 08.613.0 57.811.5 - 46.915.5 %   Platelets 143 (L) 150 - 400 K/uL   nRBC 0.0 0.0 - 0.2 %  Lithium level  Result Value Ref Range   Lithium Lvl <0.06 (L) 0.60 - 1.20 mmol/L  I-Stat beta hCG blood, ED  Result Value Ref Range   I-stat hCG, quantitative <5.0 <5 mIU/mL   Comment 3           Dg Abdomen 1 View  Result Date: 02/14/2019 CLINICAL DATA:  Acute right-sided abdominal pain. EXAM: ABDOMEN - 1 VIEW COMPARISON:  Radiographs of November 13, 2018. FINDINGS: The bowel gas pattern is normal. No radio-opaque calculi or other significant radiographic abnormality are seen. IMPRESSION: Negative. Electronically Signed   By: Lupita RaiderJames  Green Jr M.D.   On: 02/14/2019 08:09   Ct Abdomen Pelvis W Contrast  Result Date: 02/20/2019 CLINICAL DATA:  Nausea vomiting EXAM: CT ABDOMEN AND PELVIS WITH CONTRAST TECHNIQUE: Multidetector CT imaging of the abdomen and pelvis was performed using the standard protocol following bolus administration of intravenous contrast. CONTRAST:  100mL OMNIPAQUE IOHEXOL 300 MG/ML  SOLN COMPARISON:  Radiograph February 14, 2019, CT October 22/2017 FINDINGS: Lower chest: The visualized heart size within normal limits. No  pericardial fluid/thickening. No hiatal hernia. Again noted are surgical clips within the lower posterior mediastinum. The visualized portions of the lungs are clear. Hepatobiliary: The liver is normal in density without focal abnormality.The main portal vein is patent. No evidence of calcified gallstones, gallbladder wall thickening or biliary dilatation. Pancreas: Unremarkable. No pancreatic ductal dilatation or surrounding inflammatory changes. Spleen: Normal in size without focal abnormality. Adrenals/Urinary Tract: Both adrenal glands appear  normal. The kidneys and collecting system appear normal without evidence of urinary tract calculus or hydronephrosis. Bladder is unremarkable. Stomach/Bowel: The stomach, small bowel, and colon are normal in appearance. No inflammatory changes, wall thickening, or obstructive findings.The appendix is normal. Vascular/Lymphatic: There are no enlarged mesenteric, retroperitoneal, or pelvic lymph nodes. No significant vascular findings are present. Reproductive: There is a 3.7 cm low-density mass within the right adnexa, likely ovarian cyst. A small amount of fluid is seen within the endometrial canal. Trace fluid seen within the gutter. Other: No evidence of abdominal wall mass or hernia. Musculoskeletal: No acute or significant osseous findings. IMPRESSION: 3.7 cm right ovarian cyst. No other acute intra-abdominal or pelvic pathology. Electronically Signed   By: Jonna Clark M.D.   On: 02/20/2019 03:43    EKG EKG Interpretation  Date/Time:  Tuesday February 20 2019 00:02:13 EDT Ventricular Rate:  84 PR Interval:    QRS Duration: 99 QT Interval:  370 QTC Calculation: 438 R Axis:   80 Text Interpretation:  Sinus or ectopic atrial rhythm Short PR interval Borderline T wave abnormalities Confirmed by Nicanor Alcon, Dallyn Bergland ([id number]) on 02/20/2019 1:09:48 AM   Radiology Ct Abdomen Pelvis W Contrast  Result Date: 02/20/2019 CLINICAL DATA:  Nausea vomiting EXAM: CT ABDOMEN AND PELVIS WITH CONTRAST TECHNIQUE: Multidetector CT imaging of the abdomen and pelvis was performed using the standard protocol following bolus administration of intravenous contrast. CONTRAST:  OMNIPAQUE IOHEXOL 300 MG/ML  SOLN COMPARISON:  Radiograph February 14, 2019, CT October 22/2017 FINDINGS: Lower chest: The visualized heart size within normal limits. No pericardial fluid/thickening. No hiatal hernia. Again noted are surgical clips within the lower posterior mediastinum. The visualized portions of the lungs are clear. Hepatobiliary:  The liver is normal in density without focal abnormality.The main portal vein is patent. No evidence of calcified gallstones, gallbladder wall thickening or biliary dilatation. Pancreas: Unremarkable. No pancreatic ductal dilatation or surrounding inflammatory changes. Spleen: Normal in size without focal abnormality. Adrenals/Urinary Tract: Both adrenal glands appear normal. The kidneys and collecting system appear normal without evidence of urinary tract calculus or hydronephrosis. Bladder is unremarkable. Stomach/Bowel: The stomach, small bowel, and colon are normal in appearance. No inflammatory changes, wall thickening, or obstructive findings.The appendix is normal. Vascular/Lymphatic: There are no enlarged mesenteric, retroperitoneal, or pelvic lymph nodes. No significant vascular findings are present. Reproductive: There is a 3.7 cm low-density mass within the right adnexa, likely ovarian cyst. A small amount of fluid is seen within the endometrial canal. Trace fluid seen within the gutter. Other: No evidence of abdominal wall mass or hernia. Musculoskeletal: No acute or significant osseous findings. IMPRESSION: 3.7 cm right ovarian cyst. No other acute intra-abdominal or pelvic pathology. Electronically Signed   By: Jonna Clark M.D.   On: 02/20/2019 03:43    Procedures Procedures (including critical care time)  Medications Ordered in ED Medications  sodium chloride flush (NS) 0.9 % injection 3 mL (3 mLs Intravenous Not Given 02/20/19 0024)  sodium chloride (PF) 0.9 % injection (has no administration in time range)  nitrofurantoin (macrocrystal-monohydrate) (MACROBID) capsule 100  mg (has no administration in time range)  haloperidol lactate (HALDOL) injection 5 mg (5 mg Intravenous Given 02/20/19 0155)  sodium chloride 0.9 % bolus 1,000 mL (0 mLs Intravenous Stopped 02/20/19 0548)  iohexol (OMNIPAQUE) 300 MG/ML solution 100 mL (100 mLs Intravenous Contrast Given 02/20/19 0317)     Urinated  without difficulty.  PO challenged without difficulty.  Normal electrolytes.  I believe this is psychiatric in nature and I also believe she is getting more in than she states.  She appears well hydrated. She is not tachycardiac.  She has held down liquids in the ED.  Stable for discharge with close follow up,  [name] Carmean was evaluated in Emergency Department on 02/20/2019 for the symptoms described in the history of present illness. She was evaluated in the context of the global COVID-19 pandemic, which necessitated consideration that the patient might be at risk for infection with the SARS-CoV-2 virus that causes COVID-19. Institutional protocols and algorithms that pertain to the evaluation of patients at risk for COVID-19 are in a state of rapid change based on information released by regulatory bodies including the CDC and federal and state organizations. These policies and algorithms were followed during the patient's care in the ED.   Final Clinical Impressions(s) / ED Diagnoses   Final diagnoses:  Non-intractable vomiting with nausea, unspecified vomiting type  Acute cystitis without hematuria  Return for intractable cough, coughing up blood,fevers >100.4 unrelieved by medication, shortness of breath, intractable vomiting, chest pain, shortness of breath, weakness,numbness, changes in speech, facial asymmetry,abdominal pain, passing out,Inability to tolerate liquids or food, cough, altered mental status or any concerns. No signs of systemic illness or infection. The patient is nontoxic-appearing on exam and vital signs are within normal limits.   I have reviewed the triage vital signs and the nursing notes. Pertinent labs &imaging results that were available during my care of the patient were reviewed by me and considered in my medical decision making (see chart for details).After history, exam, and medical workup I feel the patient has beenappropriately medically screened and is  safe for discharge home. Pertinent diagnoses were discussed with the patient. Patient was given return precautions.   ED Discharge Orders         Ordered    promethazine (PHENERGAN) 25 MG suppository  Every 8 hours PRN     02/20/19 0557    nitrofurantoin, macrocrystal-monohydrate, (MACROBID) 100 MG capsule  2 times daily     02/20/19 0557           Hykeem Ojeda, MD 02/20/19 [id number]

## 2019-02-20 NOTE — ED Notes (Signed)
Pt and mother verbalized discharge instructions and follow up care. Alert and ambulatory. No iv. Mother is driving pt home.

## 2019-02-20 NOTE — ED Notes (Signed)
Phlebotomy at bedside.

## 2019-02-22 ENCOUNTER — Other Ambulatory Visit: Payer: Self-pay | Admitting: Gastroenterology

## 2019-02-22 DIAGNOSIS — R109 Unspecified abdominal pain: Secondary | ICD-10-CM

## 2019-02-23 ENCOUNTER — Other Ambulatory Visit: Payer: Self-pay | Admitting: Psychiatry

## 2019-02-23 DIAGNOSIS — F251 Schizoaffective disorder, depressive type: Secondary | ICD-10-CM

## 2019-02-26 DIAGNOSIS — R112 Nausea with vomiting, unspecified: Secondary | ICD-10-CM | POA: Diagnosis not present

## 2019-02-26 DIAGNOSIS — N390 Urinary tract infection, site not specified: Secondary | ICD-10-CM | POA: Diagnosis not present

## 2019-03-02 ENCOUNTER — Other Ambulatory Visit: Payer: Self-pay | Admitting: Gastroenterology

## 2019-03-05 ENCOUNTER — Other Ambulatory Visit (HOSPITAL_COMMUNITY)
Admission: RE | Admit: 2019-03-05 | Discharge: 2019-03-05 | Disposition: A | Discharge status: Home / Self Care | Payer: Federal, State, Local not specified - PPO | Source: Ambulatory Visit | Attending: Gastroenterology | Admitting: Gastroenterology

## 2019-03-05 DIAGNOSIS — Z20828 Contact with and (suspected) exposure to other viral communicable diseases: Secondary | ICD-10-CM | POA: Diagnosis not present

## 2019-03-05 DIAGNOSIS — Z01812 Encounter for preprocedural laboratory examination: Secondary | ICD-10-CM | POA: Insufficient documentation

## 2019-03-06 LAB — NOVEL CORONAVIRUS, NAA (HOSP ORDER, SEND-OUT TO REF LAB; TAT 18-24 HRS): SARS-CoV-2, NAA: NOT DETECTED

## 2019-03-07 ENCOUNTER — Other Ambulatory Visit: Payer: Self-pay | Admitting: Psychiatry

## 2019-03-07 ENCOUNTER — Encounter (HOSPITAL_COMMUNITY): Payer: Self-pay

## 2019-03-07 DIAGNOSIS — F251 Schizoaffective disorder, depressive type: Secondary | ICD-10-CM

## 2019-03-07 NOTE — Progress Notes (Signed)
Attempted to obtain medical history via telephone, unable to reach at this time. I left a voicemail to return pre surgical testing department's phone call.  The following have been faxed to [phone]: Last office visit note from Dr. Ellyn Hack 01/23/2019 ECHO 01/25/2019 EKG 02/20/2019  Karoline Caldwell P.A. has note in epic 03/07/2019.

## 2019-03-07 NOTE — Telephone Encounter (Signed)
The epic med list is incorrect and I am not sure with the correct amount of ziprasidone is.  I believe is 20 mg in the morning and 80 at night but please call the patient and verify what she is currently taking.

## 2019-03-07 NOTE — Progress Notes (Signed)
Attempted pre op call, phone number not valid.

## 2019-03-07 NOTE — Anesthesia Preprocedure Evaluation (Addendum)
Anesthesia Evaluation  Patient identified by MRN, date of birth, ID band Patient awake    Reviewed: Allergy & Precautions, NPO status , Patient's Chart, lab work & pertinent test results  Airway Mallampati: I       Dental no notable dental hx. (+) Teeth Intact   Pulmonary neg pulmonary ROS,    Pulmonary exam normal breath sounds clear to auscultation       Cardiovascular negative cardio ROS Normal cardiovascular exam Rhythm:Regular Rate:Normal     Neuro/Psych PSYCHIATRIC DISORDERS Anxiety Depression Bipolar Disorder Schizophrenia    GI/Hepatic negative GI ROS, Neg liver ROS,   Endo/Other  negative endocrine ROS  Renal/GU negative Renal ROS     Musculoskeletal   Abdominal Normal abdominal exam  (+)   Peds  Hematology   Anesthesia Other Findings   Reproductive/Obstetrics                             Anesthesia Physical Anesthesia Plan  ASA: II  Anesthesia Plan: MAC   Post-op Pain Management:    Induction:   PONV Risk Score and Plan:   Airway Management Planned: Nasal Cannula, Natural Airway and Mask  Additional Equipment: None  Intra-op Plan:   Post-operative Plan:   Informed Consent: I have reviewed the patients History and Physical, chart, labs and discussed the procedure including the risks, benefits and alternatives for the proposed anesthesia with the patient or authorized representative who has indicated his/her understanding and acceptance.     Dental advisory given  Plan Discussed with: CRNA  Anesthesia Plan Comments: (Follows with cardiology at [facility] for hx of Pulmonary atresia and ventricular septal defect, s/p palliative shunts, s/p repair 1999, s/p conduit revision 04/29/2005 (20 mm pulmonary conduit). S/p cardiac catheterization 05/18/2012 with right lower lobe pulmonary arterioplasty: pre-arterioplasty diameter 1.5 mm; post-arterioplasty diameter 5.4 mm. History of  chest pain without cardiac cause evident. Syncope x 3; probable orthostatic and/or vasovagal.  Last seen by cardiology 01/23/19, echo done at that time showed normal LV and RV function, full results below.   Event monitor 02/07/19 (care everywhere): Impression: Predominantly normal sinus rhythm; occasional non-complex supraventricular ectopy and non-complex ventricular ectopy, accounting for less than 1/10 of 1% of beats and less than 4/10 of 1% of beats respectively.  Pediatric congenital echo 01/25/19 (care everywhere): Summary:  1. Poor acoustic windows and lung artifact limited imaging.  2. No residual ventricular septal defect postoperatively.  3. S/p patch closure ventricular septal defect surgery.  4. Borderline dilated right ventricle.  5. Normal right ventricular systolic function.  6. Normal-size left atrium.  7. Normal-size right atrium.  8. Normal left ventricular cavity size and systolic function.  9. Right-sided aortic arch. 10. RV to PA conduit difficult to visualize; antegrade color Doppler flow in RV-PA conduit appears low velocity, non-turbulent. 11. Branch pulmonary arteries not well-visualized. 12. No pericardial effusion.)       Anesthesia Quick Evaluation

## 2019-03-07 NOTE — Progress Notes (Signed)
Anesthesia Chart Review: Follows with cardiology at [facility] for hx of Pulmonary atresia and ventricular septal defect, s/p palliative shunts, s/p repair 1999, s/p conduit revision 04/29/2005 (20 mm pulmonary conduit). S/p cardiac catheterization 05/18/2012 with right lower lobe pulmonary arterioplasty: pre-arterioplasty diameter 1.5 mm; post-arterioplasty diameter 5.4 mm. History of chest pain without cardiac cause evident. Syncope x 3; probable orthostatic and/or vasovagal.  Last seen by cardiology 01/23/19, echo done at that time showed normal LV and RV function, full results below.   Event monitor 02/07/19 (care everywhere): Impression: Predominantly normal sinus rhythm; occasional non-complex supraventricular ectopy and non-complex ventricular ectopy, accounting for less than 1/10 of 1% of beats and less than 4/10 of 1% of beats respectively.  Pediatric congenital echo 01/25/19 (care everywhere): Summary:  1. Poor acoustic windows and lung artifact limited imaging.  2. No residual ventricular septal defect postoperatively.  3. S/p patch closure ventricular septal defect surgery.  4. Borderline dilated right ventricle.  5. Normal right ventricular systolic function.  6. Normal-size left atrium.  7. Normal-size right atrium.  8. Normal left ventricular cavity size and systolic function.  9. Right-sided aortic arch. 10. RV to PA conduit difficult to visualize; antegrade color Doppler flow in RV-PA conduit appears low velocity, non-turbulent. 11. Branch pulmonary arteries not well-visualized. 12. No pericardial effusion.   Wynonia Musty [facility] Short Stay Center/Anesthesiology Phone [phone] 03/07/2019 10:28 AM

## 2019-03-07 NOTE — Telephone Encounter (Signed)
When I went to approve this dose there's two sets of instructions?

## 2019-03-08 ENCOUNTER — Ambulatory Visit (HOSPITAL_COMMUNITY): Payer: Federal, State, Local not specified - PPO | Admitting: Physician Assistant

## 2019-03-08 ENCOUNTER — Encounter (HOSPITAL_COMMUNITY): Payer: Self-pay

## 2019-03-08 ENCOUNTER — Ambulatory Visit (HOSPITAL_COMMUNITY)
Admission: RE | Admit: 2019-03-08 | Discharge: 2019-03-08 | Disposition: A | Discharge status: Home / Self Care | Payer: Federal, State, Local not specified - PPO | Attending: Gastroenterology | Admitting: Gastroenterology

## 2019-03-08 ENCOUNTER — Encounter (HOSPITAL_COMMUNITY)
Admission: RE | Disposition: A | Discharge status: Home / Self Care | Payer: Self-pay | Source: Home / Self Care | Attending: Gastroenterology

## 2019-03-08 ENCOUNTER — Other Ambulatory Visit: Payer: Self-pay

## 2019-03-08 DIAGNOSIS — F319 Bipolar disorder, unspecified: Secondary | ICD-10-CM | POA: Insufficient documentation

## 2019-03-08 DIAGNOSIS — R109 Unspecified abdominal pain: Secondary | ICD-10-CM | POA: Diagnosis not present

## 2019-03-08 DIAGNOSIS — Z8774 Personal history of (corrected) congenital malformations of heart and circulatory system: Secondary | ICD-10-CM | POA: Diagnosis not present

## 2019-03-08 DIAGNOSIS — Z79899 Other long term (current) drug therapy: Secondary | ICD-10-CM | POA: Diagnosis not present

## 2019-03-08 DIAGNOSIS — K3189 Other diseases of stomach and duodenum: Secondary | ICD-10-CM | POA: Insufficient documentation

## 2019-03-08 DIAGNOSIS — K2950 Unspecified chronic gastritis without bleeding: Secondary | ICD-10-CM | POA: Diagnosis not present

## 2019-03-08 DIAGNOSIS — R1084 Generalized abdominal pain: Secondary | ICD-10-CM | POA: Diagnosis not present

## 2019-03-08 DIAGNOSIS — F418 Other specified anxiety disorders: Secondary | ICD-10-CM | POA: Diagnosis not present

## 2019-03-08 DIAGNOSIS — R634 Abnormal weight loss: Secondary | ICD-10-CM | POA: Insufficient documentation

## 2019-03-08 DIAGNOSIS — R112 Nausea with vomiting, unspecified: Secondary | ICD-10-CM | POA: Insufficient documentation

## 2019-03-08 DIAGNOSIS — G4700 Insomnia, unspecified: Secondary | ICD-10-CM | POA: Diagnosis not present

## 2019-03-08 DIAGNOSIS — F259 Schizoaffective disorder, unspecified: Secondary | ICD-10-CM | POA: Insufficient documentation

## 2019-03-08 HISTORY — PX: ESOPHAGOGASTRODUODENOSCOPY (EGD) WITH PROPOFOL: SHX5813

## 2019-03-08 HISTORY — DX: Auditory hallucinations: R44.0

## 2019-03-08 HISTORY — PX: BIOPSY: SHX5522

## 2019-03-08 HISTORY — DX: Generalized anxiety disorder: F41.1

## 2019-03-08 HISTORY — DX: Bipolar disorder, unspecified: F31.9

## 2019-03-08 HISTORY — DX: Migraine, unspecified, not intractable, without status migrainosus: G43.909

## 2019-03-08 HISTORY — DX: Other problems related to lifestyle: Z72.89

## 2019-03-08 HISTORY — DX: Schizoaffective disorder, unspecified: F25.9

## 2019-03-08 LAB — PREGNANCY, URINE: Preg Test, Ur: NEGATIVE

## 2019-03-08 SURGERY — ESOPHAGOGASTRODUODENOSCOPY (EGD) WITH PROPOFOL
Anesthesia: Monitor Anesthesia Care

## 2019-03-08 MED ORDER — PROPOFOL 10 MG/ML IV BOLUS
INTRAVENOUS | Status: AC
  Filled 2019-03-08: qty 40

## 2019-03-08 MED ORDER — PROPOFOL 10 MG/ML IV BOLUS
INTRAVENOUS | Status: DC | PRN
  Administered 2019-03-08 (×3): 40 mg via INTRAVENOUS

## 2019-03-08 MED ORDER — SODIUM CHLORIDE 0.9 % IV SOLN: INTRAVENOUS | Status: DC

## 2019-03-08 MED ORDER — LACTATED RINGERS IV SOLN
INTRAVENOUS | Status: DC
  Administered 2019-03-08: 14:00:00 via INTRAVENOUS

## 2019-03-08 MED ORDER — LIDOCAINE 2% (20 MG/ML) 5 ML SYRINGE
INTRAMUSCULAR | Status: DC | PRN
  Administered 2019-03-08: 60 mg via INTRAVENOUS

## 2019-03-08 MED ORDER — PROPOFOL 500 MG/50ML IV EMUL
INTRAVENOUS | Status: DC | PRN
  Administered 2019-03-08: 150 ug/kg/min via INTRAVENOUS

## 2019-03-08 MED ORDER — PROPOFOL 10 MG/ML IV BOLUS
INTRAVENOUS | Status: AC
  Filled 2019-03-08: qty 20

## 2019-03-08 SURGICAL SUPPLY — 14 items

## 2019-03-08 NOTE — Op Note (Signed)
[facility] Patient Name: Alexis [name] Procedure Date: 03/08/2019 MRN: [id number] Attending MD: Jeani HawkingPatrick Parthiv Mucci , MD Date of Birth: [birth year] CSN: [id number] Age: 23 Admit Type: Outpatient Procedure:                Upper GI endoscopy Indications:              Nausea with vomiting, Weight loss Providers:                Jeani HawkingPatrick Bently Morath, MD, [place] BailiffMaggie Chrismon, RN, Matthew FolksBrooke                            Cummings, Technician, Marc MorgansShaina Gold, Technician Referring MD:              Medicines:                Propofol per Anesthesia Complications:            No immediate complications. Estimated Blood Loss:     Estimated blood loss was minimal. Procedure:                Pre-Anesthesia Assessment:                           - Prior to the procedure, a History and Physical                            was performed, and patient medications and                            allergies were reviewed. The patient's tolerance of                            previous anesthesia was also reviewed. The risks                            and benefits of the procedure and the sedation                            options and risks were discussed with the patient.                            All questions were answered, and informed consent                            was obtained. Prior Anticoagulants: The patient has                            taken no previous anticoagulant or antiplatelet                            agents. ASA Grade Assessment: II - A patient with                            mild systemic disease. After reviewing the risks  and benefits, the patient was deemed in                            satisfactory condition to undergo the procedure.                           - Sedation was administered by an anesthesia                            professional. Deep sedation was attained.                           After obtaining informed consent, the endoscope was        passed under direct vision. Throughout the                            procedure, the patient's blood pressure, pulse, and                            oxygen saturations were monitored continuously. The                            [id number]-H190 ([id number]) Olympus gastroscope was                            introduced through the mouth, and advanced to the                            second part of duodenum. The upper GI endoscopy was                            accomplished without difficulty. The patient                            tolerated the procedure well. Scope In: Scope Out: Findings:      The esophagus was normal.      A deformity was found in the gastric antrum.      Normal mucosa was found in the entire examined stomach. Biopsies were       taken with a cold forceps for Helicobacter pylori testing.      The examined duodenum was normal. Impression:               - Normal esophagus.                           - Post-surgical deformity in the gastric antrum.                           - Normal mucosa was found in the entire stomach.                            Biopsied.                           - Normal examined duodenum. Moderate Sedation:  Not Applicable - Patient had care per Anesthesia. Recommendation:           - Patient has a contact number available for                            emergencies. The signs and symptoms of potential                            delayed complications were discussed with the                            patient. Return to normal activities tomorrow.                            Written discharge instructions were provided to the                            patient.                           - Resume previous diet.                           - Continue present medications.                           - Await pathology results.                           - Return to GI clinic in 4 weeks. Procedure Code(s):        --- Professional ---                            [phone], Esophagogastroduodenoscopy, flexible,                            transoral; with biopsy, single or multiple Diagnosis Code(s):        --- Professional ---                           K91.89, Other postprocedural complications and                            disorders of digestive system                           R11.2, Nausea with vomiting, unspecified                           R63.4, Abnormal weight loss CPT copyright 2019 American Medical Association. All rights reserved. The codes documented in this report are preliminary and upon coder review may  be revised to meet current compliance requirements. Carol Ada, MD Carol Ada, MD 03/08/2019 2:12:55 PM This report has been signed electronically. Number of Addenda: 0

## 2019-03-08 NOTE — Anesthesia Postprocedure Evaluation (Signed)
Anesthesia Post Note  Patient: Alexis [name]  Procedure(s) Performed: ESOPHAGOGASTRODUODENOSCOPY (EGD) WITH PROPOFOL (N/A ) BIOPSY     Patient location during evaluation: Endoscopy Anesthesia Type: MAC Level of consciousness: awake Pain management: pain level controlled Vital Signs Assessment: post-procedure vital signs reviewed and stable Respiratory status: spontaneous breathing Cardiovascular status: stable Postop Assessment: no apparent nausea or vomiting Anesthetic complications: no    Last Vitals:  Vitals:   03/08/19 1430 03/08/19 1440  BP: 106/76 120/83  Pulse: (!) 132 76  Resp: 18 20  Temp:    SpO2: 100% 100%    Last Pain:  Vitals:   03/08/19 1415  TempSrc: Axillary  PainSc:    Pain Goal:                   Huston Foley

## 2019-03-08 NOTE — Telephone Encounter (Signed)
Left voicemail to call back and clarify her dose

## 2019-03-08 NOTE — Discharge Instructions (Signed)

## 2019-03-08 NOTE — Transfer of Care (Signed)
Immediate Anesthesia Transfer of Care Note  Patient: Alexis [name]  Procedure(s) Performed: ESOPHAGOGASTRODUODENOSCOPY (EGD) WITH PROPOFOL (N/A ) BIOPSY  Patient Location: Endoscopy Unit  Anesthesia Type:MAC  Level of Consciousness: drowsy  Airway & Oxygen Therapy: Patient Spontanous Breathing and Patient connected to face mask  Post-op Assessment: Report given to RN and Post -op Vital signs reviewed and stable  Post vital signs: Reviewed and stable  Last Vitals:  Vitals Value Taken Time  BP    Temp    Pulse    Resp 23 03/08/19 1414  SpO2    Vitals shown include unvalidated device data.  Last Pain:  Vitals:   03/08/19 1304  TempSrc: Oral  PainSc: 2          Complications: No apparent anesthesia complications

## 2019-03-08 NOTE — H&P (Signed)
  Alexis [name] HPI: The patient reports suffering with severe nausea and vomiting for the past month.  This has resulted in weight loss.  She was tested multiple times and she is not pregnant.  Past Medical History:  Diagnosis Date  . Allergy   . Auditory hallucination   . Bipolar disorder ([place])   . Deliberate self-cutting   . GAD (generalized anxiety disorder)   . Heart disease   . 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 Migraines   . Right ovarian cyst 02/20/2019   3.7 cm right ovarian cyst.  . Schizoaffective disorder ([place])   . Tetralogy of Fallot     Past Surgical History:  Procedure Laterality Date  . CARDIAC SURGERY    . CARDIAC SURGERY     4 open heart surgeries  . GASTROSTOMY W/ FEEDING TUBE     removed 1 year ago   . THORACIC DUCT LIGATION      Family History  Problem Relation Age of Onset  . Hypertension Maternal Grandmother   . Diabetes Maternal Grandfather   . Heart disease Maternal Grandfather   . Stroke Maternal Grandfather   . Hypertension Mother   . Healthy Father     Social History:  reports that she has never smoked. She has never used smokeless tobacco. She reports current alcohol use. She reports previous drug use. Drug: Marijuana.  Allergies:  Allergies  Allergen Reactions  . Dopamine     Makes WBC rise  . Peanut-Containing Drug Products     Hazel nuts [place] nuts    Medications:  Scheduled:  Continuous: . lactated ringers      Results for orders placed or performed during the hospital encounter of 03/08/19 (from the past 24 hour(s))  Pregnancy, urine     Status: None   Collection Time: 03/08/19 12:52 PM  Result Value Ref Range   Preg Test, Ur NEGATIVE NEGATIVE     No results found.  ROS:  As stated above in the HPI otherwise negative.  Blood pressure 105/68, pulse 84, temperature 98.4 F (36.9 C), temperature source Oral, resp. rate 16, SpO2 100 %.    PE: Gen: NAD, Alert and Oriented HEENT:  [place]/AT,  EOMI Neck: Supple, no LAD Lungs: CTA Bilaterally CV: RRR without M/G/R ABM: Soft, NTND, +BS Ext: No C/C/E  Assessment/Plan: 1) Nausea/vomiting. 2) Weight loss.  Plan: 1) EGD with biopsies.  Kitiara Hintze D 03/08/2019, 1:39 PM

## 2019-03-09 ENCOUNTER — Encounter (HOSPITAL_COMMUNITY): Payer: Self-pay | Admitting: Gastroenterology

## 2019-03-09 LAB — SURGICAL PATHOLOGY

## 2019-03-11 NOTE — Telephone Encounter (Signed)
See Regina's note

## 2019-03-12 NOTE — Telephone Encounter (Signed)
Left pt. A VM to return my call.

## 2019-03-13 ENCOUNTER — Ambulatory Visit (INDEPENDENT_AMBULATORY_CARE_PROVIDER_SITE_OTHER): Payer: Federal, State, Local not specified - PPO | Admitting: Psychiatry

## 2019-03-13 ENCOUNTER — Other Ambulatory Visit: Payer: Self-pay

## 2019-03-13 ENCOUNTER — Ambulatory Visit: Payer: Federal, State, Local not specified - PPO | Admitting: Physician Assistant

## 2019-03-13 ENCOUNTER — Encounter: Payer: Self-pay | Admitting: Psychiatry

## 2019-03-13 DIAGNOSIS — F411 Generalized anxiety disorder: Secondary | ICD-10-CM | POA: Diagnosis not present

## 2019-03-13 DIAGNOSIS — F251 Schizoaffective disorder, depressive type: Secondary | ICD-10-CM | POA: Diagnosis not present

## 2019-03-13 DIAGNOSIS — F4001 Agoraphobia with panic disorder: Secondary | ICD-10-CM

## 2019-03-13 MED ORDER — LITHIUM CARBONATE ER 300 MG PO TBCR: 300.0000 mg | EXTENDED_RELEASE_TABLET | Freq: Every day | ORAL | 1 refills | Status: DC

## 2019-03-13 MED ORDER — QUETIAPINE FUMARATE ER 150 MG PO TB24: ORAL_TABLET | ORAL | 0 refills | Status: DC

## 2019-03-13 NOTE — Telephone Encounter (Signed)
Patient has appt today with CC
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
## 2019-03-21 ENCOUNTER — Other Ambulatory Visit: Payer: Self-pay | Admitting: Psychiatry

## 2019-03-21 DIAGNOSIS — F251 Schizoaffective disorder, depressive type: Secondary | ICD-10-CM

## 2019-03-21 DIAGNOSIS — F4001 Agoraphobia with panic disorder: Secondary | ICD-10-CM

## 2019-03-21 DIAGNOSIS — F411 Generalized anxiety disorder: Secondary | ICD-10-CM

## 2019-03-21 NOTE — Telephone Encounter (Signed)
Apt tomorrow

## 2019-03-22 ENCOUNTER — Other Ambulatory Visit: Payer: Self-pay

## 2019-03-22 ENCOUNTER — Encounter: Payer: Self-pay | Admitting: Psychiatry

## 2019-03-22 ENCOUNTER — Ambulatory Visit (INDEPENDENT_AMBULATORY_CARE_PROVIDER_SITE_OTHER): Payer: Federal, State, Local not specified - PPO | Admitting: Psychiatry

## 2019-03-22 DIAGNOSIS — F4001 Agoraphobia with panic disorder: Secondary | ICD-10-CM | POA: Diagnosis not present

## 2019-03-22 DIAGNOSIS — F251 Schizoaffective disorder, depressive type: Secondary | ICD-10-CM | POA: Diagnosis not present

## 2019-03-22 DIAGNOSIS — F411 Generalized anxiety disorder: Secondary | ICD-10-CM

## 2019-03-22 MED ORDER — QUETIAPINE FUMARATE ER 300 MG PO TB24: 300.0000 mg | ORAL_TABLET | Freq: Every day | ORAL | 1 refills | Status: DC

## 2019-03-22 NOTE — Telephone Encounter (Signed)
Has apt this afternoon, request is for 90 day
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
## 2019-03-28 DIAGNOSIS — R1084 Generalized abdominal pain: Secondary | ICD-10-CM | POA: Diagnosis not present

## 2019-03-28 DIAGNOSIS — R112 Nausea with vomiting, unspecified: Secondary | ICD-10-CM | POA: Diagnosis not present

## 2019-03-28 DIAGNOSIS — R634 Abnormal weight loss: Secondary | ICD-10-CM | POA: Diagnosis not present

## 2019-04-04 ENCOUNTER — Ambulatory Visit: Payer: Federal, State, Local not specified - PPO | Admitting: Psychiatry

## 2019-04-05 ENCOUNTER — Other Ambulatory Visit: Payer: Self-pay | Admitting: Psychiatry

## 2019-04-05 DIAGNOSIS — F251 Schizoaffective disorder, depressive type: Secondary | ICD-10-CM

## 2019-04-23 ENCOUNTER — Other Ambulatory Visit: Payer: Self-pay | Admitting: Psychiatry

## 2019-04-23 DIAGNOSIS — F4001 Agoraphobia with panic disorder: Secondary | ICD-10-CM

## 2019-04-23 DIAGNOSIS — F251 Schizoaffective disorder, depressive type: Secondary | ICD-10-CM

## 2019-04-23 DIAGNOSIS — F411 Generalized anxiety disorder: Secondary | ICD-10-CM

## 2019-05-06 ENCOUNTER — Other Ambulatory Visit: Payer: Self-pay | Admitting: Physician Assistant

## 2019-05-08 DIAGNOSIS — Z20828 Contact with and (suspected) exposure to other viral communicable diseases: Secondary | ICD-10-CM | POA: Diagnosis not present

## 2019-05-08 DIAGNOSIS — Z7189 Other specified counseling: Secondary | ICD-10-CM | POA: Diagnosis not present

## 2019-05-21 DIAGNOSIS — Q255 Atresia of pulmonary artery: Secondary | ICD-10-CM | POA: Diagnosis not present

## 2019-05-21 DIAGNOSIS — Q210 Ventricular septal defect: Secondary | ICD-10-CM | POA: Diagnosis not present

## 2019-06-19 DIAGNOSIS — Z03818 Encounter for observation for suspected exposure to other biological agents ruled out: Secondary | ICD-10-CM | POA: Diagnosis not present

## 2019-06-22 DIAGNOSIS — Z03818 Encounter for observation for suspected exposure to other biological agents ruled out: Secondary | ICD-10-CM | POA: Diagnosis not present

## 2019-06-26 ENCOUNTER — Telehealth: Payer: Self-pay | Admitting: Physician Assistant

## 2019-06-26 NOTE — Telephone Encounter (Signed)
Left message with her Dad to call back about her medications

## 2019-06-26 NOTE — Telephone Encounter (Signed)
Please return her call.  I suggest we increase the Seroquel XR to 400 mg nightly.  I will need to send it in if she agrees.  Also make sure that she is taking the lithium.  Please put her on the cancellation list.  Thanks

## 2019-06-26 NOTE — Telephone Encounter (Signed)
Patient called and said that all the medications are not working and she is scared because she is starting to go into a depressive episode  . She is trying to not go that route she has a job and doesn't want to lose it. She has an appointment on 2/26 but would like to talk to nurse or teresa before the appointment. Will teresa work her in. Please give her a call at [phone]

## 2019-06-27 ENCOUNTER — Other Ambulatory Visit: Payer: Self-pay

## 2019-06-27 MED ORDER — QUETIAPINE FUMARATE ER 400 MG PO TB24: 400.0000 mg | ORAL_TABLET | Freq: Every day | ORAL | 0 refills | Status: DC

## 2019-06-27 NOTE — Telephone Encounter (Signed)
Left patient a message on her personal voicemail to call back for medication change. Clarification # [id number]

## 2019-06-27 NOTE — Telephone Encounter (Signed)
Pt returned call. Gave information from message. Pt agrees to take Seroquel XR 400 mg and stated she is taking her lithium. Please sent Seroquel to [facility]  [address]. Will add to canc list

## 2019-06-27 NOTE — Telephone Encounter (Signed)
Noted thank you, will submit Rx for Seroquel XR 400 mg.

## 2019-07-09 ENCOUNTER — Other Ambulatory Visit: Payer: Self-pay | Admitting: Psychiatry

## 2019-07-09 DIAGNOSIS — F251 Schizoaffective disorder, depressive type: Secondary | ICD-10-CM

## 2019-07-13 ENCOUNTER — Ambulatory Visit (INDEPENDENT_AMBULATORY_CARE_PROVIDER_SITE_OTHER): Payer: Federal, State, Local not specified - PPO | Admitting: Physician Assistant

## 2019-07-13 ENCOUNTER — Other Ambulatory Visit: Payer: Self-pay

## 2019-07-13 ENCOUNTER — Encounter: Payer: Self-pay | Admitting: Physician Assistant

## 2019-07-13 DIAGNOSIS — F251 Schizoaffective disorder, depressive type: Secondary | ICD-10-CM

## 2019-07-13 DIAGNOSIS — F4320 Adjustment disorder, unspecified: Secondary | ICD-10-CM

## 2019-07-13 DIAGNOSIS — R441 Visual hallucinations: Secondary | ICD-10-CM

## 2019-07-13 DIAGNOSIS — F411 Generalized anxiety disorder: Secondary | ICD-10-CM

## 2019-07-13 MED ORDER — QUETIAPINE FUMARATE ER 300 MG PO TB24: 600.0000 mg | ORAL_TABLET | Freq: Every day | ORAL | 1 refills | Status: DC
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
## 2019-07-25 ENCOUNTER — Other Ambulatory Visit: Payer: Self-pay

## 2019-07-25 ENCOUNTER — Emergency Department (HOSPITAL_COMMUNITY)
Admission: EM | Admit: 2019-07-25 | Discharge: 2019-07-25 | Discharge status: Against Medical Advice | Payer: Federal, State, Local not specified - PPO

## 2019-07-25 ENCOUNTER — Ambulatory Visit (HOSPITAL_COMMUNITY)
Admission: EM | Admit: 2019-07-25 | Discharge: 2019-07-25 | Disposition: A | Discharge status: Home / Self Care | Payer: Federal, State, Local not specified - PPO | Attending: Internal Medicine | Admitting: Internal Medicine

## 2019-07-25 NOTE — ED Notes (Signed)
Upon arriving to tx room. Pt recanted her former statement regarding dog's vaccination status. When pt first arrived for registration she informed multiple staff members that she had an email confirming that the dog was indeed up to date on his rabies and other vaccinations.  Upon entering tx room, pt changed her statement and said she was not sure that the dog was up to date on rabies. Provider, Moshe Cipro was notified of information and pt was directed to ER. For proper rabies eval and tx.

## 2019-07-26 ENCOUNTER — Encounter (HOSPITAL_COMMUNITY): Payer: Self-pay | Admitting: Emergency Medicine

## 2019-07-26 ENCOUNTER — Ambulatory Visit (HOSPITAL_COMMUNITY)
Admission: EM | Admit: 2019-07-26 | Discharge: 2019-07-26 | Disposition: A | Discharge status: Home / Self Care | Payer: Worker's Compensation | Attending: Family Medicine | Admitting: Family Medicine

## 2019-07-26 ENCOUNTER — Other Ambulatory Visit: Payer: Self-pay

## 2019-07-26 DIAGNOSIS — L03114 Cellulitis of left upper limb: Secondary | ICD-10-CM

## 2019-07-26 DIAGNOSIS — W540XXA Bitten by dog, initial encounter: Secondary | ICD-10-CM

## 2019-07-26 DIAGNOSIS — L0390 Cellulitis, unspecified: Secondary | ICD-10-CM

## 2019-07-26 MED ORDER — AMOXICILLIN-POT CLAVULANATE 875-125 MG PO TABS: 1.0000 | ORAL_TABLET | Freq: Two times a day (BID) | ORAL | 0 refills | Status: AC

## 2019-07-26 MED ORDER — EPINEPHRINE 0.3 MG/0.3ML IJ SOAJ: 0.3000 mg | INTRAMUSCULAR | 1 refills | Status: DC | PRN

## 2019-07-26 NOTE — Discharge Instructions (Signed)
I have sent in Augmentin to your pharmacy. Take one tablet in the morning and one in the evening. Take all of the medication as prescribed. No rabies vaccine needed.   If you notice streaking up your arm, go to the ER for IV antibiotic.

## 2019-07-26 NOTE — ED Triage Notes (Signed)
Pt c/o dog bite to left hand yesterday. Skin was punctured. She is up to date on her tetanus shot and the dog is up to date on its rabies vaccine.

## 2019-07-26 NOTE — ED Provider Notes (Signed)
[facility]    CSN: [id number] Arrival date & time: 07/26/19  1853      History   Chief Complaint Chief Complaint  Patient presents with  . Animal Bite    HPI Alexis [name] is a 24 y.o. female.   Patient reports dog bite to left hand.  States that this occurred yesterday.  Skin was punctured and she is having swelling and tenderness to the area.  Patient brought a dog vaccine records with her today, the dog has been vaccinated against rabies.  She has not made any attempts to treat at home.  Denies fever, cough, headache, nausea, chills, body aches, rash, other symptoms.  ROS per HPI  The history is provided by the patient.    Past Medical History:  Diagnosis Date  . Allergy   . Auditory hallucination   . Bipolar disorder ([place])   . Deliberate self-cutting   . GAD (generalized anxiety disorder)   . Heart disease   . 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 Migraines   . Right ovarian cyst 02/20/2019   3.7 cm right ovarian cyst.  . Schizoaffective disorder ([place])   . 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 GAD (generalized anxiety disorder) 03/13/2018  . MDD (major depressive disorder) 03/13/2018  . Chronic migraine without aura 08/31/2012  . Insomnia, unspecified 08/31/2012  . 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 Adjustment disorder 07/26/2011  . Abdominal pain 07/26/2011    Past Surgical History:  Procedure Laterality Date  . BIOPSY  03/08/2019   Procedure: BIOPSY;  Surgeon: Carol Ada, MD;  Location: [facility];  Service: Endoscopy;;  . CARDIAC SURGERY    . CARDIAC SURGERY     4 open heart surgeries  . ESOPHAGOGASTRODUODENOSCOPY (EGD) WITH PROPOFOL N/A 03/08/2019   Procedure: ESOPHAGOGASTRODUODENOSCOPY (EGD) WITH PROPOFOL;  Surgeon: Carol Ada, MD;  Location: [facility];  Service: Endoscopy;  Laterality: N/A;  . GASTROSTOMY W/ FEEDING TUBE     removed 1 year ago   . THORACIC  DUCT LIGATION      OB History   No obstetric history on file.      Home Medications    Prior to Admission medications   Medication Sig Start Date End Date Taking? Authorizing Provider  lithium carbonate (LITHOBID) 300 MG CR tablet TAKE 1 TABLET BY MOUTH EVERYDAY AT BEDTIME 07/09/19  Yes Hurst, Teresa T, PA-C  QUEtiapine (SEROQUEL XR) 300 MG 24 hr tablet Take 2 tablets (600 mg total) by mouth at bedtime. 07/13/19  Yes Donnal Moat T, PA-C  amoxicillin-clavulanate (AUGMENTIN) 875-125 MG tablet Take 1 tablet by mouth 2 (two) times daily for 10 days. 07/26/19 08/05/19  Faustino Congress, NP  EPINEPHrine 0.3 mg/0.3 mL IJ SOAJ injection Inject 0.3 mLs (0.3 mg total) into the muscle as needed for anaphylaxis. 07/26/19   Faustino Congress, NP    Family History Family History  Problem Relation Age of Onset  . Hypertension Maternal Grandmother   . Diabetes Maternal Grandfather   . Heart disease Maternal Grandfather   . Stroke Maternal Grandfather   . Hypertension Mother   . Healthy Father     Social History Social History   Tobacco Use  . Smoking status: Never Smoker  . Smokeless tobacco: Never Used  Substance Use Topics  . Alcohol use: Yes    Alcohol/week: 0.0 - 1.0 standard drinks    Comment: occasional  .  Drug use: Yes    Types: Marijuana     Allergies   Dopamine and Peanut-containing drug products   Review of Systems Review of Systems   Physical Exam Triage Vital Signs ED Triage Vitals  Enc Vitals Group     BP      Pulse      Resp      Temp      Temp src      SpO2      Weight      Height      Head Circumference      Peak Flow      Pain Score      Pain Loc      Pain Edu?      Excl. in [place]?    No data found.  Updated Vital Signs BP 123/82 (BP Location: Left Arm)   Pulse 98   Temp 98.3 F (36.8 C) (Oral)   Resp 16   LMP 07/19/2019   SpO2 100%   Visual Acuity Right Eye Distance:   Left Eye Distance:   Bilateral Distance:    Right Eye Near:    Left Eye Near:    Bilateral Near:     Physical Exam Vitals and nursing note reviewed.  Constitutional:      General: She is not in acute distress.    Appearance: Normal appearance. She is well-developed and normal weight. She is not ill-appearing, toxic-appearing or diaphoretic.  HENT:     Head: Normocephalic and atraumatic.  Eyes:     Conjunctiva/sclera: Conjunctivae normal.  Cardiovascular:     Rate and Rhythm: Normal rate and regular rhythm.     Heart sounds: Normal heart sounds. No murmur.  Pulmonary:     Effort: Pulmonary effort is normal. No respiratory distress.     Breath sounds: Normal breath sounds. No stridor. No wheezing, rhonchi or rales.  Chest:     Chest wall: No tenderness.  Abdominal:     Palpations: Abdomen is soft.     Tenderness: There is no abdominal tenderness.  Musculoskeletal:        General: Normal range of motion.     Cervical back: Normal range of motion and neck supple.  Skin:    General: Skin is warm and dry.     Capillary Refill: Capillary refill takes less than 2 seconds.     Findings: Wound present.          Comments: Areas punctured by dog bite.  Tenderness and swelling as well.  Neurological:     General: No focal deficit present.     Mental Status: She is alert and oriented to person, place, and time.  Psychiatric:        Mood and Affect: Mood normal.        Behavior: Behavior normal.      UC Treatments / Results  Labs (all labs ordered are listed, but only abnormal results are displayed) Labs Reviewed - No data to display  EKG   Radiology No results found.  Procedures Procedures (including critical care time)  Medications Ordered in UC Medications - No data to display  Initial Impression / Assessment and Plan / UC Course  I have reviewed the triage vital signs and the nursing notes.  Pertinent labs & imaging results that were available during my care of the patient were reviewed by me and considered in my medical  decision making (see chart for details).     Presents today for day-old  dog bite to left palm just below the thumb.  Area is erythematous, tender, warm to touch.  No streaking noted.  No warmth or tenderness going up the forearm either.  Augmentin 875 twice daily x10 days.  EpiPen also sent to patient's pharmacy, states that she does not have one. Take all medication, even if your wound is looking and feeling better.  Follow-up with your primary care with this office as needed.  Go to the emergency room for shortness of breath, high fever, if you notice streaking up your forearm, for further evaluation and treatment. Final Clinical Impressions(s) / UC Diagnoses   Final diagnoses:  Dog bite, initial encounter  Cellulitis, unspecified cellulitis site     Discharge Instructions     I have sent in Augmentin to your pharmacy. Take one tablet in the morning and one in the evening. Take all of the medication as prescribed. No rabies vaccine needed.   If you notice streaking up your arm, go to the ER for IV antibiotic.     ED Prescriptions    Medication Sig Dispense Auth. Provider   amoxicillin-clavulanate (AUGMENTIN) 875-125 MG tablet Take 1 tablet by mouth 2 (two) times daily for 10 days. 20 tablet Moshe Cipro, NP   EPINEPHrine 0.3 mg/0.3 mL IJ SOAJ injection Inject 0.3 mLs (0.3 mg total) into the muscle as needed for anaphylaxis. 1 each Moshe Cipro, NP     PDMP not reviewed this encounter.   Moshe Cipro, NP 07/27/19 1108

## 2019-08-10 ENCOUNTER — Ambulatory Visit: Payer: Federal, State, Local not specified - PPO | Admitting: Physician Assistant

## 2019-08-10 ENCOUNTER — Encounter: Payer: Self-pay | Admitting: Physician Assistant

## 2019-08-10 ENCOUNTER — Other Ambulatory Visit: Payer: Self-pay

## 2019-08-10 DIAGNOSIS — F411 Generalized anxiety disorder: Secondary | ICD-10-CM

## 2019-08-10 DIAGNOSIS — R441 Visual hallucinations: Secondary | ICD-10-CM | POA: Diagnosis not present

## 2019-08-10 DIAGNOSIS — F251 Schizoaffective disorder, depressive type: Secondary | ICD-10-CM

## 2019-08-10 DIAGNOSIS — G43919 Migraine, unspecified, intractable, without status migrainosus: Secondary | ICD-10-CM | POA: Diagnosis not present

## 2019-08-10 MED ORDER — QUETIAPINE FUMARATE ER 400 MG PO TB24: 800.0000 mg | ORAL_TABLET | Freq: Every day | ORAL | 1 refills | Status: DC

## 2019-08-10 MED ORDER — GABAPENTIN 100 MG PO CAPS: 100.0000 mg | ORAL_CAPSULE | Freq: Every evening | ORAL | 1 refills | Status: DC | PRN

## 2019-08-10 NOTE — Progress Notes (Signed)
[facility]  Patient ID: Alexis [name],  MRN: [id number]  PCP: Maryellen Pile, MD  Date of Evaluation: 08/10/2019 Time spent:30 minutes  Chief Complaint:  Chief Complaint    Anxiety; Depression; Hallucinations      HISTORY/CURRENT STATUS: HPI For routine med check.  "I'm laughing and joking but deep down, I'm crying inside."  Yesterday, she cried, work was stressful, had a PA there.  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   [place] voices last night. She's seeing her deceased cousin's face everywhere, even on a dog's body.  The hallucinations began again in the past couple of months and happen every few days.  H/A when wakes up, meds don't help.  Headaches became more frequent in the past few months.  She even had to leave work 2 days in a row for h/a a few weeks ago.  +vomiting.  He took Depakote a long time ago for the migraines.  The headaches are usually frontal.  She is able to enjoy things when she has a chance but because work is so stressful and tiring, she usually does not feel like doing much.  Energy and motivation can be low.  She still has an occasional passive suicidal ideation but has been occurring a lot less frequency.  She does not have a plan.  No recent increased energy with decreased need for sleep, no increased libido, increased spending, increased irritability unless triggered, risky or impulsive behaviors.  Anxiety is still an issue but usually only when triggered.  And that is mostly at work.  She will have panic attacks depending on how severe a situation has triggered her.  They are unpredictable, there is no pattern, and they can last anywhere from a few minutes to maybe an hour.  It is hard to get through the panic attacks and when she does she feels really drained.  Denies dizziness, syncope, seizures, numbness, tingling, tremor, tics, unsteady gait, slurred speech, confusion. Denies muscle or joint pain, stiffness, or  dystonia.  Individual Medical History/ Review of Systems: Changes? :No    Past medications for mental health diagnoses include: Depakote, Abilify, Zyprexa 25, Paxil CR, Prozac, Wellbutrin, Lexapro, Zoloft, Lamictal, Vraylar, Xanax, Risperdal caused galactorrhea, [name] was too expensive,  Seroquel XR 400 for 9 mos stopped DT lost response Geodon 60 BID Lithium 1200 briefly  Allergies: Dopamine and Peanut-containing drug products  Current Medications:  Current Outpatient Medications:  .  EPINEPHrine 0.3 mg/0.3 mL IJ SOAJ injection, Inject 0.3 mLs (0.3 mg total) into the muscle as needed for anaphylaxis., Disp: 1 each, Rfl: 1 .  lithium carbonate (LITHOBID) 300 MG CR tablet, TAKE 1 TABLET BY MOUTH EVERYDAY AT BEDTIME, Disp: 90 tablet, Rfl: 0 .  gabapentin (NEURONTIN) 100 MG capsule, Take 1-3 capsules (100-300 mg total) by mouth at bedtime as needed., Disp: 60 capsule, Rfl: 1 .  QUEtiapine (SEROQUEL XR) 400 MG 24 hr tablet, Take 2 tablets (800 mg total) by mouth at bedtime., Disp: 60 tablet, Rfl: 1 Medication Side Effects: none  Family Medical/ Social History: Changes? No  MENTAL HEALTH EXAM:  Last menstrual period 07/19/2019.There is no height or weight on file to calculate BMI.  General Appearance: Casual, Neat and Well Groomed  Eye Contact:  Good  Speech:  Clear and Coherent  Volume:  Normal  Mood:  Euthymic  Affect:  Appropriate  Thought Process:  Goal Directed  Orientation:  Full (Time, Place, and Person)  Thought Content: Logical   Suicidal  Thoughts:  No  Homicidal Thoughts:  No  Memory:  WNL  Judgement:  Good  Insight:  Good  Psychomotor Activity:  Normal  Concentration:  Concentration: Good  Recall:  Good  Fund of Knowledge: Good  Language: Good  Assets:  Desire for Improvement  ADL's:  Intact  Cognition: WNL  Prognosis:  Good    DIAGNOSES:    ICD-10-CM   1. Schizoaffective disorder, depressive type ([place])  F25.1   2. Generalized anxiety disorder  F41.1   3.  Hallucinations, visual  R44.1   4. Intractable migraine without status migrainosus, unspecified migraine type  G43.919     Receiving Psychotherapy: Yes  Rosary Lively, [facility]   RECOMMENDATIONS:  PDMP was reviewed. I spent 30 minutes with her.  We discussed options for the anxiety.  Since it is occurring more often lately than it had been, I think it would be beneficial to add something in to help prevent it.  We discussed gabapentin, something she is never taken.  It cannot only help with anxiety but can help with sleep, and possibly help prevent the migraines. Benefits, risks, SE discussed and she accepts. For the hallucinations, I recommend increasing the Seroquel.  She agrees. Verbal contract for safety in place. If SI continue or worsen, and/or she has a plan, go to [facility], call our office, the suicide hotline, or 911.  The Nicoletta Dress has helped decrease the intensity and frequency of the SI, so will cont for that reason.  Start Gabapentin 100 mg, may take 1-3 po qhs, depending on anxiety or if hasn't been sleeping well. Increase Seroquel XR 400 mg, 2 po qhs. Continue Lithium CR 300 mg qhs. Continue therapy with Rosary Lively, [facility]. Return in 4 weeks.   Donnal Moat, PA-C

## 2019-08-28 DIAGNOSIS — R1032 Left lower quadrant pain: Secondary | ICD-10-CM | POA: Diagnosis not present

## 2019-08-28 DIAGNOSIS — N920 Excessive and frequent menstruation with regular cycle: Secondary | ICD-10-CM | POA: Diagnosis not present

## 2019-09-01 ENCOUNTER — Other Ambulatory Visit: Payer: Self-pay | Admitting: Physician Assistant

## 2019-09-06 DIAGNOSIS — Z20822 Contact with and (suspected) exposure to covid-19: Secondary | ICD-10-CM | POA: Diagnosis not present

## 2019-09-06 DIAGNOSIS — Q213 Tetralogy of Fallot: Secondary | ICD-10-CM | POA: Diagnosis not present

## 2019-09-06 DIAGNOSIS — J209 Acute bronchitis, unspecified: Secondary | ICD-10-CM | POA: Diagnosis not present

## 2019-09-13 DIAGNOSIS — R1032 Left lower quadrant pain: Secondary | ICD-10-CM | POA: Diagnosis not present

## 2019-09-13 DIAGNOSIS — N920 Excessive and frequent menstruation with regular cycle: Secondary | ICD-10-CM | POA: Diagnosis not present

## 2019-09-25 ENCOUNTER — Ambulatory Visit (HOSPITAL_COMMUNITY)
Admission: EM | Admit: 2019-09-25 | Discharge: 2019-09-25 | Disposition: A | Discharge status: Home / Self Care | Payer: Federal, State, Local not specified - PPO | Attending: Family Medicine | Admitting: Family Medicine

## 2019-09-25 ENCOUNTER — Other Ambulatory Visit: Payer: Self-pay

## 2019-09-25 ENCOUNTER — Encounter (HOSPITAL_COMMUNITY): Payer: Self-pay

## 2019-09-25 DIAGNOSIS — Z20822 Contact with and (suspected) exposure to covid-19: Secondary | ICD-10-CM | POA: Diagnosis not present

## 2019-09-25 DIAGNOSIS — F319 Bipolar disorder, unspecified: Secondary | ICD-10-CM | POA: Diagnosis not present

## 2019-09-25 DIAGNOSIS — F259 Schizoaffective disorder, unspecified: Secondary | ICD-10-CM | POA: Insufficient documentation

## 2019-09-25 DIAGNOSIS — R42 Dizziness and giddiness: Secondary | ICD-10-CM | POA: Diagnosis not present

## 2019-09-25 DIAGNOSIS — R112 Nausea with vomiting, unspecified: Secondary | ICD-10-CM | POA: Diagnosis not present

## 2019-09-25 DIAGNOSIS — Z03818 Encounter for observation for suspected exposure to other biological agents ruled out: Secondary | ICD-10-CM

## 2019-09-25 DIAGNOSIS — Z79899 Other long term (current) drug therapy: Secondary | ICD-10-CM | POA: Diagnosis not present

## 2019-09-25 LAB — SARS CORONAVIRUS 2 (TAT 6-24 HRS): SARS Coronavirus 2: NEGATIVE

## 2019-09-25 MED ORDER — ONDANSETRON 4 MG PO TBDP: 4.0000 mg | ORAL_TABLET | Freq: Three times a day (TID) | ORAL | 0 refills | Status: DC | PRN

## 2019-09-25 NOTE — ED Triage Notes (Signed)
Pt c/o n/v, fatigue, lightheadedness, lack of appetite since last Friday. Pt states she received her 2nd COVID vaccine last Thursday. Pt reports last episode of emesis was last night, denies nausea at present.  Pt states her employer requested she be seen by medical team.

## 2019-09-25 NOTE — Discharge Instructions (Signed)
COVID test pending, monitor mychart for results Zofran as needed for nausea Drink plenty of fluids Continue medicines as prescribed  Follow up if not improving or worsening

## 2019-09-26 NOTE — ED Provider Notes (Signed)
[facility]    CSN: [id number] Arrival date & time: 09/25/19  4193      History   Chief Complaint Chief Complaint  Patient presents with  . Emesis    HPI Alexis [name] is a 24 y.o. female history of bipolar disorder presenting today for evaluation of nausea and vomiting.  Patient notes that on Friday she began to feel lightheaded and had some nausea and vomiting.  Symptoms persisted over the next 2 days, but of slightly improved today.  She denies associated abdominal pain or diarrhea.  She had her first dose of vaccine on Thursday the day before symptoms began.  She denies any fevers chills or body aches.  She denies any URI symptoms.  Denies blood in vomit.  She also reports that she had been off of her bipolar medicines briefly which she has started to take again.  She reports similar reaction in the past from withdrawal from these medicines.  Denies close sick contacts.  HPI  Past Medical History:  Diagnosis Date  . Allergy   . Auditory hallucination   . Bipolar disorder ([facility])   . Deliberate self-cutting   . GAD (generalized anxiety disorder)   . Heart disease   . 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 Migraines   . Right ovarian cyst 02/20/2019   3.7 cm right ovarian cyst.  . Schizoaffective disorder ([facility])   . 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 GAD (generalized anxiety disorder) 03/13/2018  . MDD (major depressive disorder) 03/13/2018  . Chronic migraine without aura 08/31/2012  . Insomnia, unspecified 08/31/2012  . 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 Adjustment disorder 07/26/2011  . Abdominal pain 07/26/2011    Past Surgical History:  Procedure Laterality Date  . BIOPSY  03/08/2019   Procedure: BIOPSY;  Surgeon: Jeani Hawking, MD;  Location: [facility];  Service: Endoscopy;;  . CARDIAC SURGERY    . CARDIAC SURGERY     4 open heart surgeries  . ESOPHAGOGASTRODUODENOSCOPY (EGD) WITH  PROPOFOL N/A 03/08/2019   Procedure: ESOPHAGOGASTRODUODENOSCOPY (EGD) WITH PROPOFOL;  Surgeon: Jeani Hawking, MD;  Location: [facility];  Service: Endoscopy;  Laterality: N/A;  . GASTROSTOMY W/ FEEDING TUBE     removed 1 year ago   . THORACIC DUCT LIGATION      OB History   No obstetric history on file.      Home Medications    Prior to Admission medications   Medication Sig Start Date End Date Taking? Authorizing Provider  gabapentin (NEURONTIN) 100 MG capsule Take 1-3 capsules (100-300 mg total) by mouth at bedtime as needed. 08/10/19  Yes Melony Overly T, PA-C  lithium carbonate (LITHOBID) 300 MG CR tablet TAKE 1 TABLET BY MOUTH EVERYDAY AT BEDTIME 07/09/19  Yes Hurst, Teresa T, PA-C  QUEtiapine (SEROQUEL XR) 400 MG 24 hr tablet TAKE 2 TABLETS (800 MG TOTAL) BY MOUTH AT BEDTIME. 09/03/19  Yes Hurst, Teresa T, PA-C  EPINEPHrine 0.3 mg/0.3 mL IJ SOAJ injection Inject 0.3 mLs (0.3 mg total) into the muscle as needed for anaphylaxis. 07/26/19   Moshe Cipro, NP  ondansetron (ZOFRAN ODT) 4 MG disintegrating tablet Take 1 tablet (4 mg total) by mouth every 8 (eight) hours as needed for nausea or vomiting. 09/25/19   Antonella Upson, Junius Creamer, PA-C    Family History Family History  Problem Relation Age of Onset  . Hypertension Maternal Grandmother   .  Diabetes Maternal Grandfather   . Heart disease Maternal Grandfather   . Stroke Maternal Grandfather   . Hypertension Mother   . Healthy Father     Social History Social History   Tobacco Use  . Smoking status: Never Smoker  . Smokeless tobacco: Never Used  Substance Use Topics  . Alcohol use: Yes    Alcohol/week: 0.0 - 1.0 standard drinks    Comment: occasional  . Drug use: Yes    Types: Marijuana    Comment: Occas.  Hemp     Allergies   Dopamine and Peanut-containing drug products   Review of Systems Review of Systems  Constitutional: Positive for fatigue. Negative for activity change, appetite change, chills and  fever.  HENT: Negative for congestion, ear pain, rhinorrhea, sinus pressure, sore throat and trouble swallowing.   Eyes: Negative for discharge and redness.  Respiratory: Negative for cough, chest tightness and shortness of breath.   Cardiovascular: Negative for chest pain.  Gastrointestinal: Positive for nausea and vomiting. Negative for abdominal pain and diarrhea.  Musculoskeletal: Negative for myalgias.  Skin: Negative for rash.  Neurological: Positive for light-headedness. Negative for dizziness and headaches.     Physical Exam Triage Vital Signs ED Triage Vitals  Enc Vitals Group     BP 09/25/19 1002 129/75     Pulse Rate 09/25/19 1002 (!) 102     Resp 09/25/19 1002 16     Temp 09/25/19 1002 99 F (37.2 C)     Temp Source 09/25/19 1002 Oral     SpO2 09/25/19 1002 99 %     Weight --      Height --      Head Circumference --      Peak Flow --      Pain Score 09/25/19 0959 0     Pain Loc --      Pain Edu? --      Excl. in [place]? --    No data found.  Updated Vital Signs BP 129/75 (BP Location: Left Arm)   Pulse (!) 102   Temp 99 F (37.2 C) (Oral)   Resp 16   LMP 09/24/2019   SpO2 99%   Visual Acuity Right Eye Distance:   Left Eye Distance:   Bilateral Distance:    Right Eye Near:   Left Eye Near:    Bilateral Near:     Physical Exam Vitals and nursing note reviewed.  Constitutional:      Appearance: She is well-developed.     Comments: No acute distress  HENT:     Head: Normocephalic and atraumatic.     Ears:     Comments: Bilateral ears without tenderness to palpation of external auricle, tragus and mastoid, EAC's without erythema or swelling, TM's with good bony landmarks and cone of light. Non erythematous.     Nose: Nose normal.     Mouth/Throat:     Comments: Oral mucosa pink and moist, no tonsillar enlargement or exudate. Posterior pharynx patent and nonerythematous, no uvula deviation or swelling. Normal phonation.  Eyes:      Conjunctiva/sclera: Conjunctivae normal.  Cardiovascular:     Rate and Rhythm: Normal rate.  Pulmonary:     Effort: Pulmonary effort is normal. No respiratory distress.     Comments: Breathing comfortably at rest, CTABL, no wheezing, rales or other adventitious sounds auscultated  Abdominal:     General: There is no distension.     Comments: Soft, nondistended, nontender to light and deep palpation  throughout  Musculoskeletal:        General: Normal range of motion.     Cervical back: Neck supple.  Skin:    General: Skin is warm and dry.  Neurological:     Mental Status: She is alert and oriented to person, place, and time.      UC Treatments / Results  Labs (all labs ordered are listed, but only abnormal results are displayed) Labs Reviewed  SARS CORONAVIRUS 2 (TAT 6-24 HRS)    EKG   Radiology No results found.  Procedures Procedures (including critical care time)  Medications Ordered in UC Medications - No data to display  Initial Impression / Assessment and Plan / UC Course  I have reviewed the triage vital signs and the nursing notes.  Pertinent labs & imaging results that were available during my care of the patient were reviewed by me and considered in my medical decision making (see chart for details).     Covid PCR pending suspect most likely viral etiology versus side effects from being off medicines temporarily.  No abdominal tenderness, do not suspect abdominal emergency.  Recommending symptomatic and supportive care, Zofran as needed for nausea resting fluids, oral rehydration.  Monitor for gradual self resolution.  Discussed strict return precautions. Patient verbalized understanding and is agreeable with plan.  Final Clinical Impressions(s) / UC Diagnoses   Final diagnoses:  Non-intractable vomiting with nausea, unspecified vomiting type     Discharge Instructions     COVID test pending, monitor mychart for results Zofran as needed for nausea  Drink plenty of fluids Continue medicines as prescribed  Follow up if not improving or worsening   ED Prescriptions    Medication Sig Dispense Auth. Provider   ondansetron (ZOFRAN ODT) 4 MG disintegrating tablet Take 1 tablet (4 mg total) by mouth every 8 (eight) hours as needed for nausea or vomiting. 20 tablet Brittnie Lewey, [place] C, PA-C     PDMP not reviewed this encounter.   Sharyon Cable [place] C, [place] 09/26/19 [phone]

## 2019-10-01 ENCOUNTER — Encounter: Payer: Self-pay | Admitting: Physician Assistant

## 2019-10-01 ENCOUNTER — Telehealth (INDEPENDENT_AMBULATORY_CARE_PROVIDER_SITE_OTHER): Payer: Federal, State, Local not specified - PPO | Admitting: Physician Assistant

## 2019-10-01 DIAGNOSIS — F319 Bipolar disorder, unspecified: Secondary | ICD-10-CM | POA: Diagnosis not present

## 2019-10-01 DIAGNOSIS — F411 Generalized anxiety disorder: Secondary | ICD-10-CM | POA: Diagnosis not present

## 2019-10-01 DIAGNOSIS — R441 Visual hallucinations: Secondary | ICD-10-CM | POA: Diagnosis not present

## 2019-10-01 MED ORDER — CARIPRAZINE HCL 3 MG PO CAPS: 3.0000 mg | ORAL_CAPSULE | Freq: Every day | ORAL | 1 refills | Status: DC

## 2019-10-01 NOTE — Progress Notes (Signed)
[facility]  Patient ID: Alexis [name],  MRN: [id number]  PCP: Maryellen Pile, MD  Date of Evaluation: 10/01/2019 Time spent:30 minutes  Chief Complaint:  Chief Complaint    Follow-up     Virtual Visit via Telephone Note  I connected with patient by a video enabled telemedicine application or telephone, with their informed consent, and verified patient privacy and that I am speaking with the correct person using two identifiers.  I am private, in my office and the patient is at work, in private.  I discussed the limitations, risks, security and privacy concerns of performing an evaluation and management service by telephone and the availability of in person appointments. I also discussed with the patient that there may be a patient responsible charge related to this service. The patient expressed understanding and agreed to proceed.   I discussed the assessment and treatment plan with the patient. The patient was provided an opportunity to ask questions and all were answered. The patient agreed with the plan and demonstrated an understanding of the instructions.   The patient was advised to call back or seek an in-person evaluation if the symptoms worsen or if the condition fails to improve as anticipated.  I provided 30  minutes of non-face-to-face time during this encounter.  HISTORY/CURRENT STATUS: HPI Not doing well.  Pt states she is struggling.  Not sleeping well.  Wakes up at all times of the night. Having PA at least 1 per day, last about 2-5 minutes. Mood is constantly changing quickly.  Can be very angry and hostile, and then cry, within a matter of minutes.  Really depressed.  Has had the urge to cut but hasn't done it. Has been having AH. She is hearing a voice that tells her to hurt herself, and that she isn't good enough. Has had SI in the past week. "I'm not in a state where I would hurt myself.  I have no intentions to hurt myself.  It is scary to have those  thoughts."  Also reports increased spending.  No increased energy with decreased need for sleep.  She is having impulsivity about buying things.  No change in libido.    Denies dizziness, syncope, seizures, numbness, tingling, tremor, tics, unsteady gait, slurred speech, confusion. Denies muscle or joint pain, stiffness, or dystonia.  Individual Medical History/ Review of Systems: Changes? :No    Past medications for mental health diagnoses include: Depakote, Abilify, Zyprexa 25, Paxil CR, Prozac, Wellbutrin, Lexapro, Zoloft, Lamictal, Vraylar, Xanax, Risperdal caused galactorrhea, [name] was too expensive,  Seroquel XR 400 for 9 mos stopped DT lost response Geodon 60 BID Lithium 1200 briefly  Allergies: Dopamine and Peanut-containing drug products  Current Medications:  Current Outpatient Medications:  .  EPINEPHrine 0.3 mg/0.3 mL IJ SOAJ injection, Inject 0.3 mLs (0.3 mg total) into the muscle as needed for anaphylaxis., Disp: 1 each, Rfl: 1 .  gabapentin (NEURONTIN) 100 MG capsule, Take 1-3 capsules (100-300 mg total) by mouth at bedtime as needed., Disp: 60 capsule, Rfl: 1 .  lithium carbonate (LITHOBID) 300 MG CR tablet, TAKE 1 TABLET BY MOUTH EVERYDAY AT BEDTIME, Disp: 90 tablet, Rfl: 0 .  ondansetron (ZOFRAN ODT) 4 MG disintegrating tablet, Take 1 tablet (4 mg total) by mouth every 8 (eight) hours as needed for nausea or vomiting., Disp: 20 tablet, Rfl: 0 .  QUEtiapine (SEROQUEL XR) 400 MG 24 hr tablet, TAKE 2 TABLETS (800 MG TOTAL) BY MOUTH AT BEDTIME., Disp: 180 tablet, Rfl: 0 .  cariprazine (VRAYLAR) capsule, Take 1 capsule (3 mg total) by mouth daily., Disp: 30 capsule, Rfl: 1 Medication Side Effects: none  Family Medical/ Social History: Changes? No  MENTAL HEALTH EXAM:  Last menstrual period 09/24/2019.There is no height or weight on file to calculate BMI.  General Appearance: Unable to assess  Eye Contact:  Unable to assess  Speech:  Clear and Coherent and Normal Rate   Volume:  Normal  Mood:  Depressed  Affect:  Unable to assess  Thought Process:  Goal Directed and Descriptions of Associations: Intact  Orientation:  Full (Time, Place, and Person)  Thought Content: Logical   Suicidal Thoughts:  No  Homicidal Thoughts:  No  Memory:  WNL  Judgement:  Good  Insight:  Good  Psychomotor Activity:  Normal  Concentration:  Concentration: Good  Recall:  Good  Fund of Knowledge: Good  Language: Good  Assets:  Desire for Improvement  ADL's:  Intact  Cognition: WNL  Prognosis:  Good    DIAGNOSES:    ICD-10-CM   1. Generalized anxiety disorder  F41.1   2. Depressed bipolar I disorder ([place])  F31.9   3. Hallucinations, visual  R44.1     Receiving Psychotherapy: Yes   With Rosary Lively, [place]:  PDMP was reviewed. I spent 30 minutes with her. She does not feel that she needs to go to the hospital.  From what she is describing, I think it is best to treat her as an outpatient.  She is able to contract for safety and knows to call here, call 911, call the suicide hotline, or go to [place] emergency room if the suicidal thoughts should worsen. We discussed different options for treatment.  1 option would be to increase the Seroquel XR.  She is at the maximum dose but sometimes we increase to 1,000 mg if needed.  She feels that the Seroquel has stopped working a long time ago and she would like to change to something else.  I recommend Latuda, Vraylar, or [place].  [place] seems to recall that her insurance would not pay for Latuda or [place].  She has been on Vraylar at 1 time but were not sure of the outcome, why it was stopped.  She would like to retry that. She asked specifically about Lamictal.  She has read in several post on social media that a lot of people are on Lamictal and it has helped them.  I went over the risks of possible rash/Stevens-Johnson syndrome even though it is rare, I do not want to start this while really starting,  or introducing, another new medication.  If she does get a rash we will know which medication comes from.  Also Lamictal pretty much only treat the depression part of her symptoms, and she does need an antipsychotic because of the hallucinations.  She understands. Start Vraylar 3 mg, 1 p.o. daily. Continue gabapentin 100 mg, up to 3 p.o. nightly. Continue lithium CR, 300 mg nightly. Wean off Seroquel XR 400 mg, 1 p.o. nightly for 4 nights and then stop.  She will do that concomitantly with the addition of Vraylar.  She verbalizes understanding. These medication plans apply only if her insurance will pay for the [place].  If not, we will address this by phone. Continue therapy. Return in 2 weeks.  Donnal Moat, PA-C

## 2019-10-04 ENCOUNTER — Other Ambulatory Visit: Payer: Self-pay | Admitting: Physician Assistant

## 2019-10-04 DIAGNOSIS — F251 Schizoaffective disorder, depressive type: Secondary | ICD-10-CM

## 2019-10-09 DIAGNOSIS — S93492A Sprain of other ligament of left ankle, initial encounter: Secondary | ICD-10-CM | POA: Diagnosis not present

## 2019-10-10 DIAGNOSIS — S93492A Sprain of other ligament of left ankle, initial encounter: Secondary | ICD-10-CM | POA: Diagnosis not present

## 2019-10-11 ENCOUNTER — Telehealth: Payer: Self-pay | Admitting: Physician Assistant

## 2019-10-11 NOTE — Telephone Encounter (Signed)
Patient called and said that she is not sleeping at all and has insomina and she would like medication to help her sleep. Please give her a call back with suggestions at [phone]

## 2019-10-12 NOTE — Telephone Encounter (Signed)
LM with information and to call back with follow up recommendation

## 2019-10-12 NOTE — Telephone Encounter (Signed)
Please double check with her to make sure the Leafy Kindle is not too expensive.I do not see that she has tried trazodone or mirtazapine.  1 of those would be my first choice.  Or go back on the Seroquel short acting, a low dose may be 25 to 50 mg.  Let me know and I will send in the prescription.  Thanks.

## 2019-10-16 ENCOUNTER — Other Ambulatory Visit: Payer: Self-pay | Admitting: Physician Assistant

## 2019-10-16 MED ORDER — MIRTAZAPINE 7.5 MG PO TABS
7.5000 mg | ORAL_TABLET | Freq: Every evening | ORAL | 1 refills | Status: DC | PRN
Start: 2019-10-16 — End: 2019-11-08

## 2019-10-16 NOTE — Telephone Encounter (Signed)
I sent in Mirtazepine.  I'm glad she got the Vraylar.  The side effects of Vraylar does not include insomnia as a common one.  I suggest she take the Vraylar at the opposite time of day if it is keeping her awake, take it in the morning instead of evening.

## 2019-10-17 NOTE — Telephone Encounter (Signed)
Patient aware.

## 2019-11-01 ENCOUNTER — Encounter: Payer: Self-pay | Admitting: Family Medicine

## 2019-11-01 ENCOUNTER — Ambulatory Visit: Payer: Federal, State, Local not specified - PPO | Admitting: Family Medicine

## 2019-11-01 VITALS — BP 100/66 | HR 81 | Temp 97.8°F | Ht 65.25 in | Wt 143.0 lb

## 2019-11-01 DIAGNOSIS — R5383 Other fatigue: Secondary | ICD-10-CM | POA: Diagnosis not present

## 2019-11-01 DIAGNOSIS — G4452 New daily persistent headache (NDPH): Secondary | ICD-10-CM | POA: Diagnosis not present

## 2019-11-01 DIAGNOSIS — G43709 Chronic migraine without aura, not intractable, without status migrainosus: Secondary | ICD-10-CM

## 2019-11-01 DIAGNOSIS — J329 Chronic sinusitis, unspecified: Secondary | ICD-10-CM | POA: Diagnosis not present

## 2019-11-01 DIAGNOSIS — Z23 Encounter for immunization: Secondary | ICD-10-CM

## 2019-11-01 DIAGNOSIS — G4700 Insomnia, unspecified: Secondary | ICD-10-CM

## 2019-11-01 DIAGNOSIS — R110 Nausea: Secondary | ICD-10-CM

## 2019-11-01 NOTE — Progress Notes (Signed)
Subjective:    Patient ID: Alexis [name], female    DOB: 01-Aug-1995, 24 y.o.   MRN: [id number]  HPI Chief Complaint  Patient presents with  . other    establish care  sometimes food and others bile    She is new to the practice and here to establish care.  Previous medical care: Tresa Garter pediatrician.   Other providers: Cardiologist- Rosiland Oz  OB./GYN- Debbora Dus at [facility]  Psychiatry- Melony Overly  GI- Dr. Elnoria Howard   Complains of nausea and vomiting for the past year.  Wakes up with nausea every morning. Only vomiting occasionally.   States she was told by Dr. Elnoria Howard that she does not have IBD or IBS.  She had an EGD and it was negative per patient.   States she has headaches every morning. Headaches are not her usual migraines. 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  Takes ibuprofen 3 days per week.   States she saw a pediatric neurologist in the past and was diagnosed with migraine headaches.  States she is currently on gabapentin for headaches but this is not helping.   She does not sleep well. Trouble falling asleep and wakes up often. Nightmares with new medication.   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  [employer] graduate.  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  She is engaged  States she has a history of recurrent sinus infections. States last week she felt like she may have had a sinus infection but she has improved over the past few days.     LMP: Oct 05, 2019. Irregular.  Denies ever being sexually active.   Reviewed allergies, medications, past medical, surgical, family, and social history.     Review of Systems Pertinent positives and negatives in the history of present illness.     Objective:   Physical Exam Constitutional:      General: She is not in acute distress.    Appearance: Normal appearance.  She is not ill-appearing.  HENT:     Right Ear: Tympanic membrane and ear canal normal.     Left Ear: Tympanic membrane and ear canal normal.  Eyes:     Extraocular Movements: Extraocular movements intact.     Conjunctiva/sclera: Conjunctivae normal.     Pupils: Pupils are equal, round, and reactive to light.  Cardiovascular:     Rate and Rhythm: Normal rate and regular rhythm.     Heart sounds: Murmur heard.   Pulmonary:     Effort: Pulmonary effort is normal.     Breath sounds: Normal breath sounds.  Abdominal:     General: Abdomen is flat. Bowel sounds are normal. There is no distension.     Palpations: Abdomen is soft.     Tenderness: There is no abdominal tenderness. There is no guarding or rebound.  Musculoskeletal:        General: Normal range of motion.     Cervical back: Normal range of motion and neck supple.  Lymphadenopathy:     Cervical: No cervical adenopathy.  Skin:    General: Skin is warm and dry.     Capillary Refill: Capillary refill takes less than 2 seconds.  Neurological:     General: No focal deficit present.     Mental Status: She is alert and oriented to  person, place, and time.     Cranial Nerves: No cranial nerve deficit.     Sensory: No sensory deficit.     Motor: No weakness, tremor or pronator drift.     Coordination: Romberg sign negative. Coordination normal. Finger-Nose-Finger Test normal.     Gait: Gait is intact.     Deep Tendon Reflexes: Reflexes are normal and symmetric.  Psychiatric:        Attention and Perception: Attention normal.        Mood and Affect: Mood normal.        Speech: Speech normal.        Thought Content: Thought content normal.        Cognition and Memory: Cognition normal.        Judgment: Judgment normal.    BP 100/66   Pulse 81   Temp 97.8 F (36.6 C)   Ht 5' 5.25" (1.657 m)   Wt 143 lb (64.9 kg)   LMP 10/05/2019   SpO2 98%   BMI 23.61 kg/m      Assessment & Plan:  New persistent daily headache -  Plan: Ambulatory referral to Neurology -She is new to me and here today for chief complaint of new onset daily headaches upon awakening and nausea daily.  This has been going on for several months.  Negative neuro exam.  Encouraged her to make sure she is not taking an NSAID more than 2 days/week.  Encouraged hydration, good sleep hygiene, avoiding fluctuations in caffeine or alcohol and eating small frequent meals. I will refer her to neurology for further evaluation.   Fatigue, unspecified type - Plan: CBC with Differential/Platelet, Comprehensive metabolic panel, TSH, T4, free, T3, VITAMIN D 25 Hydroxy (Vit-D Deficiency, Fractures), Vitamin B12, Iron, TIBC and Ferritin Panel -Discussed possible etiologies for fatigue.  Will check labs and follow-up.  Chronic sinusitis, unspecified location -Symptoms improving.  We will hold off on antibiotics and do supportive care.  She reports going to urgent care almost monthly and receiving antibiotics for sinusitis.  Discussed referral in the future to ENT if needed.  Insomnia, unspecified type -Being managed by psychiatry.  Need for diphtheria-tetanus-pertussis (Tdap) vaccine - Plan: Tdap vaccine greater than or equal to 7yo IM -Counseling done on all components of the vaccine.  Daily nausea -She has been evaluated by GI.  I do not have these records.  Reports negative EGD.  Suspect nausea may be related to new daily headaches.  Chronic migraine without aura without status migrainosus, not intractable - Plan: Ambulatory referral to Neurology -Reports new headaches do not appear to be her usual migraine headaches.  I am referring her to neurology for further evaluation

## 2019-11-02 ENCOUNTER — Other Ambulatory Visit: Payer: Self-pay | Admitting: Family Medicine

## 2019-11-02 DIAGNOSIS — E559 Vitamin D deficiency, unspecified: Secondary | ICD-10-CM | POA: Insufficient documentation

## 2019-11-02 LAB — IRON,TIBC AND FERRITIN PANEL
Ferritin: 45 ng/mL (ref 15–150)
Iron Saturation: 22 % (ref 15–55)
Iron: 89 ug/dL (ref 27–159)
Total Iron Binding Capacity: 397 ug/dL (ref 250–450)
UIBC: 308 ug/dL (ref 131–425)

## 2019-11-02 LAB — COMPREHENSIVE METABOLIC PANEL
ALT: 13 IU/L (ref 0–32)
AST: 16 IU/L (ref 0–40)
Albumin/Globulin Ratio: 1.8 (ref 1.2–2.2)
Albumin: 4.7 g/dL (ref 3.9–5.0)
Alkaline Phosphatase: 70 IU/L (ref 48–121)
BUN/Creatinine Ratio: 7 — ABNORMAL LOW (ref 9–23)
BUN: 6 mg/dL (ref 6–20)
Bilirubin Total: 0.5 mg/dL (ref 0.0–1.2)
CO2: 19 mmol/L — ABNORMAL LOW (ref 20–29)
Calcium: 9.7 mg/dL (ref 8.7–10.2)
Chloride: 104 mmol/L (ref 96–106)
Creatinine, Ser: 0.84 mg/dL (ref 0.57–1.00)
GFR calc Af Amer: 113 mL/min/{1.73_m2} (ref >59)
GFR calc non Af Amer: 98 mL/min/{1.73_m2} (ref >59)
Globulin, Total: 2.6 g/dL (ref 1.5–4.5)
Glucose: 88 mg/dL (ref 65–99)
Potassium: 4.4 mmol/L (ref 3.5–5.2)
Sodium: 137 mmol/L (ref 134–144)
Total Protein: 7.3 g/dL (ref 6.0–8.5)

## 2019-11-02 LAB — CBC WITH DIFFERENTIAL/PLATELET
Basophils Absolute: 0.1 10*3/uL (ref 0.0–0.2)
Basos: 1 %
EOS (ABSOLUTE): 0.1 10*3/uL (ref 0.0–0.4)
Eos: 2 %
Hematocrit: 41.1 % (ref 34.0–46.6)
Hemoglobin: 14 g/dL (ref 11.1–15.9)
Immature Grans (Abs): 0 10*3/uL (ref 0.0–0.1)
Immature Granulocytes: 0 %
Lymphocytes Absolute: 0.9 10*3/uL (ref 0.7–3.1)
Lymphs: 24 %
MCH: 30.1 pg (ref 26.6–33.0)
MCHC: 34.1 g/dL (ref 31.5–35.7)
MCV: 88 fL (ref 79–97)
Monocytes Absolute: 0.4 10*3/uL (ref 0.1–0.9)
Monocytes: 10 %
Neutrophils Absolute: 2.4 10*3/uL (ref 1.4–7.0)
Neutrophils: 63 %
Platelets: 149 10*3/uL — ABNORMAL LOW (ref 150–450)
RBC: 4.65 x10E6/uL (ref 3.77–5.28)
RDW: 13.3 % (ref 11.7–15.4)
WBC: 3.8 10*3/uL (ref 3.4–10.8)

## 2019-11-02 LAB — TSH: TSH: 1.28 u[IU]/mL (ref 0.450–4.500)

## 2019-11-02 LAB — VITAMIN B12: Vitamin B-12: 695 pg/mL (ref 232–1245)

## 2019-11-02 LAB — T3: T3, Total: 150 ng/dL (ref 71–180)

## 2019-11-02 LAB — T4, FREE: Free T4: 1.38 ng/dL (ref 0.82–1.77)

## 2019-11-02 LAB — VITAMIN D 25 HYDROXY (VIT D DEFICIENCY, FRACTURES): Vit D, 25-Hydroxy: 10.7 ng/mL — ABNORMAL LOW (ref 30.0–100.0)

## 2019-11-02 MED ORDER — VITAMIN D (ERGOCALCIFEROL) 1.25 MG (50000 UNIT) PO CAPS: 50000.0000 [IU] | ORAL_CAPSULE | ORAL | 0 refills | Status: DC

## 2019-11-05 ENCOUNTER — Encounter: Payer: Self-pay | Admitting: Family Medicine

## 2019-11-08 ENCOUNTER — Other Ambulatory Visit: Payer: Self-pay

## 2019-11-08 ENCOUNTER — Ambulatory Visit (INDEPENDENT_AMBULATORY_CARE_PROVIDER_SITE_OTHER): Payer: Federal, State, Local not specified - PPO | Admitting: Physician Assistant

## 2019-11-08 ENCOUNTER — Ambulatory Visit: Payer: Federal, State, Local not specified - PPO | Admitting: Physician Assistant

## 2019-11-08 ENCOUNTER — Encounter: Payer: Self-pay | Admitting: Physician Assistant

## 2019-11-08 DIAGNOSIS — F411 Generalized anxiety disorder: Secondary | ICD-10-CM | POA: Diagnosis not present

## 2019-11-08 DIAGNOSIS — F514 Sleep terrors [night terrors]: Secondary | ICD-10-CM

## 2019-11-08 DIAGNOSIS — F251 Schizoaffective disorder, depressive type: Secondary | ICD-10-CM

## 2019-11-08 MED ORDER — CARIPRAZINE HCL 3 MG PO CAPS: 3.0000 mg | ORAL_CAPSULE | Freq: Every day | ORAL | 2 refills | Status: DC

## 2019-11-08 MED ORDER — LAMOTRIGINE 25 MG PO TABS
ORAL_TABLET | ORAL | 1 refills | Status: DC
Start: 2019-11-08 — End: 2019-12-14

## 2019-11-08 MED ORDER — HYDROXYZINE HCL 10 MG PO TABS: 10.0000 mg | ORAL_TABLET | Freq: Three times a day (TID) | ORAL | 1 refills | Status: DC | PRN

## 2019-11-09 NOTE — Progress Notes (Signed)
[facility]  Patient ID: Alexis [name],  MRN: [id number]  PCP: Girtha Rm, NP-C  Date of Evaluation: 11/08/2019 Time spent:40 minutes  Chief Complaint:  Chief Complaint    Depression; Insomnia      HISTORY/CURRENT STATUS: HPI for routine med check but not doing well.  Has been more depressed for 3 weeks.  Has had more urges to cut but she doesn't want to.  Also her scrub tops are short-sleeved and she does not want to have other people see any scars.  Has had passive  SI. "I don't want to kill myself.  I have plans.  I'm going to vet school.  So I don't want to have suicidal thoughts when I'm in vet school." Wakes up crying.  Very stressed at her job. Stays anxious.  Feels that the Geodon has not helped at all.  Lithium has not helped with depression either.  Having vivid nightmares since she started the Mirtazepine. And it's not helping her sleep.   Patient denies increased energy with decreased need for sleep, no increased talkativeness, no racing thoughts, no impulsivity or risky behaviors, no increased spending, no increased libido, no grandiosity, no increased irritability or anger, and she has had no hallucinations recently.  No paranoia.  Denies dizziness, syncope, seizures, numbness, tingling, tremor, tics, unsteady gait, slurred speech, confusion. Denies muscle or joint pain, stiffness, or dystonia.  Individual Medical History/ Review of Systems: Changes? :No    Past medications for mental health diagnoses include: Depakote, Abilify, Zyprexa 25, Paxil CR, Prozac, Wellbutrin, Lexapro, Zoloft, Lamictal, Vraylar, Xanax, Risperdal caused galactorrhea, [name] was too expensive, Gabapentin wasn't helpful, Mirtazepine caused nightmares, Seroquel XR 400 for 9 mos stopped DT lost response, Geodon 60 BID, Lithium 1200 briefly  Allergies: Dopamine and Peanut-containing drug products  Current Medications:  Current Outpatient Medications:  .  cariprazine (VRAYLAR)  capsule, Take 1 capsule (3 mg total) by mouth daily., Disp: 30 capsule, Rfl: 2 .  EPINEPHrine 0.3 mg/0.3 mL IJ SOAJ injection, Inject 0.3 mLs (0.3 mg total) into the muscle as needed for anaphylaxis., Disp: 1 each, Rfl: 1 .  Norethin-Eth Estrad-Fe Biphas (LO LOESTRIN FE PO), Lo Loestrin Fe 1 mg-10 mcg (24)/10 mcg (2) tablet, Disp: , Rfl:  .  ondansetron (ZOFRAN ODT) 4 MG disintegrating tablet, Take 1 tablet (4 mg total) by mouth every 8 (eight) hours as needed for nausea or vomiting., Disp: 20 tablet, Rfl: 0 .  Vitamin D, Ergocalciferol, (DRISDOL) 1.25 MG (50000 UNIT) CAPS capsule, Take 1 capsule (50,000 Units total) by mouth every 7 (seven) days., Disp: 12 capsule, Rfl: 0 .  hydrOXYzine (ATARAX/VISTARIL) 10 MG tablet, Take 1-2 tablets (10-20 mg total) by mouth every 8 (eight) hours as needed., Disp: 60 tablet, Rfl: 1 .  lamoTRIgine (LAMICTAL) 25 MG tablet, 1 po qhs for 2 weeks, then 2 po qhs., Disp: 60 tablet, Rfl: 1 .  Oxymetazoline HCl (NASAL SPRAY) 0.05 % SOLN, Nasal Spray (oxymetazoline) 0.05 %  SPRAY 2 SPRAY INTO BOTH NOSTRILS TWICE A DAY EVERY 10-12H AS NEEDED FOR NASAL CONGESTION (Patient not taking: Reported on 11/08/2019), Disp: , Rfl:  Medication Side Effects: Night terrors from the mirtazapine  Family Medical/ Social History: Changes? Yes she is taking a chemistry class this summer.  MENTAL HEALTH EXAM:  There were no vitals taken for this visit.There is no height or weight on file to calculate BMI.  General Appearance: Casual, Neat and Well Groomed  Eye Contact:  Good  Speech:  Clear and Coherent  and Normal Rate  Volume:  Normal  Mood:  Euthymic  Affect:  Appropriate  Thought Process:  Goal Directed and Descriptions of Associations: Intact  Orientation:  Full (Time, Place, and Person)  Thought Content: Logical   Suicidal Thoughts:  No  Homicidal Thoughts:  No  Memory:  WNL  Judgement:  Good  Insight:  Good  Psychomotor Activity:  Normal  Concentration:  Concentration: Good   Recall:  Good  Fund of Knowledge: Good  Language: Good  Assets:  Desire for Improvement  ADL's:  Intact  Cognition: WNL  Prognosis:  Good    DIAGNOSES:    ICD-10-CM   1. Schizoaffective disorder, depressive type (HCC)  F25.1   2. Generalized anxiety disorder  F41.1   3. Night terrors  F51.4     Receiving Psychotherapy: Yes  With [name], [facility] C.   RECOMMENDATIONS:  PDMP was reviewed. I provided 40 minutes of face-to-face time during this encounter. Contract for safety is in place.  Call the office or go to the emergency room if suicidal thoughts worsen in any way. I do believe the Leafy Kindle has helped with the hallucinations.  I do not want to change that at this time. We discussed the Lamictal again.  She has read on several social media sites where people posed that it has been very helpful for their depression.  She would like to try it.  We went over possible options for treatment.  She and I both would like for her to be on as little medication as possible.  Usually I do not do more than 1 or 2 changes at a time but I think it is okay in her case.  She will let me know if she has any problems at all. Sleep hygiene.  The Lamictal may cause drowsiness, or the hydroxyzine that we will start may also cause drowsiness so I will not prescribe another medication specific for that. Start Lamictal 25 mg, 1 p.o. nightly for 2 weeks and then increase to 2 p.o. nightly. Counseled patient regarding potential benefits, risks, and side effects of Lamictal to include potential risk of Stevens-Johnson syndrome. Advised patient to stop taking Lamictal and contact office immediately if rash develops and to seek urgent medical attention if rash is severe and/or spreading quickly.  Patient understands and accepts these risks. Continue Vraylar 3 mg, 1 p.o. daily. Start hydroxyzine 10 mg, 1-2 every 8 hours as needed anxiety.  Sedation precautions discussed. Discontinue mirtazapine since it is not  helping and is causing nightmares. Discontinue gabapentin as it is not helpful.  Since she is only on 100 mg, it is okay to stop it without weaning.   Discontinue lithium. Continue therapy with [name], [facility] C. Return in 4 weeks.  Melony Overly, PA-C

## 2019-11-14 ENCOUNTER — Encounter: Payer: Self-pay | Admitting: Family Medicine

## 2019-11-15 ENCOUNTER — Other Ambulatory Visit: Payer: Self-pay

## 2019-11-15 MED ORDER — CARIPRAZINE HCL 3 MG PO CAPS: 3.0000 mg | ORAL_CAPSULE | Freq: Every day | ORAL | 2 refills | Status: DC

## 2019-11-20 DIAGNOSIS — R309 Painful micturition, unspecified: Secondary | ICD-10-CM | POA: Diagnosis not present

## 2019-11-20 DIAGNOSIS — M545 Low back pain: Secondary | ICD-10-CM | POA: Diagnosis not present

## 2019-11-28 DIAGNOSIS — Z01419 Encounter for gynecological examination (general) (routine) without abnormal findings: Secondary | ICD-10-CM | POA: Diagnosis not present

## 2019-11-28 DIAGNOSIS — Z6823 Body mass index (BMI) 23.0-23.9, adult: Secondary | ICD-10-CM | POA: Diagnosis not present

## 2019-11-28 LAB — HM PAP SMEAR: HM Pap smear: NEGATIVE

## 2019-11-28 NOTE — Patient Instructions (Addendum)
Try increasing your daily calories.  Make sure you are not skipping meals.  I recommend you call and schedule a dental exam.  Check and see if you have had the HPV vaccine and if not you can get this here in our office but you would want to check with your insurance to see if it is affordable.     Preventive Care 80-23 Years Old, Female Preventive care refers to visits with your health care provider and lifestyle choices that can promote health and wellness. This includes:  A yearly physical exam. This may also be called an annual well check.  Regular dental visits and eye exams.  Immunizations.  Screening for certain conditions.  Healthy lifestyle choices, such as eating a healthy diet, getting regular exercise, not using drugs or products that contain nicotine and tobacco, and limiting alcohol use. What can I expect for my preventive care visit? Physical exam Your health care provider will check your:  Height and weight. This may be used to calculate body mass index (BMI), which tells if you are at a healthy weight.  Heart rate and blood pressure.  Skin for abnormal spots. Counseling Your health care provider may ask you questions about your:  Alcohol, tobacco, and drug use.  Emotional well-being.  Home and relationship well-being.  Sexual activity.  Eating habits.  Work and work Astronomer.  Method of birth control.  Menstrual cycle.  Pregnancy history. What immunizations do I need?  Influenza (flu) vaccine  This is recommended every year. Tetanus, diphtheria, and pertussis (Tdap) vaccine  You may need a Td booster every 10 years. Varicella (chickenpox) vaccine  You may need this if you have not been vaccinated. Human papillomavirus (HPV) vaccine  If recommended by your health care provider, you may need three doses over 6 months. Measles, mumps, and rubella (MMR) vaccine  You may need at least one dose of MMR. You may also need a second  dose. Meningococcal conjugate (MenACWY) vaccine  One dose is recommended if you are age 51-21 years and a first-year college student living in a residence hall, or if you have one of several medical conditions. You may also need additional booster doses. Pneumococcal conjugate (PCV13) vaccine  You may need this if you have certain conditions and were not previously vaccinated. Pneumococcal polysaccharide (PPSV23) vaccine  You may need one or two doses if you smoke cigarettes or if you have certain conditions. Hepatitis A vaccine  You may need this if you have certain conditions or if you travel or work in places where you may be exposed to hepatitis A. Hepatitis B vaccine  You may need this if you have certain conditions or if you travel or work in places where you may be exposed to hepatitis B. Haemophilus influenzae type b (Hib) vaccine  You may need this if you have certain conditions. You may receive vaccines as individual doses or as more than one vaccine together in one shot (combination vaccines). Talk with your health care provider about the risks and benefits of combination vaccines. What tests do I need?  Blood tests  Lipid and cholesterol levels. These may be checked every 5 years starting at age 78.  Hepatitis C test.  Hepatitis B test. Screening  Diabetes screening. This is done by checking your blood sugar (glucose) after you have not eaten for a while (fasting).  Sexually transmitted disease (STD) testing.  BRCA-related cancer screening. This may be done if you have a family history of breast, ovarian,  tubal, or peritoneal cancers.  Pelvic exam and Pap test. This may be done every 3 years starting at age 52. Starting at age 53, this may be done every 5 years if you have a Pap test in combination with an HPV test. Talk with your health care provider about your test results, treatment options, and if necessary, the need for more tests. Follow these instructions at  home: Eating and drinking   Eat a diet that includes fresh fruits and vegetables, whole grains, lean protein, and low-fat dairy.  Take vitamin and mineral supplements as recommended by your health care provider.  Do not drink alcohol if: ? Your health care provider tells you not to drink. ? You are pregnant, may be pregnant, or are planning to become pregnant.  If you drink alcohol: ? Limit how much you have to 0-1 drink a day. ? Be aware of how much alcohol is in your drink. In the [place] one drink equals one 12 oz bottle of beer (355 mL), one 5 oz glass of wine (148 mL), or one 1 oz glass of hard liquor (44 mL). Lifestyle  Take daily care of your teeth and gums.  Stay active. Exercise for at least 30 minutes on 5 or more days each week.  Do not use any products that contain nicotine or tobacco, such as cigarettes, e-cigarettes, and chewing tobacco. If you need help quitting, ask your health care provider.  If you are sexually active, practice safe sex. Use a condom or other form of birth control (contraception) in order to prevent pregnancy and STIs (sexually transmitted infections). If you plan to become pregnant, see your health care provider for a preconception visit. What's next?  Visit your health care provider once a year for a well check visit.  Ask your health care provider how often you should have your eyes and teeth checked.  Stay up to date on all vaccines. This information is not intended to replace advice given to you by your health care provider. Make sure you discuss any questions you have with your health care provider. Document Revised: 01/12/2018 Document Reviewed: 01/12/2018 Elsevier Patient Education  2020 [employer].

## 2019-11-28 NOTE — Progress Notes (Signed)
Subjective:    Patient ID: Alexis [name], female    DOB: 11-27-1995, 24 y.o.   MRN: [id number]  HPI Chief Complaint  Patient presents with   nonfasting cpe    nonfasting cpe, no concerns. sees obgyn- [facility]   She is here for a complete physical exam.  States she was trying to lose weight and her appetite has been decreased.  States she does not eat 3 meals a day.  She is happy about having lost weight but now she thinks she needs to plateau.  States her medications may be causing her decreased appetite.  Other providers: Dr. Ples Specter OB/GYN  Cardiologist- Rosiland Oz  Psychiatry- Melony Overly  GI- Dr. Elnoria Howard   Vitamin D def- she is taking a prescription supplement once weekly   Social history: Lives with grandmother. works at [employer]. Denies smoking, drinking alcohol, drug use  Diet: fairly healthy, fiber, fruit and meat  Excerise: nothing regular   Immunizations: UTD   Health maintenance:  Mammogram: N/A Colonoscopy: N/A Last Gynecological Exam: yesterday  Last Menstrual cycle: last week  Last Dental Exam: 2018  Last Eye Exam: last year   Wears seatbelt always, smoke detectors in home and functioning, does not text while driving and feels safe in home environment.   Reviewed allergies, medications, past medical, surgical, family, and social history.     Review of Systems Review of Systems Constitutional: -fever, -chills, -sweats, -unexpected weight change,-fatigue ENT: -runny nose, -ear pain, -sore throat Cardiology:  -chest pain, -palpitations, -edema Respiratory: -cough, -shortness of breath, -wheezing Gastroenterology: -abdominal pain, -nausea, -vomiting, -diarrhea, -constipation  Hematology: -bleeding or bruising problems Musculoskeletal: -arthralgias, -myalgias, -joint swelling, -back pain Ophthalmology: -vision changes Urology: -dysuria, -difficulty urinating, -hematuria, -urinary frequency, -urgency Neurology:  -headache, -weakness, -tingling, -numbness       Objective:   Physical Exam BP 110/68    Pulse 68    Ht 5\' 7"  (1.702 m)    Wt 141 lb 6.4 oz (64.1 kg)    LMP 11/14/2019    BMI 22.15 kg/m   General Appearance:    Alert, cooperative, no distress, appears stated age  Head:    Normocephalic, without obvious abnormality, atraumatic  Eyes:    PERRL, conjunctiva/corneas clear, EOM's intact  Ears:    Normal TM's and external ear canals  Nose:  Mask in place  Throat:  Mask in place  Neck:   Supple, no lymphadenopathy;  thyroid:  no   enlargement/tenderness/nodules; no JVD  Back:    Spine nontender, no curvature, ROM normal, no CVA     tenderness  Lungs:     Clear to auscultation bilaterally without wheezes, rales or     ronchi; respirations unlabored  Chest Wall:    No tenderness.  Scar noted from previous surgery   Heart:    Regular rate and rhythm, murmur present  Breast Exam:   OB/GYN  Abdomen:     Soft, non-tender, nondistended, normoactive bowel sounds,    no masses, no hepatosplenomegaly  Genitalia:   OB/GYN  Rectal:    Not performed due to age<40 and no related complaints  Extremities:   No clubbing, cyanosis or edema  Pulses:   2+ and symmetric all extremities  Skin:   Skin color, texture, turgor normal, no rashes or lesions  Lymph nodes:   Cervical, supraclavicular, and axillary nodes normal  Neurologic:   CNII-XII intact, normal strength, sensation and gait          Psych:  Normal mood, affect, hygiene and grooming.         Assessment & Plan:  Routine general medical examination at a health care facility - Plan: POCT Urinalysis DIP (Proadvantage Device)  Vitamin D deficiency  Weight loss  Here today for a CPE, she is not fasting.  She is followed by OB/GYN.  She is also followed by cardiology and psychiatry.  Counseled on healthy lifestyle including diet and exercise.  I recommend that she try to eat small frequent meals and have her first small meal of the day at least 30  to 45 minutes after waking up.  Recommend increasing calories and then see if her weight stabilizes.  Continue vitamin D supplement.  Immunizations reviewed.  Discussed safety and health promotion.

## 2019-11-29 ENCOUNTER — Other Ambulatory Visit: Payer: Self-pay

## 2019-11-29 ENCOUNTER — Encounter: Payer: Self-pay | Admitting: Family Medicine

## 2019-11-29 ENCOUNTER — Ambulatory Visit: Payer: Federal, State, Local not specified - PPO | Admitting: Family Medicine

## 2019-11-29 VITALS — BP 110/68 | HR 68 | Ht 67.0 in | Wt 141.4 lb

## 2019-11-29 DIAGNOSIS — R634 Abnormal weight loss: Secondary | ICD-10-CM | POA: Diagnosis not present

## 2019-11-29 DIAGNOSIS — Z0000 Encounter for general adult medical examination without abnormal findings: Secondary | ICD-10-CM

## 2019-11-29 DIAGNOSIS — E559 Vitamin D deficiency, unspecified: Secondary | ICD-10-CM

## 2019-11-29 LAB — POCT URINALYSIS DIP (PROADVANTAGE DEVICE)
Bilirubin, UA: NEGATIVE
Blood, UA: NEGATIVE
Glucose, UA: NEGATIVE mg/dL
Ketones, POC UA: NEGATIVE mg/dL
Leukocytes, UA: NEGATIVE
Nitrite, UA: NEGATIVE
Protein Ur, POC: NEGATIVE mg/dL
Specific Gravity, Urine: 1.03
Urobilinogen, Ur: NEGATIVE
pH, UA: 5.5 (ref 5.0–8.0)

## 2019-11-30 ENCOUNTER — Other Ambulatory Visit: Payer: Self-pay | Admitting: Physician Assistant

## 2019-11-30 NOTE — Telephone Encounter (Signed)
Has refill until apt on 07/30

## 2019-12-03 ENCOUNTER — Telehealth: Payer: Self-pay | Admitting: Physician Assistant

## 2019-12-03 NOTE — Telephone Encounter (Signed)
Pt called meds not working. Need a call back. On canc list. Need apt Thursday off work.[phone]

## 2019-12-03 NOTE — Telephone Encounter (Signed)
LM to call back with symptoms

## 2019-12-04 ENCOUNTER — Telehealth: Payer: Self-pay | Admitting: Family Medicine

## 2019-12-04 NOTE — Telephone Encounter (Signed)
Requested records received from [facility].  

## 2019-12-04 NOTE — Telephone Encounter (Signed)
LM to call back to discuss medications

## 2019-12-05 ENCOUNTER — Encounter: Payer: Self-pay | Admitting: Internal Medicine

## 2019-12-06 ENCOUNTER — Other Ambulatory Visit: Payer: Self-pay

## 2019-12-06 MED ORDER — CARIPRAZINE HCL 4.5 MG PO CAPS: 4.5000 mg | ORAL_CAPSULE | Freq: Every day | ORAL | 1 refills | Status: DC

## 2019-12-06 NOTE — Telephone Encounter (Signed)
Okay; thanks.

## 2019-12-06 NOTE — Telephone Encounter (Signed)
Yes, increase Vraylar to 4.5 mg qd. Where should I send prescription?

## 2019-12-06 NOTE — Telephone Encounter (Signed)
Nothing to respond to, trying to figure out a note versus comment and where it goes.

## 2019-12-07 ENCOUNTER — Other Ambulatory Visit: Payer: Self-pay | Admitting: Physician Assistant

## 2019-12-14 ENCOUNTER — Telehealth (INDEPENDENT_AMBULATORY_CARE_PROVIDER_SITE_OTHER): Payer: Federal, State, Local not specified - PPO | Admitting: Physician Assistant

## 2019-12-14 ENCOUNTER — Encounter: Payer: Self-pay | Admitting: Physician Assistant

## 2019-12-14 DIAGNOSIS — R443 Hallucinations, unspecified: Secondary | ICD-10-CM

## 2019-12-14 DIAGNOSIS — F411 Generalized anxiety disorder: Secondary | ICD-10-CM

## 2019-12-14 DIAGNOSIS — F251 Schizoaffective disorder, depressive type: Secondary | ICD-10-CM | POA: Diagnosis not present

## 2019-12-14 DIAGNOSIS — F514 Sleep terrors [night terrors]: Secondary | ICD-10-CM | POA: Diagnosis not present

## 2019-12-14 MED ORDER — VRAYLAR 6 MG PO CAPS: 6.0000 mg | ORAL_CAPSULE | Freq: Every day | ORAL | 1 refills | Status: DC

## 2019-12-14 MED ORDER — LAMOTRIGINE 100 MG PO TABS
100.0000 mg | ORAL_TABLET | Freq: Every day | ORAL | 1 refills | Status: DC
Start: 2019-12-14 — End: 2020-01-07
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
## 2019-12-19 ENCOUNTER — Encounter: Payer: Self-pay | Admitting: Neurology

## 2019-12-27 ENCOUNTER — Encounter: Payer: Self-pay | Admitting: Family Medicine

## 2019-12-27 ENCOUNTER — Other Ambulatory Visit: Payer: Self-pay

## 2019-12-27 ENCOUNTER — Ambulatory Visit (HOSPITAL_COMMUNITY)
Admission: EM | Admit: 2019-12-27 | Discharge: 2019-12-27 | Disposition: A | Discharge status: Home / Self Care | Payer: Federal, State, Local not specified - PPO | Attending: Family Medicine | Admitting: Family Medicine

## 2019-12-27 ENCOUNTER — Telehealth: Payer: Federal, State, Local not specified - PPO | Admitting: Family Medicine

## 2019-12-27 ENCOUNTER — Encounter (HOSPITAL_COMMUNITY): Payer: Self-pay

## 2019-12-27 ENCOUNTER — Other Ambulatory Visit: Payer: Federal, State, Local not specified - PPO

## 2019-12-27 VITALS — Ht 65.0 in | Wt 138.0 lb

## 2019-12-27 DIAGNOSIS — Z1152 Encounter for screening for COVID-19: Secondary | ICD-10-CM | POA: Diagnosis not present

## 2019-12-27 DIAGNOSIS — R413 Other amnesia: Secondary | ICD-10-CM | POA: Diagnosis not present

## 2019-12-27 LAB — SARS CORONAVIRUS 2 (TAT 6-24 HRS): SARS Coronavirus 2: NEGATIVE

## 2019-12-27 NOTE — ED Triage Notes (Signed)
Pt states she passed out at work yesterday, not sure why and did not hit head; pt states this has never happened before.  PCP requested her get covid testing her before he will see her.

## 2019-12-27 NOTE — Progress Notes (Addendum)
   Subjective:    Patient ID: Alexis [name], female    DOB: December 06, 1995, 24 y.o.   MRN: [id number]  HPI I connected with  [name] Thursby on 12/27/19 by a video enabled telemedicine application and verified that I am speaking with the correct person using two identifiers. I discussed the limitations of evaluation and management by telemedicine. The patient expressed understanding and agreed to proceed.  Encounter carried out on caregility.  I am in my office patient at home She states that yesterday while she was in her car, she made a call to her boyfriend and finished the call and the next thing she remembered was waking up for an alarm to return to work.  She did complain of headache, being slightly disoriented but no evidence of incontinence.  She did eat something before that.  Last night she did have a slight fever.  Today she does complain of some chest congestion fever, headache and myalgias.  She has been Covid vaccinated.  Today's temperature is 97.9.   Review of Systems     Objective:   Physical Exam Alert and in no distress otherwise not examined       Assessment & Plan:  Amnesia Since she is still having some symptoms of fever headache and myalgias I recommend she get Covid tested and if that is negative then she should make an appointment for further evaluation.  Hard to say what is exactly going on.

## 2019-12-27 NOTE — ED Notes (Signed)
Patient specifically is in department for covid testing.  Patient has reached out to her provider and patient and provider plan appt after covid test is resulted

## 2019-12-27 NOTE — Discharge Instructions (Signed)
If your Covid-19 test is positive, you will get a phone call from [facility] regarding your results. If your Covid-19 test is negative, you will NOT get a phone call from [facility] with your results. You may view your results on MyChart. If you do not have a MyChart account, sign up instructions are in your discharge papers. ° °

## 2019-12-28 ENCOUNTER — Encounter: Payer: Self-pay | Admitting: Family Medicine

## 2019-12-28 ENCOUNTER — Telehealth: Payer: Self-pay

## 2019-12-28 ENCOUNTER — Telehealth (INDEPENDENT_AMBULATORY_CARE_PROVIDER_SITE_OTHER): Payer: Federal, State, Local not specified - PPO | Admitting: Family Medicine

## 2019-12-28 VITALS — BP 117/59 | HR 65 | Temp 98.7°F | Wt 138.0 lb

## 2019-12-28 DIAGNOSIS — H9311 Tinnitus, right ear: Secondary | ICD-10-CM

## 2019-12-28 DIAGNOSIS — R509 Fever, unspecified: Secondary | ICD-10-CM

## 2019-12-28 DIAGNOSIS — H9203 Otalgia, bilateral: Secondary | ICD-10-CM

## 2019-12-28 DIAGNOSIS — R0981 Nasal congestion: Secondary | ICD-10-CM | POA: Diagnosis not present

## 2019-12-28 DIAGNOSIS — R55 Syncope and collapse: Secondary | ICD-10-CM

## 2019-12-28 MED ORDER — AMOXICILLIN 875 MG PO TABS: 875.0000 mg | ORAL_TABLET | Freq: Two times a day (BID) | ORAL | 0 refills | Status: DC

## 2019-12-28 NOTE — Progress Notes (Signed)
   Subjective:  Documentation for virtual audio and video telecommunications through [employer] encounter:  The patient was located at home. 2 patient identifiers used.  The provider was located in the office. The patient did consent to this visit and is aware of possible charges through their insurance for this visit.  The other persons participating in this telemedicine service were none. Time spent on call was 17 minutes and in review of previous records 20 minutes total.  This virtual service is not related to other E/M service within previous 7 days.   Patient ID: Alexis [name], female    DOB: July 08, 1995, 24 y.o.   MRN: [id number]  HPI Chief Complaint  Patient presents with  . fever    congestion, passed out at work 2 days ago. pain in both, splitting headaches for 2 days. body aches,    This is a virtual visit to discuss illness including fever, body aches, headache, nasal congestion.  States yesterday both ears started hurting and her right ear is ringing. States 2 days ago she had an unwitnessed syncopal episode while sitting in her car.  She was not driving.  Denies seizure activity.  She had eaten prior but states she was most likely dehydrated.  States she has not been getting much sleep lately.  States she gets 4 hours at the most nightly.  States she is working with Melony Overly on medications for sleep.  Recently hydroxyzine was increased.  Did not tolerate mirtazapine due to nightmares.  States she had a negative Covid test at [facility].  States she is fully vaccinated.   States her appt with her neurologist, Dr. Terrace Arabia, was pushed back to October.   Denies chest pain, productive cough, shortness of breath, abdominal pain, nausea, vomiting, diarrhea.   Review of Systems Pertinent positives and negatives in the history of present illness.     Objective:   Physical Exam BP (!) 117/59   Pulse 65   Temp 98.7 F (37.1 C)   Wt 138 lb (62.6 kg)   BMI 22.96  kg/m   Alert and oriented and in no acute distress.  Respirations unlabored.  Normal speech, mood and thought process.      Assessment & Plan:  Acute ear pain, bilateral - Plan: amoxicillin (AMOXIL) 875 MG tablet  Tinnitus of right ear - Plan: amoxicillin (AMOXIL) 875 MG tablet  Fever, unspecified fever cause - Plan: amoxicillin (AMOXIL) 875 MG tablet  Nasal congestion  Syncope, unspecified syncope type  She reports having a negative Covid test at [facility] urgent care.  Discussed that her syncopal episode was probably multifactorial including lack of sleep, dehydration and impending illness.  Encouraged her to hydrate, take Tylenol for headache, body aches and fever.  Try Mucinex for congestion.  Suspect she has an early ear infection and I will prescribe amoxicillin for her.  Recommend that if she is getting much worse over the weekend that she be evaluated at urgent care or the emergency department.  Follow-up with me as needed in the next week or 2.  I recommend she get in touch with Melony Overly, her behavioral health specialist, to discuss insomnia and psychotropic medications.

## 2019-12-28 NOTE — Telephone Encounter (Signed)
Pt. Scheduled this afternoon for a virtual.

## 2019-12-28 NOTE — Telephone Encounter (Signed)
Pt. Called stating that she saw Dr. Susann Givens yesterday for a virtual and was told to get tested for covid before she could be seen in the office. She was tested yesterday and it was negative. She was told by Dr. Susann Givens she could be seen in office today, she has congestion, fever, and had passed out at work. Just wanted to run it by you before scheduling her.

## 2019-12-28 NOTE — Telephone Encounter (Signed)
I just replied to her mychart message. I am willing to see her virtually but not in office with her symptoms. I have seen false negative Covid tests.

## 2020-01-03 ENCOUNTER — Encounter: Payer: Self-pay | Admitting: Family Medicine

## 2020-01-03 ENCOUNTER — Ambulatory Visit (INDEPENDENT_AMBULATORY_CARE_PROVIDER_SITE_OTHER): Payer: Federal, State, Local not specified - PPO | Admitting: Family Medicine

## 2020-01-03 ENCOUNTER — Other Ambulatory Visit: Payer: Self-pay

## 2020-01-03 VITALS — BP 110/70 | HR 84 | Temp 97.8°F | Ht 66.5 in | Wt 142.2 lb

## 2020-01-03 DIAGNOSIS — H6123 Impacted cerumen, bilateral: Secondary | ICD-10-CM

## 2020-01-03 DIAGNOSIS — R55 Syncope and collapse: Secondary | ICD-10-CM | POA: Diagnosis not present

## 2020-01-03 DIAGNOSIS — H9203 Otalgia, bilateral: Secondary | ICD-10-CM

## 2020-01-03 DIAGNOSIS — Z5181 Encounter for therapeutic drug level monitoring: Secondary | ICD-10-CM

## 2020-01-03 DIAGNOSIS — F329 Major depressive disorder, single episode, unspecified: Secondary | ICD-10-CM

## 2020-01-03 NOTE — Progress Notes (Signed)
Chief Complaint  Patient presents with  . Ear Pain    and cannot hear anything of her ear- R worse than L.     Patient presents with complaint of decreased hearing and ear pain. She was seen twice for virtual visits last week 8/12 she saw Dr. Susann Givens after possibly passing out in her car.  This occurred on 8/11--She recalls talking to her BF on the phone, then thinks she passed out. She had been eating lunch in her car at work.  She went home, felt sick, ears felt hot/burning, ears were ringing. She had reported some chest congestion, fever, HA and myalgias, so COVID test was recommended. T99 on 8/11. She had negative COVID test at [facility]. She is fully vaccinated.  8/12 she noticed decreased hearing, then 8/13 had no hearing out of R ear. She had virtual visit with Vickie She was put on amoxicillin for her ear pain.  Hearing came back while on the ABX, but reports that this morning the hearing is diminished in R>L ears. She is still on the Amoxil  She is under the care of Melony Overly for psych issues (PMH documented as schizoaffective d/o, GAD and bipolar in her chart).  She has been on Vraylar and lamictal x 3 mos. She states that her moods are not well controlled.  +work stress She has insomnia, feels fatigued.  PMH reviewed--h/o ToF in addition to the psych dx mentioned. PSH and SH reviewed.  Outpatient Encounter Medications as of 01/03/2020  Medication Sig  . amoxicillin (AMOXIL) 875 MG tablet Take 1 tablet (875 mg total) by mouth 2 (two) times daily.  . Cariprazine HCl (VRAYLAR) 6 MG CAPS Take 1 capsule (6 mg total) by mouth daily.  . hydrOXYzine (ATARAX/VISTARIL) 10 MG tablet Take 1-2 tablets (10-20 mg total) by mouth every 8 (eight) hours as needed.  . lamoTRIgine (LAMICTAL) 100 MG tablet Take 1 tablet (100 mg total) by mouth daily.  Kathrynn Running Estrad-Fe Biphas (LO LOESTRIN FE PO) Lo Loestrin Fe 1 mg-10 mcg (24)/10 mcg (2) tablet  . Vitamin D, Ergocalciferol,  (DRISDOL) 1.25 MG (50000 UNIT) CAPS capsule Take 1 capsule (50,000 Units total) by mouth every 7 (seven) days.  Marland Kitchen EPINEPHrine 0.3 mg/0.3 mL IJ SOAJ injection Inject 0.3 mLs (0.3 mg total) into the muscle as needed for anaphylaxis. (Patient not taking: Reported on 01/03/2020)   No facility-administered encounter medications on file as of 01/03/2020.   Allergies  Allergen Reactions  . Dopamine     Makes WBC rise  . Peanut-Containing Drug Products     Hazel nuts [place] nuts   ROS: no further fever.  She vomited "for 5 minutes" this morning.  This is not uncommon--has vomited once daily, usually in the morning, for the last 1.5 years.  Currently denies any nausea.  She reports she had h/o feeding tube as a child.  Stomach tends to hurt and "act up" periodically.   Denies heartburn. Normal bowels No urinary complaints No rashes, bleeding, bruising. Ear pain and decreased hearing per HPI.  Denies sinus pain, cough, shortness of breath.   PHYSICAL EXAM:  BP 110/70   Pulse 84   Temp 97.8 F (36.6 C) (Oral)   Ht 5' 6.5" (1.689 m)   Wt 142 lb 3.2 oz (64.5 kg)   BMI 22.61 kg/m   Pleasant, well-appearing female, somewhat anxious, in no distress HEENT: conjunctiva and sclera are clear bilaterally. R TM--partly obscured by cerumen. Visualized portion is normal--no erythema or bulging.  There is pain with movement of the external ear, and some sensitivity in the canal. L TM obscured by cerumen. No pain with movement of external ear.   OP is clear After ear lavage--both TM's and EAC's appear normal. Tender at both TMJ's, and diffusely at the temporal regions and jaw Neck: no lymphadenopathy, thyromegaly or mass Heart: regular rate and rhythem.  There is 3/6, radiates everywhere, including back Lungs clear bilaterally Extremities: no edema Neuro: alert and oriented, cranial nerves intact. Normal gait  EKG: NSR, no abnormalities noted  ASSESSMENT/PLAN:  Syncope, unspecified syncope type -  DDx reviewed by 2 prior providers, but exam done virtually. Check labs and EKG. Encouraged to stay well hydrated - Plan: CBC with Differential/Platelet, Comprehensive metabolic panel, EKG 12-Lead  Medication monitoring encounter - Plan: CBC with Differential/Platelet, Comprehensive metabolic panel  Otalgia, bilateral - poss related to stress, TMJ, having MSK tenderness.  Reassured no e/o active ear infection. Cerumen removed  Bilateral hearing loss due to cerumen impaction - cerumen easily removed with lavage.  TM/EAC's normal on exam after removal  Major depressive disorder with current active episode, unspecified depression episode severity, unspecified whether recurrent - significant work stress; moods are poor.  Encouraged her to follow-up with Melony Overly, especially with regards to her med concerns   Be sure to drink plenty of fluids. Get plenty of rest. Continue your current medications. Be sure to let Melony Overly know that you have been sick. Stress/clenching/grinding can contribute to ear pain.   You can try using heat or ice, and do jaw exercises to lessen the tension to see if that helps.

## 2020-01-03 NOTE — Patient Instructions (Signed)
  Be sure to drink plenty of fluids. Get plenty of rest. Continue your current medications. Complete the antibiotic course.  Be sure to let Melony Overly know that you have been sick. Stress/clenching/grinding can contribute to ear pain. Try ice, heat, and anti-inflammatories as needed for pain

## 2020-01-04 LAB — COMPREHENSIVE METABOLIC PANEL
ALT: 12 IU/L (ref 0–32)
AST: 17 IU/L (ref 0–40)
Albumin/Globulin Ratio: 1.9 (ref 1.2–2.2)
Albumin: 4.9 g/dL (ref 3.9–5.0)
Alkaline Phosphatase: 67 IU/L (ref 48–121)
BUN/Creatinine Ratio: 9 (ref 9–23)
BUN: 9 mg/dL (ref 6–20)
Bilirubin Total: 0.2 mg/dL (ref 0.0–1.2)
CO2: 17 mmol/L — ABNORMAL LOW (ref 20–29)
Calcium: 9.9 mg/dL (ref 8.7–10.2)
Chloride: 104 mmol/L (ref 96–106)
Creatinine, Ser: 0.98 mg/dL (ref 0.57–1.00)
GFR calc Af Amer: 94 mL/min/{1.73_m2} (ref >59)
GFR calc non Af Amer: 82 mL/min/{1.73_m2} (ref >59)
Globulin, Total: 2.6 g/dL (ref 1.5–4.5)
Glucose: 98 mg/dL (ref 65–99)
Potassium: 4.4 mmol/L (ref 3.5–5.2)
Sodium: 140 mmol/L (ref 134–144)
Total Protein: 7.5 g/dL (ref 6.0–8.5)

## 2020-01-04 LAB — CBC WITH DIFFERENTIAL/PLATELET
Basophils Absolute: 0.1 10*3/uL (ref 0.0–0.2)
Basos: 2 %
EOS (ABSOLUTE): 0 10*3/uL (ref 0.0–0.4)
Eos: 1 %
Hematocrit: 44 % (ref 34.0–46.6)
Hemoglobin: 14.3 g/dL (ref 11.1–15.9)
Immature Grans (Abs): 0 10*3/uL (ref 0.0–0.1)
Immature Granulocytes: 0 %
Lymphocytes Absolute: 0.7 10*3/uL (ref 0.7–3.1)
Lymphs: 24 %
MCH: 29.4 pg (ref 26.6–33.0)
MCHC: 32.5 g/dL (ref 31.5–35.7)
MCV: 91 fL (ref 79–97)
Monocytes Absolute: 0.5 10*3/uL (ref 0.1–0.9)
Monocytes: 17 %
Neutrophils Absolute: 1.6 10*3/uL (ref 1.4–7.0)
Neutrophils: 56 %
Platelets: 139 10*3/uL — ABNORMAL LOW (ref 150–450)
RBC: 4.86 x10E6/uL (ref 3.77–5.28)
RDW: 12.8 % (ref 11.7–15.4)
WBC: 2.9 10*3/uL — ABNORMAL LOW (ref 3.4–10.8)

## 2020-01-05 ENCOUNTER — Other Ambulatory Visit: Payer: Self-pay | Admitting: Physician Assistant

## 2020-01-06 ENCOUNTER — Encounter: Payer: Self-pay | Admitting: Family Medicine

## 2020-01-07 ENCOUNTER — Telehealth: Payer: Self-pay | Admitting: Physician Assistant

## 2020-01-07 NOTE — Telephone Encounter (Signed)
Patient called and left a message stating that the medicines are not working any more, She is having nightmares and hallucinations. She needs a med adjustment. Please give her a call at [phone]

## 2020-01-07 NOTE — Telephone Encounter (Signed)
Left detailed message with information and instructions and to call back with questions.  Send Rx to [facility].

## 2020-01-07 NOTE — Telephone Encounter (Signed)
Let us change to Saphris.  Have her wean off the Vraylar.  She can either twist the capsule apart, and pour approximately one half of the powder in a teaspoon of applesauce and use that for approximately 4 days and will start the Saphris at the same time.

## 2020-01-08 ENCOUNTER — Other Ambulatory Visit: Payer: Self-pay | Admitting: Physician Assistant

## 2020-01-08 MED ORDER — ASENAPINE MALEATE 10 MG SL SUBL: 10.0000 mg | SUBLINGUAL_TABLET | Freq: Two times a day (BID) | SUBLINGUAL | 1 refills | Status: DC

## 2020-01-08 NOTE — Telephone Encounter (Signed)
Pt LM need Saphris Rx sent in. Can't start weaning off until she gets Saphris.

## 2020-01-08 NOTE — Telephone Encounter (Signed)
Prescription was sent to [facility]

## 2020-01-08 NOTE — Telephone Encounter (Signed)
Please submit Rx for Saphris [facility]

## 2020-01-10 ENCOUNTER — Ambulatory Visit: Payer: Federal, State, Local not specified - PPO | Admitting: Neurology

## 2020-01-10 ENCOUNTER — Other Ambulatory Visit: Payer: Self-pay

## 2020-01-10 ENCOUNTER — Telehealth: Payer: Self-pay | Admitting: Physician Assistant

## 2020-01-10 ENCOUNTER — Ambulatory Visit: Payer: Federal, State, Local not specified - PPO | Admitting: Family Medicine

## 2020-01-10 ENCOUNTER — Encounter: Payer: Self-pay | Admitting: Family Medicine

## 2020-01-10 VITALS — BP 110/70 | HR 82 | Temp 97.2°F | Resp 16 | Wt 139.4 lb

## 2020-01-10 DIAGNOSIS — R55 Syncope and collapse: Secondary | ICD-10-CM

## 2020-01-10 DIAGNOSIS — E861 Hypovolemia: Secondary | ICD-10-CM

## 2020-01-10 DIAGNOSIS — I9589 Other hypotension: Secondary | ICD-10-CM | POA: Diagnosis not present

## 2020-01-10 DIAGNOSIS — F329 Major depressive disorder, single episode, unspecified: Secondary | ICD-10-CM

## 2020-01-10 LAB — POCT URINALYSIS DIP (PROADVANTAGE DEVICE)
Bilirubin, UA: NEGATIVE
Blood, UA: NEGATIVE
Glucose, UA: NEGATIVE mg/dL
Ketones, POC UA: NEGATIVE mg/dL
Leukocytes, UA: NEGATIVE
Nitrite, UA: NEGATIVE
Protein Ur, POC: NEGATIVE mg/dL
Specific Gravity, Urine: 1.015
Urobilinogen, Ur: NEGATIVE
pH, UA: 7.5 (ref 5.0–8.0)

## 2020-01-10 NOTE — Telephone Encounter (Signed)
It is a possibility that it's related. Was she hurt when she fainted? I hope not! Have her take it only at night, right before she goes to bed. And when she gets up from sitting or lying down, do it slowly, stand there a few seconds before she starts walking, to prevent orthostatic hypotension. Make sure she schedules an appt with me within the next month

## 2020-01-10 NOTE — Patient Instructions (Signed)
You appear to have orthostatic hypotension which is due to not consuming enough fluids.  I recommend that you avoid skipping meals and make sure you are eating 3 meals a day as well as a midmorning and midafternoon snack. Please include protein and healthy fats and not just carbohydrates.  You should drink water throughout the day.  Your urine should be a very light yellow.  Let your psychiatrist know about the problem you had today since you are undergoing changes in your medications.  Follow-up with neurology as scheduled

## 2020-01-10 NOTE — Progress Notes (Signed)
Subjective:    Patient ID: Alexis [name], female    DOB: 11/14/1995, 24 y.o.   MRN: [id number]  HPI Chief Complaint  Patient presents with  . fainted at work    fainted at work, around 10ish this morning. ears started ringing and vibrating, had blurred vision and heard someone says she was going down. did not hit head but does have a mild headache. no blurred vision or dizziness now, just vibrating ears   Here for evaluation for syncopal episode today.  States she felt tired and dizzy this morning when she woke up even though she "got a good nights sleep". 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 She has been weaning off of the [employer].  Her first dose of the Saphris was 2 nights ago. States dizziness is a side affect of the medication.   She ate oatmeal and drank a small amount of water and went to work. After being at work for an hour she had  Developed blurry vision then tunnel vision, her ears were ringing and she felt dizzy, legs were weak.  States she heard her boss say  "she's going down". She had passed out at work. Woke up surrounded by co-workers. Felt groggy when she woke up.   States she did not fall, she lowered herself to the ground.  No loss of control of bowels or bladder.  Denies fever, chills, chest pain, palpitations, shortness of breath, abdominal pain, N/V/D, urinary symptoms.   States they checked her BP and it was ok. Pulse was 68.   She went home and laid down. Ate spaghetti. No fluids since the incident.   States now she feels tired and has a mild headache. Normal vision.   She had an appointment with neurologist today but they rescheduled her to 02/19/2020  Denies any chance of pregnancy. States she has never had sex     Review of Systems Pertinent positives and negatives in the history of present illness.     Objective:   Physical Exam Constitutional:      General: She is not in acute distress.    Appearance: Normal  appearance. She is not ill-appearing.  Eyes:     General: No visual field deficit.    Extraocular Movements: Extraocular movements intact.     Conjunctiva/sclera: Conjunctivae normal.     Pupils: Pupils are equal, round, and reactive to light.  Cardiovascular:     Rate and Rhythm: Normal rate and regular rhythm.     Pulses: Normal pulses.     Heart sounds: Murmur heard.      Comments: Murmur is not new, unchanged from previous exam Pulmonary:     Effort: Pulmonary effort is normal.     Breath sounds: Normal breath sounds.  Musculoskeletal:        General: No swelling. Normal range of motion.     Cervical back: Normal range of motion and neck supple.     Right lower leg: No edema.     Left lower leg: No edema.  Skin:    General: Skin is warm and dry.     Capillary Refill: Capillary refill takes less than 2 seconds.     Coloration: Skin is not pale.     Findings: No rash.  Neurological:     General: No focal deficit present.     Mental Status: She is alert and oriented to person, place, and time.     Cranial Nerves: No cranial nerve  deficit.     Sensory: Sensation is intact.     Motor: No weakness or pronator drift.     Coordination: Romberg sign negative. Coordination normal. Finger-Nose-Finger Test and Heel to [facility] Test normal.     Gait: Gait normal.     Deep Tendon Reflexes: Reflexes normal.  Psychiatric:        Mood and Affect: Mood normal.        Thought Content: Thought content normal.        Judgment: Judgment normal.    BP 110/70 (BP Location: Right Arm, Patient Position: Standing, Cuff Size: Normal)   Pulse 82   Temp (!) 97.2 F (36.2 C)   Resp 16   Wt 139 lb 6.4 oz (63.2 kg)   SpO2 98%   BMI 22.16 kg/m       Assessment & Plan:  Syncope, unspecified syncope type - Plan: CBC with Differential/Platelet, Comprehensive metabolic panel, POCT Urinalysis DIP (Proadvantage Device)  Hypotension due to hypovolemia - Plan: CBC with Differential/Platelet,  Comprehensive metabolic panel, POCT Urinalysis DIP (Proadvantage Device)  Major depressive disorder with current active episode, unspecified depression episode severity, unspecified whether recurrent  UA negative.  Check CBC, CMP and follow up.  Reviewed recent visit for same with Dr. Lynelle Doctor.  Discussed that she is borderline orthostatic and should increase her fluids. Discussed eating small frequent meals with healthy fats and protein and not only carbohydrates. Recommend she discuss her symptoms with her psychiatrist since she is in the process of a medication change. Discussed most likely her symptoms are multi-factorial. She will follow up with me as needed and with neurologist as scheduled. Discussed case with Dr. Susann Givens and he agrees with plan of care.

## 2020-01-10 NOTE — Telephone Encounter (Signed)
Pt called, she was put on Saphris and this morning she got dizzy and light headed. She passed out at her vet's office. They checked her vitals. She is a little tired now.She wants to know if this may be a side effect? [phone].

## 2020-01-11 LAB — COMPREHENSIVE METABOLIC PANEL
ALT: 15 IU/L (ref 0–32)
AST: 17 IU/L (ref 0–40)
Albumin/Globulin Ratio: 2.1 (ref 1.2–2.2)
Albumin: 4.6 g/dL (ref 3.9–5.0)
Alkaline Phosphatase: 72 IU/L (ref 48–121)
BUN/Creatinine Ratio: 9 (ref 9–23)
BUN: 7 mg/dL (ref 6–20)
Bilirubin Total: 0.2 mg/dL (ref 0.0–1.2)
CO2: 20 mmol/L (ref 20–29)
Calcium: 9.5 mg/dL (ref 8.7–10.2)
Chloride: 103 mmol/L (ref 96–106)
Creatinine, Ser: 0.82 mg/dL (ref 0.57–1.00)
GFR calc Af Amer: 117 mL/min/{1.73_m2} (ref >59)
GFR calc non Af Amer: 101 mL/min/{1.73_m2} (ref >59)
Globulin, Total: 2.2 g/dL (ref 1.5–4.5)
Glucose: 86 mg/dL (ref 65–99)
Potassium: 4.5 mmol/L (ref 3.5–5.2)
Sodium: 137 mmol/L (ref 134–144)
Total Protein: 6.8 g/dL (ref 6.0–8.5)

## 2020-01-11 LAB — CBC WITH DIFFERENTIAL/PLATELET
Basophils Absolute: 0.1 10*3/uL (ref 0.0–0.2)
Basos: 1 %
EOS (ABSOLUTE): 0.1 10*3/uL (ref 0.0–0.4)
Eos: 2 %
Hematocrit: 42.3 % (ref 34.0–46.6)
Hemoglobin: 14.1 g/dL (ref 11.1–15.9)
Immature Grans (Abs): 0 10*3/uL (ref 0.0–0.1)
Immature Granulocytes: 0 %
Lymphocytes Absolute: 1 10*3/uL (ref 0.7–3.1)
Lymphs: 21 %
MCH: 29.1 pg (ref 26.6–33.0)
MCHC: 33.3 g/dL (ref 31.5–35.7)
MCV: 87 fL (ref 79–97)
Monocytes Absolute: 0.5 10*3/uL (ref 0.1–0.9)
Monocytes: 11 %
Neutrophils Absolute: 3.1 10*3/uL (ref 1.4–7.0)
Neutrophils: 65 %
Platelets: 135 10*3/uL — ABNORMAL LOW (ref 150–450)
RBC: 4.84 x10E6/uL (ref 3.77–5.28)
RDW: 12.6 % (ref 11.7–15.4)
WBC: 4.8 10*3/uL (ref 3.4–10.8)

## 2020-01-11 NOTE — Telephone Encounter (Signed)
Left patient detailed information and to call back to further discuss.

## 2020-01-14 ENCOUNTER — Encounter: Payer: Self-pay | Admitting: Family Medicine

## 2020-01-22 ENCOUNTER — Other Ambulatory Visit: Payer: Self-pay | Admitting: Physician Assistant

## 2020-01-22 DIAGNOSIS — F251 Schizoaffective disorder, depressive type: Secondary | ICD-10-CM

## 2020-01-24 ENCOUNTER — Other Ambulatory Visit: Payer: Self-pay | Admitting: Family Medicine

## 2020-01-24 DIAGNOSIS — E559 Vitamin D deficiency, unspecified: Secondary | ICD-10-CM

## 2020-01-24 NOTE — Telephone Encounter (Signed)
Transition to otc per notes

## 2020-01-30 ENCOUNTER — Other Ambulatory Visit: Payer: Self-pay | Admitting: Physician Assistant

## 2020-01-31 NOTE — Telephone Encounter (Signed)
Is this the correct dose? Requesting 90 day

## 2020-02-10 ENCOUNTER — Other Ambulatory Visit: Payer: Self-pay

## 2020-02-10 ENCOUNTER — Ambulatory Visit (HOSPITAL_COMMUNITY)
Admission: EM | Admit: 2020-02-10 | Discharge: 2020-02-11 | Disposition: A | Discharge status: Other Acute Inpatient Hospital | Payer: Federal, State, Local not specified - PPO | Attending: Family | Admitting: Family

## 2020-02-10 DIAGNOSIS — R45851 Suicidal ideations: Secondary | ICD-10-CM | POA: Insufficient documentation

## 2020-02-10 DIAGNOSIS — Z915 Personal history of self-harm: Secondary | ICD-10-CM | POA: Diagnosis not present

## 2020-02-10 DIAGNOSIS — F251 Schizoaffective disorder, depressive type: Secondary | ICD-10-CM | POA: Insufficient documentation

## 2020-02-10 DIAGNOSIS — F259 Schizoaffective disorder, unspecified: Secondary | ICD-10-CM | POA: Diagnosis not present

## 2020-02-10 LAB — POCT URINE DRUG SCREEN - MANUAL ENTRY (I-SCREEN)
POC Amphetamine UR: NOT DETECTED
POC Buprenorphine (BUP): NOT DETECTED
POC Cocaine UR: NOT DETECTED
POC Marijuana UR: NOT DETECTED
POC Methadone UR: NOT DETECTED
POC Methamphetamine UR: NOT DETECTED
POC Morphine: NOT DETECTED
POC Oxazepam (BZO): NOT DETECTED
POC Oxycodone UR: NOT DETECTED
POC Secobarbital (BAR): NOT DETECTED

## 2020-02-10 LAB — RESPIRATORY PANEL BY RT PCR (FLU A&B, COVID)
Influenza A by PCR: NEGATIVE
Influenza B by PCR: NEGATIVE
SARS Coronavirus 2 by RT PCR: NEGATIVE

## 2020-02-10 LAB — POCT PREGNANCY, URINE: Preg Test, Ur: NEGATIVE

## 2020-02-10 LAB — POC SARS CORONAVIRUS 2 AG -  ED: SARS Coronavirus 2 Ag: NEGATIVE

## 2020-02-10 MED ORDER — ALUM & MAG HYDROXIDE-SIMETH 200-200-20 MG/5ML PO SUSP: 30.0000 mL | ORAL | Status: DC | PRN

## 2020-02-10 MED ORDER — ASENAPINE MALEATE 5 MG SL SUBL
5.0000 mg | SUBLINGUAL_TABLET | Freq: Two times a day (BID) | SUBLINGUAL | Status: DC
  Administered 2020-02-10 (×2): 5 mg via SUBLINGUAL
  Filled 2020-02-10 (×2): qty 1

## 2020-02-10 MED ORDER — ACETAMINOPHEN 325 MG PO TABS: 650.0000 mg | ORAL_TABLET | Freq: Four times a day (QID) | ORAL | Status: DC | PRN

## 2020-02-10 MED ORDER — LAMOTRIGINE 100 MG PO TABS
100.0000 mg | ORAL_TABLET | Freq: Once | ORAL | Status: AC
  Administered 2020-02-10: 100 mg via ORAL
  Filled 2020-02-10: qty 1

## 2020-02-10 MED ORDER — TRAZODONE HCL 50 MG PO TABS: 50.0000 mg | ORAL_TABLET | Freq: Every evening | ORAL | Status: DC | PRN

## 2020-02-10 MED ORDER — MAGNESIUM HYDROXIDE 400 MG/5ML PO SUSP: 30.0000 mL | Freq: Every day | ORAL | Status: DC | PRN

## 2020-02-10 NOTE — ED Notes (Signed)
Items left with mom.

## 2020-02-10 NOTE — ED Notes (Signed)
Pt A&O, calm & cooperative, resting on pull out through evening. Up to bathroom & for evening meds. Reports feeling of hallucinating a voice while sleeping, states it has never happened before. Reassurance & support offered. Needs met & safety maintained. Pt stable on feet while ambulating.

## 2020-02-10 NOTE — ED Notes (Signed)
Patient denies SI/HI when admitted to unit. Patient is admitted for SI thoughts. Patient oriented to unit and staff. Patient skin assessed and is intact. Patient and belongings searched and no contraband found. Monitoring continues.

## 2020-02-10 NOTE — ED Provider Notes (Addendum)
Behavioral Health Admission [facility]&P [facility])  Date: 02/10/20 Patient Name: Alexis [name] MRN: [id number] Chief Complaint: No chief complaint on file.     Diagnoses:  Final diagnoses:  None    HPI: presents to [facility] accompanied with her mother.  Alexis cites suicidal ideation with plan and intent.  She appears flat, guarded but pleasant.  Reports auditory and visual hallucinations.  States she is followed by psychiatry and therapy which she is prescribed Lamictal, Saphris and Vistaril states she has been taken and tolerating medications well. Patient stated that I don't think that the medication is helping.  Patient denies previous inpatient admissions.  Denies history of alcohol or illicit drug use.  Reported history with suicide attempt by hanging few years prior.  Recommended for inpatient admission.  Support, encouragement and reassurance was provided.  PHQ 2-9:    Office Visit from 11/01/2019 in [place]  Thoughts that you would be better off dead, or of hurting yourself in some way Several days  [not in the last two weeks]  PHQ-9 Total Score 18        Office Visit from 11/01/2019 in [place] Error: Q3, 4, or 5 should not be populated when Q2 is No       Total Time spent with patient: 15 minutes  Musculoskeletal  Strength & Muscle Tone: within normal limits Gait & Station: normal Patient leans: N/A  Psychiatric Specialty Exam  Presentation General Appearance: Appropriate for Environment  Eye Contact:Good  Speech:Clear and Coherent  Speech Volume:Normal  Handedness:No data recorded  Mood and Affect  Mood:Anxious;Depressed  Affect:Appropriate   Thought Process  Thought Processes:Coherent  Descriptions of Associations:Intact  Orientation:Full (Time, Place and Person)  Thought Content:Logical  Hallucinations:Hallucinations: Auditory  Ideas of  Reference:Paranoia  Suicidal Thoughts:Suicidal Thoughts: Yes, Passive SI Passive Intent and/or Plan: With Plan  Homicidal Thoughts:Homicidal Thoughts: No   Sensorium  Memory:Immediate Good;Recent Good;Remote Good  Judgment:Fair  Insight:Fair   Executive Functions  Concentration:Fair  Attention Span:Good  Recall:Good  Fund of Knowledge:Fair  Language:Fair   Psychomotor Activity  Psychomotor Activity:Psychomotor Activity: Normal   Assets  Assets:Communication Skills;Social Support   Sleep  Sleep:Sleep: Fair Number of Hours of Sleep: 5   Physical Exam ROS  Blood pressure 120/69, pulse 64, temperature 98.1 F (36.7 C), temperature source Oral, resp. rate 18, height _0  (1.651 m), weight 136 lb (61.7 kg), SpO2 97 %. Body mass index is 22.63 kg/m.  Past Psychiatric History:   Is the patient at risk to self? Yes  Has the patient been a risk to self in the past 6 months? No .    Has the patient been a risk to self within the distant past? No   Is the patient a risk to others? No   Has the patient been a risk to others in the past 6 months? No   Has the patient been a risk to others within the distant past? No   Past Medical History:  Past Medical History:  Diagnosis Date  . Allergy   . Auditory hallucination   . Bipolar disorder ([place])   . Deliberate self-cutting   . GAD (generalized anxiety disorder)   . Heart disease   . 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 Migraines   . Right ovarian cyst 02/20/2019   3.7 cm right ovarian cyst.  . Schizoaffective disorder ([place])   . Tetralogy of Fallot  Past Surgical History:  Procedure Laterality Date  . BIOPSY  03/08/2019   Procedure: BIOPSY;  Surgeon: Carol Ada, MD;  Location: [facility] ENDOSCOPY;  Service: Endoscopy;;  . CARDIAC SURGERY    . CARDIAC SURGERY     4 open heart surgeries  . ESOPHAGOGASTRODUODENOSCOPY (EGD) WITH PROPOFOL N/A 03/08/2019   Procedure: ESOPHAGOGASTRODUODENOSCOPY  (EGD) WITH PROPOFOL;  Surgeon: Carol Ada, MD;  Location: [facility] ENDOSCOPY;  Service: Endoscopy;  Laterality: N/A;  . GASTROSTOMY W/ FEEDING TUBE     removed 1 year ago   . THORACIC DUCT LIGATION      Family History:  Family History  Problem Relation Age of Onset  . Hypertension Maternal Grandmother   . Diabetes Maternal Grandfather   . Heart disease Maternal Grandfather   . Stroke Maternal Grandfather   . Hypertension Mother   . Healthy Father     Social History:  Social History   Socioeconomic History  . Marital status: Single    Spouse name: Not on file  . Number of children: 0  . Years of education: College  . Highest education level: Not on file  Occupational History  . Occupation: Ship broker  Tobacco Use  . Smoking status: Never Smoker  . Smokeless tobacco: Never Used  Vaping Use  . Vaping Use: Never used  Substance and Sexual Activity  . Alcohol use: Yes    Comment: occas  . Drug use: Not Currently    Types: Marijuana    Comment: Occas.  Hemp  . Sexual activity: Not Currently    Partners: Male  Other Topics Concern  . Not on file  Social History Narrative   Lives at home with mother.   Right-handed.   No more than 2 cups caffeine per day.      Works at [employer] Strain:   . Difficulty of Paying Living Expenses: Not on file  Food Insecurity:   . Worried About Charity fundraiser in the Last Year: Not on file  . Ran Out of Food in the Last Year: Not on file  Transportation Needs:   . Lack of Transportation (Medical): Not on file  . Lack of Transportation (Non-Medical): Not on file  Physical Activity:   . Days of Exercise per Week: Not on file  . Minutes of Exercise per Session: Not on file  Stress:   . Feeling of Stress : Not on file  Social Connections:   . Frequency of Communication with Friends and Family: Not on file  . Frequency of Social Gatherings with Friends and Family:  Not on file  . Attends Religious Services: Not on file  . Active Member of Clubs or Organizations: Not on file  . Attends Archivist Meetings: Not on file  . Marital Status: Not on file  Intimate Partner Violence:   . Fear of Current or Ex-Partner: Not on file  . Emotionally Abused: Not on file  . Physically Abused: Not on file  . Sexually Abused: Not on file    SDOH:  SDOH Screenings   Alcohol Screen:   . Last Alcohol Screening Score (AUDIT): Not on file  Depression (PHQ2-9): Medium Risk  . PHQ-2 Score: 18  Financial Resource Strain:   . Difficulty of Paying Living Expenses: Not on file  Food Insecurity:   . Worried About Charity fundraiser in the Last Year: Not on file  . Ran Out of Food in the  Last Year: Not on file  Housing:   . Last Housing Risk Score: Not on file  Physical Activity:   . Days of Exercise per Week: Not on file  . Minutes of Exercise per Session: Not on file  Social Connections:   . Frequency of Communication with Friends and Family: Not on file  . Frequency of Social Gatherings with Friends and Family: Not on file  . Attends Religious Services: Not on file  . Active Member of Clubs or Organizations: Not on file  . Attends Archivist Meetings: Not on file  . Marital Status: Not on file  Stress:   . Feeling of Stress : Not on file  Tobacco Use: Low Risk   . Smoking Tobacco Use: Never Smoker  . Smokeless Tobacco Use: Never Used  Transportation Needs:   . Film/video editor (Medical): Not on file  . Lack of Transportation (Non-Medical): Not on file    Last Labs:  Office Visit on 01/10/2020  Component Date Value Ref Range Status  . WBC 01/10/2020 4.8  3.4 - 10.8 x10E3/uL Final  . RBC 01/10/2020 4.84  3.77 - 5.28 x10E6/uL Final  . Hemoglobin 01/10/2020 14.1  11.1 - 15.9 g/dL Final  . Hematocrit 01/10/2020 42.3  34.0 - 46.6 % Final  . MCV 01/10/2020 87  79 - 97 fL Final  . MCH 01/10/2020 29.1  26.6 - 33.0 pg Final  . MCHC  01/10/2020 33.3  31 - 35 g/dL Final  . RDW 01/10/2020 12.6  11.7 - 15.4 % Final  . Platelets 01/10/2020 135* 150 - 450 x10E3/uL Final  . Neutrophils 01/10/2020 65  Not Estab. % Final  . Lymphs 01/10/2020 21  Not Estab. % Final  . Monocytes 01/10/2020 11  Not Estab. % Final  . Eos 01/10/2020 2  Not Estab. % Final  . Basos 01/10/2020 1  Not Estab. % Final  . Neutrophils Absolute 01/10/2020 3.1  1 - 7 x10E3/uL Final  . Lymphocytes Absolute 01/10/2020 1.0  0 - 3 x10E3/uL Final  . Monocytes Absolute 01/10/2020 0.5  0 - 0 x10E3/uL Final  . EOS (ABSOLUTE) 01/10/2020 0.1  0.0 - 0.4 x10E3/uL Final  . Basophils Absolute 01/10/2020 0.1  0 - 0 x10E3/uL Final  . Immature Granulocytes 01/10/2020 0  Not Estab. % Final  . Immature Grans (Abs) 01/10/2020 0.0  0.0 - 0.1 x10E3/uL Final  . Glucose 01/10/2020 86  65 - 99 mg/dL Final  . BUN 01/10/2020 7  6 - 20 mg/dL Final  . Creatinine, Ser 01/10/2020 0.82  0.57 - 1.00 mg/dL Final  . GFR calc non Af Amer 01/10/2020 101  >59 mL/min/1.73 Final  . GFR calc Af Amer 01/10/2020 117  >59 mL/min/1.73 Final   Comment: **[facility] currently reports eGFR in compliance with the current**   recommendations of the [employer]. [facility] will   update reporting as new guidelines are published from the NKF-ASN   Task force.   . BUN/Creatinine Ratio 01/10/2020 9  9 - 23 Final  . Sodium 01/10/2020 137  134 - 144 mmol/L Final  . Potassium 01/10/2020 4.5  3.5 - 5.2 mmol/L Final  . Chloride 01/10/2020 103  96 - 106 mmol/L Final  . CO2 01/10/2020 20  20 - 29 mmol/L Final  . Calcium 01/10/2020 9.5  8.7 - 10.2 mg/dL Final  . Total Protein 01/10/2020 6.8  6.0 - 8.5 g/dL Final  . Albumin 01/10/2020 4.6  3.9 - 5.0 g/dL Final  . Globulin,  Total 01/10/2020 2.2  1.5 - 4.5 g/dL Final  . Albumin/Globulin Ratio 01/10/2020 2.1  1.2 - 2.2 Final  . Bilirubin Total 01/10/2020 0.2  0.0 - 1.2 mg/dL Final  . Alkaline Phosphatase 01/10/2020 72  48 - 121 IU/L Final  . AST  01/10/2020 17  0 - 40 IU/L Final  . ALT 01/10/2020 15  0 - 32 IU/L Final  . Color, UA 01/10/2020 yellow  yellow Final  . Clarity, UA 01/10/2020 clear  clear Final  . Glucose, UA 01/10/2020 negative  negative mg/dL Final  . Bilirubin, UA 01/10/2020 negative  negative Final  . Ketones, POC UA 01/10/2020 negative  negative mg/dL Final  . Specific Gravity, Urine 01/10/2020 1.015   Final  . Blood, UA 01/10/2020 negative  negative Final  . pH, UA 01/10/2020 7.5  5.0 - 8.0 Final  . Protein Ur, POC 01/10/2020 negative  negative mg/dL Final  . Urobilinogen, Ur 01/10/2020 n   Final  . Nitrite, UA 01/10/2020 Negative  Negative Final  . Leukocytes, UA 01/10/2020 Negative  Negative Final  Office Visit on 01/03/2020  Component Date Value Ref Range Status  . WBC 01/03/2020 2.9* 3.4 - 10.8 x10E3/uL Final  . RBC 01/03/2020 4.86  3.77 - 5.28 x10E6/uL Final  . Hemoglobin 01/03/2020 14.3  11.1 - 15.9 g/dL Final  . Hematocrit 01/03/2020 44.0  34.0 - 46.6 % Final  . MCV 01/03/2020 91  79 - 97 fL Final  . MCH 01/03/2020 29.4  26.6 - 33.0 pg Final  . MCHC 01/03/2020 32.5  31 - 35 g/dL Final  . RDW 01/03/2020 12.8  11.7 - 15.4 % Final  . Platelets 01/03/2020 139* 150 - 450 x10E3/uL Final  . Neutrophils 01/03/2020 56  Not Estab. % Final  . Lymphs 01/03/2020 24  Not Estab. % Final  . Monocytes 01/03/2020 17  Not Estab. % Final  . Eos 01/03/2020 1  Not Estab. % Final  . Basos 01/03/2020 2  Not Estab. % Final  . Neutrophils Absolute 01/03/2020 1.6  1 - 7 x10E3/uL Final  . Lymphocytes Absolute 01/03/2020 0.7  0 - 3 x10E3/uL Final  . Monocytes Absolute 01/03/2020 0.5  0 - 0 x10E3/uL Final  . EOS (ABSOLUTE) 01/03/2020 0.0  0.0 - 0.4 x10E3/uL Final  . Basophils Absolute 01/03/2020 0.1  0 - 0 x10E3/uL Final  . Immature Granulocytes 01/03/2020 0  Not Estab. % Final  . Immature Grans (Abs) 01/03/2020 0.0  0.0 - 0.1 x10E3/uL Final  . Glucose 01/03/2020 98  65 - 99 mg/dL Final  . BUN 01/03/2020 9  6 - 20 mg/dL Final   . Creatinine, Ser 01/03/2020 0.98  0.57 - 1.00 mg/dL Final  . GFR calc non Af Amer 01/03/2020 82  >59 mL/min/1.73 Final  . GFR calc Af Amer 01/03/2020 94  >59 mL/min/1.73 Final   Comment: **[facility] currently reports eGFR in compliance with the current**   recommendations of the [employer]. [facility] will   update reporting as new guidelines are published from the NKF-ASN   Task force.   . BUN/Creatinine Ratio 01/03/2020 9  9 - 23 Final  . Sodium 01/03/2020 140  134 - 144 mmol/L Final  . Potassium 01/03/2020 4.4  3.5 - 5.2 mmol/L Final  . Chloride 01/03/2020 104  96 - 106 mmol/L Final  . CO2 01/03/2020 17* 20 - 29 mmol/L Final  . Calcium 01/03/2020 9.9  8.7 - 10.2 mg/dL Final  . Total Protein 01/03/2020 7.5  6.0 - 8.5  g/dL Final  . Albumin 01/03/2020 4.9  3.9 - 5.0 g/dL Final  . Globulin, Total 01/03/2020 2.6  1.5 - 4.5 g/dL Final  . Albumin/Globulin Ratio 01/03/2020 1.9  1.2 - 2.2 Final  . Bilirubin Total 01/03/2020 0.2  0.0 - 1.2 mg/dL Final  . Alkaline Phosphatase 01/03/2020 67  48 - 121 IU/L Final  . AST 01/03/2020 17  0 - 40 IU/L Final  . ALT 01/03/2020 12  0 - 32 IU/L Final  Admission on 12/27/2019, Discharged on 12/27/2019  Component Date Value Ref Range Status  . SARS Coronavirus 2 12/27/2019 NEGATIVE  NEGATIVE Final   Comment: (NOTE) SARS-CoV-2 target nucleic acids are NOT DETECTED.  The SARS-CoV-2 RNA is generally detectable in upper and lower respiratory specimens during the acute phase of infection. Negative results do not preclude SARS-CoV-2 infection, do not rule out co-infections with other pathogens, and should not be used as the sole basis for treatment or other patient management decisions. Negative results must be combined with clinical observations, patient history, and epidemiological information. The expected result is Negative.  Fact Sheet for Patients: SugarRoll.be  Fact Sheet for Healthcare  Providers: https://www.woods-mathews.com/  This test is not yet approved or cleared by the [place] FDA and  has been authorized for detection and/or diagnosis of SARS-CoV-2 by FDA under an Emergency Use Authorization (EUA). This EUA will remain  in effect (meaning this test can be used) for the duration of the COVID-19 declaration under Se                          ction 564(b)(1) of the Act, 21 U.S.C. section 360bbb-3(b)(1), unless the authorization is terminated or revoked sooner.  Performed at [facility], [place] [address], [place], [place] [address]   Abstract on 12/05/2019  Component Date Value Ref Range Status  . HM Pap smear 11/28/2019 negative   Final  Office Visit on 11/29/2019  Component Date Value Ref Range Status  . Color, UA 11/29/2019 yellow  yellow Final  . Clarity, UA 11/29/2019 clear  clear Final  . Glucose, UA 11/29/2019 negative  negative mg/dL Final  . Bilirubin, UA 11/29/2019 negative  negative Final  . Ketones, POC UA 11/29/2019 negative  negative mg/dL Final  . Specific Gravity, Urine 11/29/2019 1.030   Final  . Blood, UA 11/29/2019 negative  negative Final  . pH, UA 11/29/2019 5.5  5.0 - 8.0 Final  . Protein Ur, POC 11/29/2019 negative  negative mg/dL Final  . Urobilinogen, Ur 11/29/2019 n   Final  . Nitrite, UA 11/29/2019 Negative  Negative Final  . Leukocytes, UA 11/29/2019 Negative  Negative Final  Office Visit on 11/01/2019  Component Date Value Ref Range Status  . WBC 11/01/2019 3.8  3.4 - 10.8 x10E3/uL Final  . RBC 11/01/2019 4.65  3.77 - 5.28 x10E6/uL Final  . Hemoglobin 11/01/2019 14.0  11.1 - 15.9 g/dL Final  . Hematocrit 11/01/2019 41.1  34.0 - 46.6 % Final  . MCV 11/01/2019 88  79 - 97 fL Final  . MCH 11/01/2019 30.1  26.6 - 33.0 pg Final  . MCHC 11/01/2019 34.1  31 - 35 g/dL Final  . RDW 11/01/2019 13.3  11.7 - 15.4 % Final  . Platelets 11/01/2019 149* 150 - 450 x10E3/uL Final  . Neutrophils 11/01/2019 63  Not Estab.  % Final  . Lymphs 11/01/2019 24  Not Estab. % Final  . Monocytes 11/01/2019 10  Not Estab. %  Final  . Eos 11/01/2019 2  Not Estab. % Final  . Basos 11/01/2019 1  Not Estab. % Final  . Neutrophils Absolute 11/01/2019 2.4  1 - 7 x10E3/uL Final  . Lymphocytes Absolute 11/01/2019 0.9  0 - 3 x10E3/uL Final  . Monocytes Absolute 11/01/2019 0.4  0 - 0 x10E3/uL Final  . EOS (ABSOLUTE) 11/01/2019 0.1  0.0 - 0.4 x10E3/uL Final  . Basophils Absolute 11/01/2019 0.1  0 - 0 x10E3/uL Final  . Immature Granulocytes 11/01/2019 0  Not Estab. % Final  . Immature Grans (Abs) 11/01/2019 0.0  0.0 - 0.1 x10E3/uL Final  . Glucose 11/01/2019 88  65 - 99 mg/dL Final  . BUN 11/01/2019 6  6 - 20 mg/dL Final  . Creatinine, Ser 11/01/2019 0.84  0.57 - 1.00 mg/dL Final  . GFR calc non Af Amer 11/01/2019 98  >59 mL/min/1.73 Final  . GFR calc Af Amer 11/01/2019 113  >59 mL/min/1.73 Final   Comment: **[facility] currently reports eGFR in compliance with the current**   recommendations of the [employer]. [facility] will   update reporting as new guidelines are published from the NKF-ASN   Task force.   . BUN/Creatinine Ratio 11/01/2019 7* 9 - 23 Final  . Sodium 11/01/2019 137  134 - 144 mmol/L Final  . Potassium 11/01/2019 4.4  3.5 - 5.2 mmol/L Final  . Chloride 11/01/2019 104  96 - 106 mmol/L Final  . CO2 11/01/2019 19* 20 - 29 mmol/L Final  . Calcium 11/01/2019 9.7  8.7 - 10.2 mg/dL Final  . Total Protein 11/01/2019 7.3  6.0 - 8.5 g/dL Final  . Albumin 11/01/2019 4.7  3.9 - 5.0 g/dL Final  . Globulin, Total 11/01/2019 2.6  1.5 - 4.5 g/dL Final  . Albumin/Globulin Ratio 11/01/2019 1.8  1.2 - 2.2 Final  . Bilirubin Total 11/01/2019 0.5  0.0 - 1.2 mg/dL Final  . Alkaline Phosphatase 11/01/2019 70  48 - 121 IU/L Final  . AST 11/01/2019 16  0 - 40 IU/L Final  . ALT 11/01/2019 13  0 - 32 IU/L Final  . TSH 11/01/2019 1.280  0.450 - 4.500 uIU/mL Final  . Free T4 11/01/2019 1.38  0.82 - 1.77 ng/dL Final  . T3,  Total 11/01/2019 150  71 - 180 ng/dL Final  . Vit D, 25-Hydroxy 11/01/2019 10.7* 30.0 - 100.0 ng/mL Final   Comment: Vitamin D deficiency has been defined by the Institute of Medicine and an Endocrine Society practice guideline as a level of serum 25-OH vitamin D less than 20 ng/mL (1,2). The Endocrine Society went on to further define vitamin D insufficiency as a level between 21 and 29 ng/mL (2). 1. IOM (Institute of Medicine). 2010. Dietary reference    intakes for calcium and D. [place]: The    [employer]. 2. Holick MF, Binkley Garland, Bischoff-Ferrari HA, et al.    Evaluation, treatment, and prevention of vitamin D    deficiency: an Endocrine Society clinical practice    guideline. JCEM. 2011 Jul; 96(7):1911-30.   . Vitamin B-12 11/01/2019 695  232 - 1,245 pg/mL Final  . Total Iron Binding Capacity 11/01/2019 397  250 - 450 ug/dL Final  . UIBC 11/01/2019 308  131 - 425 ug/dL Final  . Iron 11/01/2019 89  27 - 159 ug/dL Final  . Iron Saturation 11/01/2019 22  15 - 55 % Final  . Ferritin 11/01/2019 45  15.0 - 150.0 ng/mL Final  Admission on 09/25/2019, Discharged on 09/25/2019  Component Date Value Ref Range Status  . SARS Coronavirus 2 09/25/2019 NEGATIVE  NEGATIVE Final   Comment: (NOTE) SARS-CoV-2 target nucleic acids are NOT DETECTED. The SARS-CoV-2 RNA is generally detectable in upper and lower respiratory specimens during the acute phase of infection. Negative results do not preclude SARS-CoV-2 infection, do not rule out co-infections with other pathogens, and should not be used as the sole basis for treatment or other patient management decisions. Negative results must be combined with clinical observations, patient history, and epidemiological information. The expected result is Negative. Fact Sheet for Patients: SugarRoll.be Fact Sheet for Healthcare Providers: https://www.woods-mathews.com/ This test is not yet  approved or cleared by the [place] FDA and  has been authorized for detection and/or diagnosis of SARS-CoV-2 by FDA under an Emergency Use Authorization (EUA). This EUA will remain  in effect (meaning this test can be used) for the duration of the COVID-19 declaration under Section 56                          4(b)(1) of the Act, 21 U.S.C. section 360bbb-3(b)(1), unless the authorization is terminated or revoked sooner. Performed at [facility], [place] [address], [place], [place] [address]     Allergies: Dopamine and Peanut-containing drug products  PTA Medications: (Not in a hospital admission)   Medical Decision Making    Inpatient Admission recommended  -Covid- (pending)  -Restarted Lamictal 100 mg and [place]en[place]in 25m po BID   Recommendations  Based on my evaluation the patient does not appear to have an emergency medical condition.  [place] NP 02/10/20  3:20 PM

## 2020-02-10 NOTE — ED Provider Notes (Addendum)
Behavioral Health Urgent Care Medical Screening Exam  Patient Name: Alexis [name] MRN: [id number] Date of Evaluation: 02/10/20 Chief Complaint:   Diagnosis:  Final diagnoses:  None    History of Present illness: [name] Lablanc is a 24 y.o. female presents to [facility] accompanied with her mother.  Alexis cites suicidal ideation with plan and intent.  She appears flat, guarded but pleasant.  Reports auditory and visual hallucinations.  States she is followed by psychiatry and therapy which she is prescribed Lamictal, Saphris and Vistaril states she has been taken and tolerating medications well. Patient stated that I don't think that the medication is helping.  Patient denies previous inpatient admissions.  Denies history of alcohol or illicit drug use.  Reported history with suicide attempt by hanging few years prior.  Recommended for inpatient admission.  Support, encouragement and reassurance was provided.  Psychiatric Specialty Exam  Presentation  General Appearance:Appropriate for Environment  Eye Contact:Good  Speech:Clear and Coherent  Speech Volume:Normal  Handedness:No data recorded  Mood and Affect  Mood:Anxious;Depressed  Affect:Appropriate   Thought Process  Thought Processes:Coherent  Descriptions of Associations:Intact  Orientation:Full (Time, Place and Person)  Thought Content:Logical  Hallucinations:Auditory  Ideas of Reference:Paranoia  Suicidal Thoughts:Yes, Passive With Plan  Homicidal Thoughts:No   Sensorium  Memory:Immediate Good;Recent Good;Remote Good  Judgment:Fair  Insight:Fair   Executive Functions  Concentration:Fair  Attention Span:Good  Recall:Good  Fund of Knowledge:Fair  Language:Fair   Psychomotor Activity  Psychomotor Activity:Normal   Assets  Assets:Communication Skills;Social Support   Sleep  Sleep:Fair  Number of hours: 5   Physical Exam: Physical Exam ROS Blood  pressure 120/69, pulse 64, temperature 98.1 F (36.7 C), temperature source Oral, resp. rate 18, height 5\' 5"  (1.651 m), weight 136 lb (61.7 kg), SpO2 97 %. Body mass index is 22.63 kg/m.  Musculoskeletal: Strength & Muscle Tone: within normal limits Gait & Station: normal Patient leans: N/A   [facility] MSE Discharge Disposition for Follow up and Recommendations: Based on my evaluation I certify that psychiatric inpatient services furnished can reasonably be expected to improve the patient's condition which I recommend transfer to an appropriate accepting facility.   -We will restart medications where appropriate   , NP 02/10/2020, 3:05 PM
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
## 2020-02-11 ENCOUNTER — Other Ambulatory Visit: Payer: Self-pay

## 2020-02-11 ENCOUNTER — Encounter (HOSPITAL_COMMUNITY): Payer: Self-pay | Admitting: Psychiatry

## 2020-02-11 ENCOUNTER — Inpatient Hospital Stay (HOSPITAL_COMMUNITY)
Admission: AD | Admit: 2020-02-11 | Discharge: 2020-02-15 | DRG: 885 | Disposition: A | Discharge status: Home / Self Care | Payer: Federal, State, Local not specified - PPO | Source: Intra-hospital | Attending: Psychiatry | Admitting: Psychiatry

## 2020-02-11 DIAGNOSIS — Z91410 Personal history of adult physical and sexual abuse: Secondary | ICD-10-CM

## 2020-02-11 DIAGNOSIS — Z8249 Family history of ischemic heart disease and other diseases of the circulatory system: Secondary | ICD-10-CM

## 2020-02-11 DIAGNOSIS — Z888 Allergy status to other drugs, medicaments and biological substances status: Secondary | ICD-10-CM

## 2020-02-11 DIAGNOSIS — Z823 Family history of stroke: Secondary | ICD-10-CM

## 2020-02-11 DIAGNOSIS — Z818 Family history of other mental and behavioral disorders: Secondary | ICD-10-CM | POA: Diagnosis not present

## 2020-02-11 DIAGNOSIS — R519 Headache, unspecified: Secondary | ICD-10-CM | POA: Diagnosis present

## 2020-02-11 DIAGNOSIS — R7303 Prediabetes: Secondary | ICD-10-CM | POA: Diagnosis present

## 2020-02-11 DIAGNOSIS — Z62811 Personal history of psychological abuse in childhood: Secondary | ICD-10-CM | POA: Diagnosis present

## 2020-02-11 DIAGNOSIS — Z91018 Allergy to other foods: Secondary | ICD-10-CM | POA: Diagnosis not present

## 2020-02-11 DIAGNOSIS — I951 Orthostatic hypotension: Secondary | ICD-10-CM | POA: Diagnosis not present

## 2020-02-11 DIAGNOSIS — F4310 Post-traumatic stress disorder, unspecified: Secondary | ICD-10-CM | POA: Diagnosis present

## 2020-02-11 DIAGNOSIS — R45851 Suicidal ideations: Secondary | ICD-10-CM | POA: Diagnosis not present

## 2020-02-11 DIAGNOSIS — T446X5A Adverse effect of alpha-adrenoreceptor antagonists, initial encounter: Secondary | ICD-10-CM | POA: Diagnosis not present

## 2020-02-11 DIAGNOSIS — G4700 Insomnia, unspecified: Secondary | ICD-10-CM | POA: Diagnosis present

## 2020-02-11 DIAGNOSIS — F411 Generalized anxiety disorder: Secondary | ICD-10-CM | POA: Diagnosis present

## 2020-02-11 DIAGNOSIS — Z9151 Personal history of suicidal behavior: Secondary | ICD-10-CM | POA: Diagnosis present

## 2020-02-11 DIAGNOSIS — Z91010 Allergy to peanuts: Secondary | ICD-10-CM

## 2020-02-11 DIAGNOSIS — Y92239 Unspecified place in hospital as the place of occurrence of the external cause: Secondary | ICD-10-CM | POA: Diagnosis not present

## 2020-02-11 DIAGNOSIS — F250 Schizoaffective disorder, bipolar type: Secondary | ICD-10-CM | POA: Diagnosis not present

## 2020-02-11 DIAGNOSIS — F319 Bipolar disorder, unspecified: Secondary | ICD-10-CM | POA: Diagnosis not present

## 2020-02-11 DIAGNOSIS — Z833 Family history of diabetes mellitus: Secondary | ICD-10-CM

## 2020-02-11 DIAGNOSIS — F259 Schizoaffective disorder, unspecified: Principal | ICD-10-CM | POA: Diagnosis present

## 2020-02-11 MED ORDER — PALIPERIDONE ER 3 MG PO TB24
3.0000 mg | ORAL_TABLET | Freq: Every day | ORAL | Status: DC
  Administered 2020-02-11: 3 mg via ORAL
  Filled 2020-02-11: qty 1

## 2020-02-11 MED ORDER — ALUM & MAG HYDROXIDE-SIMETH 200-200-20 MG/5ML PO SUSP: 30.0000 mL | ORAL | Status: DC | PRN

## 2020-02-11 MED ORDER — HYDROXYZINE HCL 25 MG PO TABS
25.0000 mg | ORAL_TABLET | Freq: Three times a day (TID) | ORAL | Status: DC | PRN
  Administered 2020-02-11 – 2020-02-14 (×5): 25 mg via ORAL
  Filled 2020-02-11 (×5): qty 1

## 2020-02-11 MED ORDER — PALIPERIDONE ER 3 MG PO TB24: 3.0000 mg | ORAL_TABLET | Freq: Every day | ORAL | Status: DC

## 2020-02-11 MED ORDER — PALIPERIDONE ER 3 MG PO TB24
3.0000 mg | ORAL_TABLET | Freq: Every day | ORAL | Status: DC
  Administered 2020-02-12: 3 mg via ORAL
  Filled 2020-02-11 (×2): qty 1

## 2020-02-11 MED ORDER — TRAZODONE HCL 50 MG PO TABS: 50.0000 mg | ORAL_TABLET | Freq: Every evening | ORAL | Status: DC | PRN

## 2020-02-11 MED ORDER — ACETAMINOPHEN 325 MG PO TABS
650.0000 mg | ORAL_TABLET | Freq: Four times a day (QID) | ORAL | Status: DC | PRN
  Administered 2020-02-12: 650 mg via ORAL
  Filled 2020-02-11: qty 2

## 2020-02-11 MED ORDER — TEMAZEPAM 15 MG PO CAPS
30.0000 mg | ORAL_CAPSULE | Freq: Once | ORAL | Status: AC
  Administered 2020-02-11: 30 mg via ORAL
  Filled 2020-02-11: qty 2

## 2020-02-11 MED ORDER — LAMOTRIGINE 100 MG PO TABS
100.0000 mg | ORAL_TABLET | Freq: Every day | ORAL | Status: DC
  Administered 2020-02-12 – 2020-02-15 (×4): 100 mg via ORAL
  Filled 2020-02-11 (×5): qty 1

## 2020-02-11 MED ORDER — MAGNESIUM HYDROXIDE 400 MG/5ML PO SUSP: 30.0000 mL | Freq: Every day | ORAL | Status: DC | PRN

## 2020-02-11 NOTE — Tx Team (Signed)
Initial Treatment Plan 02/11/2020 7:52 PM [name] Lenig [id number]    PATIENT STRESSORS: Occupational concerns Substance abuse   PATIENT STRENGTHS: Ability for insight Communication skills Supportive family/friends   PATIENT IDENTIFIED PROBLEMS: Suicidal Ideation  Depression  Anxiety                 DISCHARGE CRITERIA:  Ability to meet basic life and health needs Improved stabilization in mood, thinking, and/or behavior Verbal commitment to aftercare and medication compliance  PRELIMINARY DISCHARGE PLAN: Outpatient therapy Return to previous living arrangement Return to previous work or school arrangements  PATIENT/FAMILY INVOLVEMENT: This treatment plan has been presented to and reviewed with the patient, Alexis [name].  The patient has been given the opportunity to ask questions and make suggestions.  Garnette Scheuermann, RN 02/11/2020, 7:52 PM

## 2020-02-11 NOTE — Progress Notes (Signed)
Adult Psychoeducational Group Note  Date:  02/11/2020 Time:  11:30 PM  Group Topic/Focus:  Wrap-Up Group:   The focus of this group is to help patients review their daily goal of treatment and discuss progress on daily workbooks.  Participation Level:  Active  Participation Quality:  Appropriate  Affect:  Appropriate  Cognitive:  Alert  Insight: Appropriate and Improving  Engagement in Group:  Developing/Improving  Modes of Intervention:  Education and Support  Additional Comments:   patient played an active role in wrapped up group meeting  Judithann Sheen 02/11/2020, 11:30 PM

## 2020-02-11 NOTE — ED Provider Notes (Signed)
FBC/OBS ASAP Discharge Summary  Date and Time: 02/11/2020 2:21 PM  Name: Alexis [name]  MRN:  [id number]   Discharge Diagnoses:  Final diagnoses:  Schizoaffective disorder, unspecified type [facility](HCC)    Subjective: Patient reports today that she is still feeling very depressed and having suicidal thoughts and does not feel safe going home.  She states that her hallucinations have gotten worse recently.  She stated about a month ago she was started on Saphris and was titrated up on it.  She states prior to that she was on Vraylar, Seroquel, Risperdal, Geodon, Latuda, lithium, and Zyprexa.  Patient reports that she feels that there are other medications that she has been on.  She states that some of those were beneficial to her and son caused issues.  Patient reports that the Risperdal worked for her but she had galactorrhea, Zyprexa she had a 30 pound weight gain in 1 month, Latuda worked well for her but was not affordable, and she was on high doses of Seroquel in the past but does not feel that it really helped her any.  Patient states that she was on high doses of lithium but never reached anywhere close to a therapeutic level although she endorsed taking her medications as prescribed.  Patient continues to state that she does not feel safe going home and feels that she needs to be admitted inpatient to have her medications adjusted.  After conversation about medications offered patient to start on Invega with the potential to go to [place] if tolerated well.  Patient stated that she liked the idea of a once a month injection and that she was in agreement with Invega 3 mg p.o. daily.  Stay Summary: Patient is a 24 year old female presented voluntarily to the [facility] as a walk-in accompanied by her mother.  Patient presented reporting depression and suicidal ideations.  She also reports that she has a history of schizoaffective disorder.  Patient was restarted on her home meds and after discussion  today patient continued to endorse not feeling safe going home and having auditory hallucinations.  Patient has been pleasant, calm, cooperative while on the unit and has been compliant with his medications.  After discussion of medications she agreed to start Invega 3 mg p.o. daily and continue her Lamictal.  Due to patient concern for safety as well as continued auditory hallucinations will seek inpatient placement.  [facility].  Patient's COVID test was negative.  Patient is a hard stick for drawing blood work.  [facility] has been notified about test and has agreed to allow lab to attempt to get blood work when patient arrives there.  Total Time spent with patient: 30 minutes  Past Psychiatric History: GAD, depression, Schizoaffective disorder,  Past Medical History:  Past Medical History:  Diagnosis Date  . Allergy   . Auditory hallucination   . Bipolar disorder (HCC)   . Deliberate self-cutting   . GAD (generalized anxiety disorder)   . Heart disease   . Incomplete RBBB 01/2019   [facility]  . Migraines   . Right ovarian cyst 02/20/2019   3.7 cm right ovarian cyst.  . Schizoaffective disorder (HCC)   . Tetralogy of Fallot     Past Surgical History:  Procedure Laterality Date  . BIOPSY  03/08/2019   Procedure: BIOPSY;  Surgeon: Jeani HawkingHung, Patrick, MD;  Location: [facility] ENDOSCOPY;  Service: Endoscopy;;  . CARDIAC SURGERY    . CARDIAC  SURGERY     4 open heart surgeries  . ESOPHAGOGASTRODUODENOSCOPY (EGD) WITH PROPOFOL N/A 03/08/2019   Procedure: ESOPHAGOGASTRODUODENOSCOPY (EGD) WITH PROPOFOL;  Surgeon: Jeani Hawking, MD;  Location: [facility] ENDOSCOPY;  Service: Endoscopy;  Laterality: N/A;  . GASTROSTOMY W/ FEEDING TUBE     removed 1 year ago   . THORACIC DUCT LIGATION     Family History:  Family History  Problem Relation Age of Onset  . Hypertension Maternal Grandmother   . Diabetes Maternal Grandfather   . Heart disease Maternal Grandfather   . Stroke  Maternal Grandfather   . Hypertension Mother   . Healthy Father    Family Psychiatric History: None reported  Social History:  Social History   Substance and Sexual Activity  Alcohol Use Yes   Comment: occas     Social History   Substance and Sexual Activity  Drug Use Not Currently  . Types: Marijuana   Comment: Occas.  Hemp    Social History   Socioeconomic History  . Marital status: Single    Spouse name: Not on file  . Number of children: 0  . Years of education: College  . Highest education level: Not on file  Occupational History  . Occupation: Consulting civil engineer  Tobacco Use  . Smoking status: Never Smoker  . Smokeless tobacco: Never Used  Vaping Use  . Vaping Use: Never used  Substance and Sexual Activity  . Alcohol use: Yes    Comment: occas  . Drug use: Not Currently    Types: Marijuana    Comment: Occas.  Hemp  . Sexual activity: Not Currently    Partners: Male  Other Topics Concern  . Not on file  Social History Narrative   Lives at home with mother.   Right-handed.   No more than 2 cups caffeine per day.      Works at [employer]   Social Determinants of Health   Financial Resource Strain:   . Difficulty of Paying Living Expenses: Not on file  Food Insecurity:   . Worried About Programme researcher, broadcasting/film/video in the Last Year: Not on file  . Ran Out of Food in the Last Year: Not on file  Transportation Needs:   . Lack of Transportation (Medical): Not on file  . Lack of Transportation (Non-Medical): Not on file  Physical Activity:   . Days of Exercise per Week: Not on file  . Minutes of Exercise per Session: Not on file  Stress:   . Feeling of Stress : Not on file  Social Connections:   . Frequency of Communication with Friends and Family: Not on file  . Frequency of Social Gatherings with Friends and Family: Not on file  . Attends Religious Services: Not on file  . Active Member of Clubs or Organizations: Not on file  . Attends Tax inspector Meetings: Not on file  . Marital Status: Not on file   SDOH:  SDOH Screenings   Alcohol Screen:   . Last Alcohol Screening Score (AUDIT): Not on file  Depression (PHQ2-9): Medium Risk  . PHQ-2 Score: 18  Financial Resource Strain:   . Difficulty of Paying Living Expenses: Not on file  Food Insecurity:   . Worried About Programme researcher, broadcasting/film/video in the Last Year: Not on file  . Ran Out of Food in the Last Year: Not on file  Housing:   . Last Housing Risk Score: Not on file  Physical Activity:   . Days  of Exercise per Week: Not on file  . Minutes of Exercise per Session: Not on file  Social Connections:   . Frequency of Communication with Friends and Family: Not on file  . Frequency of Social Gatherings with Friends and Family: Not on file  . Attends Religious Services: Not on file  . Active Member of Clubs or Organizations: Not on file  . Attends Banker Meetings: Not on file  . Marital Status: Not on file  Stress:   . Feeling of Stress : Not on file  Tobacco Use: Low Risk   . Smoking Tobacco Use: Never Smoker  . Smokeless Tobacco Use: Never Used  Transportation Needs:   . Freight forwarder (Medical): Not on file  . Lack of Transportation (Non-Medical): Not on file    Has this patient used any form of tobacco in the last 30 days? (Cigarettes, Smokeless Tobacco, Cigars, and/or Pipes) A prescription for an FDA-approved tobacco cessation medication was offered at discharge and the patient refused  Current Medications:  Current Facility-Administered Medications  Medication Dose Route Frequency Provider Last Rate Last Admin  . acetaminophen (TYLENOL) tablet 650 mg  650 mg Oral Q6H PRN Oneta Rack, NP      . alum & mag hydroxide-simeth (MAALOX/MYLANTA) 200-200-20 MG/5ML suspension 30 mL  30 mL Oral Q4H PRN Oneta Rack, NP      . magnesium hydroxide (MILK OF MAGNESIA) suspension 30 mL  30 mL Oral Daily PRN Oneta Rack, NP      . paliperidone  (INVEGA) 24 hr tablet 3 mg  3 mg Oral Daily Aubery Date B, FNP   3 mg at 02/11/20 1028  . traZODone (DESYREL) tablet 50 mg  50 mg Oral QHS PRN Oneta Rack, NP       Current Outpatient Medications  Medication Sig Dispense Refill  . Asenapine Maleate 10 MG SUBL PLACE 1 TABLET (10 MG TOTAL) UNDER THE TONGUE IN THE MORNING AND AT BEDTIME. (Patient taking differently: Place 10 mg under the tongue 2 (two) times daily at 8 am and 10 pm. ) 180 tablet 0  . hydrOXYzine (ATARAX/VISTARIL) 10 MG tablet Take 1-2 tablets (10-20 mg total) by mouth every 8 (eight) hours as needed. (Patient taking differently: Take 10-20 mg by mouth every 8 (eight) hours as needed for anxiety. ) 60 tablet 1  . lamoTRIgine (LAMICTAL) 100 MG tablet TAKE 1 TABLET BY MOUTH EVERY DAY (Patient taking differently: Take 100 mg by mouth daily. ) 90 tablet 0  . Norethin-Eth Estrad-Fe Biphas (LO LOESTRIN FE PO) Take 1 tablet by mouth daily.     Marland Kitchen EPINEPHrine 0.3 mg/0.3 mL IJ SOAJ injection Inject 0.3 mLs (0.3 mg total) into the muscle as needed for anaphylaxis. (Patient taking differently: Inject 0.3 mg into the muscle as needed for anaphylaxis. ) 1 each 1  . Vitamin D, Ergocalciferol, (DRISDOL) 1.25 MG (50000 UNIT) CAPS capsule Take 1 capsule (50,000 Units total) by mouth every 7 (seven) days. (Patient not taking: Reported on 02/11/2020) 12 capsule 0    PTA Medications: (Not in a hospital admission)   Musculoskeletal  Strength & Muscle Tone: within normal limits Gait & Station: normal Patient leans: N/A  Psychiatric Specialty Exam  Presentation  General Appearance: Appropriate for Environment;Casual  Eye Contact:Good  Speech:Clear and Coherent;Normal Rate  Speech Volume:Normal  Handedness:Right   Mood and Affect  Mood:Anxious;Depressed  Affect:Appropriate;Depressed;Flat   Thought Process  Thought Processes:Coherent  Descriptions of Associations:Intact  Orientation:Full (Time, Place and  Person)  Thought  Content:WDL  Hallucinations:Hallucinations: Auditory  Ideas of Reference:None  Suicidal Thoughts:Suicidal Thoughts: Yes, Passive SI Passive Intent and/or Plan: With Plan  Homicidal Thoughts:Homicidal Thoughts: No   Sensorium  Memory:Immediate Good;Recent Good;Remote Good  Judgment:Fair  Insight:Good   Executive Functions  Concentration:Good  Attention Span:Good  Recall:Good  Fund of Knowledge:Good  Language:Good   Psychomotor Activity  Psychomotor Activity:Psychomotor Activity: Normal   Assets  Assets:Communication Skills;Desire for Improvement;Financial Resources/Insurance;Housing;Social Support;Transportation   Sleep  Sleep:Sleep: Fair Number of Hours of Sleep: 5   Physical Exam  Physical Exam Vitals and nursing note reviewed.  Constitutional:      Appearance: She is well-developed.  HENT:     Head: Normocephalic.  Cardiovascular:     Rate and Rhythm: Normal rate.  Pulmonary:     Effort: Pulmonary effort is normal.  Musculoskeletal:        General: Normal range of motion.  Neurological:     Mental Status: She is alert and oriented to person, place, and time.    Review of Systems  Constitutional: Negative.   HENT: Negative.   Eyes: Negative.   Respiratory: Negative.   Cardiovascular: Negative.   Gastrointestinal: Negative.   Genitourinary: Negative.   Musculoskeletal: Negative.   Skin: Negative.   Neurological: Negative.   Endo/Heme/Allergies: Negative.   Psychiatric/Behavioral: Positive for depression, hallucinations and suicidal ideas. The patient is nervous/anxious and has insomnia.    Blood pressure 115/83, pulse 66, temperature 97.9 F (36.6 C), temperature source Oral, resp. rate 18, height 5\' 5"  (1.651 m), weight 136 lb (61.7 kg), SpO2 100 %. Body mass index is 22.63 kg/m.   Disposition: Discharge to [facility] for inpatient admission  [facility], FNP 02/11/2020, 2:21 PM

## 2020-02-11 NOTE — Discharge Instructions (Addendum)
Discharge to [facility] 

## 2020-02-11 NOTE — Progress Notes (Signed)
   02/11/20 2121  COVID-19 Daily Checkoff  Have you had a fever (temp > 37.80C/100F)  in the past 24 hours?  No  COVID-19 EXPOSURE  Have you traveled outside the state in the past 14 days? No  Have you been in contact with someone with a confirmed diagnosis of COVID-19 or PUI in the past 14 days without wearing appropriate PPE? No  Have you been living in the same home as a person with confirmed diagnosis of COVID-19 or a PUI (household contact)? No  Have you been diagnosed with COVID-19? No

## 2020-02-11 NOTE — ED Notes (Signed)
Pt sleeping in no acute distress. Safety maintained. 

## 2020-02-11 NOTE — ED Notes (Signed)
Pt outside with staff

## 2020-02-11 NOTE — ED Notes (Signed)
Pt called Will, RN, with report on pt's acceptance to [facility]. Safe transport called to transport pt to [facility].

## 2020-02-11 NOTE — ED Notes (Signed)
Patient A&O x 4, ambulatory. Patient transferred to [facility]. Patient continue to endorse SI,no plan and A/VH (voices telling her to cut herself and seeing the [employer]) upon discharge. Patient consented voluntarily to [facility]. Pt belongings returned to patient from locker #17 intact. Patient escorted to lobby via staff for transport via safe transport services to destination. Safety maintained.

## 2020-02-11 NOTE — ED Notes (Signed)
Patient discharged.

## 2020-02-11 NOTE — Progress Notes (Signed)
Pt voluntarily admitted to room 301-1.  Pt presented at [facility] on 9/26 for suicidal ideation with thoughts of hanging herself.  Pt said she tried to hang herself in a suicide attempt 3 years ago. Pt also said she has a history of cutting since she was 24 years old.  Pt endorses auditory and visual hallucinations.  Pt said she has visual hallucinations of the [employer] and that she hears voices telling pt to harm herself.   Pt has support from mom and grandma.  Pt said her father is not as supportive and says that pt has mental health issues "because he says I don't have faith in God and that I'm not praying enough."   Pt said she has diagnoses of schizoaffective disorder, bipolar disorder and anxiety and MDD.  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    Pt signed consent forms.  Pt in agreement with plan of care.  RN initiated q 15 min safety checks.

## 2020-02-11 NOTE — Progress Notes (Signed)
Skin Assessment:  Patient stated she had heart surgery when she was 74 hours old, 75 months old, [age] yr old, and 23 yrs old.  Healed scars on abdomen and chest for heart surgeries as a child.  Long scar down the middle of her chest, small feeding tube scar on L abdomen.   Old scar on R shoulder.  R wrist scar from heart surgery.  Also on R wrist healed cutting scar from 2 yrs ago.

## 2020-02-11 NOTE — ED Notes (Signed)
Pt calm, cooperative with staff. Denies SI at current. Pt continue to endorse AH stating, "cut yourself" and VH of the [employer]. Pt states, "I'm just confused right now and don't know which way to turn. My medications aren't working and I feel like I'm a burden to my family". Support given. Distraction techniques discussed. Informed pt to notify staff with any concerns or needs. Safety maintained.

## 2020-02-11 NOTE — Progress Notes (Signed)
Pt endorses AVH of the grim reaper. She said that the grim reaper tells her to hurt herself, kill herself, and how worthless she is. She denies SI/HI and verbally agrees to approach staff immediately for any thoughts of hurting herself or anyone else. Pt said that she has a psychiatrist that she sees, but has never sought inpatient hospitalization before. 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 Pt encouraged to seek other job opportunities. She said that she's supposed to have an interview on Friday. She recently graduated with a degree in [place]. She also said that she's supposed to have another surgery for her Tetralogy of Fallot which is usually done between the ages of 18-25 years, which is still yet to be scheduled. Active listening, reassurance, and support provided. Medications administered as ordered by MD. Q 15 min safety checks continue. Pt's safety has been maintained.    02/11/20 2121  Psych Admission Type (Psych Patients Only)  Admission Status Voluntary  Psychosocial Assessment  Patient Complaints Anxiety;Depression;Sadness;Nervousness  Eye Contact Fair  Facial Expression Flat  Affect Appropriate to circumstance  Speech Soft  Interaction Assertive  Motor Activity Other (Comment) (WNL)  Appearance/Hygiene Unremarkable  Behavior Characteristics Cooperative;Appropriate to situation;Anxious;Calm  Mood Depressed;Anxious;Sad;Pleasant  Thought Process  Coherency WDL  Content WDL  Delusions None reported or observed  Perception Hallucinations  Hallucination Auditory;Visual  Judgment Poor  Confusion None  Danger to Self  Current suicidal ideation? Denies  Danger to Others  Danger to Others None reported or  observed
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
## 2020-02-12 DIAGNOSIS — F319 Bipolar disorder, unspecified: Secondary | ICD-10-CM

## 2020-02-12 DIAGNOSIS — R45851 Suicidal ideations: Secondary | ICD-10-CM | POA: Insufficient documentation

## 2020-02-12 HISTORY — DX: Suicidal ideations: R45.851

## 2020-02-12 LAB — CBC
HCT: 46.6 % — ABNORMAL HIGH (ref 36.0–46.0)
Hemoglobin: 14.9 g/dL (ref 12.0–15.0)
MCH: 29.7 pg (ref 26.0–34.0)
MCHC: 32 g/dL (ref 30.0–36.0)
MCV: 92.8 fL (ref 80.0–100.0)
Platelets: 135 10*3/uL — ABNORMAL LOW (ref 150–400)
RBC: 5.02 MIL/uL (ref 3.87–5.11)
RDW: 12.5 % (ref 11.5–15.5)
WBC: 3.8 10*3/uL — ABNORMAL LOW (ref 4.0–10.5)
nRBC: 0 % (ref 0.0–0.2)

## 2020-02-12 LAB — COMPREHENSIVE METABOLIC PANEL
ALT: 16 U/L (ref 0–44)
AST: 19 U/L (ref 15–41)
Albumin: 4.6 g/dL (ref 3.5–5.0)
Alkaline Phosphatase: 62 U/L (ref 38–126)
Anion gap: 13 (ref 5–15)
BUN: 17 mg/dL (ref 6–20)
CO2: 21 mmol/L — ABNORMAL LOW (ref 22–32)
Calcium: 9.6 mg/dL (ref 8.9–10.3)
Chloride: 105 mmol/L (ref 98–111)
Creatinine, Ser: 0.92 mg/dL (ref 0.44–1.00)
GFR calc Af Amer: >60 mL/min (ref >60)
GFR calc non Af Amer: >60 mL/min (ref >60)
Glucose, Bld: 66 mg/dL — ABNORMAL LOW (ref 70–99)
Potassium: 4 mmol/L (ref 3.5–5.1)
Sodium: 139 mmol/L (ref 135–145)
Total Bilirubin: 0.3 mg/dL (ref 0.3–1.2)
Total Protein: 7.8 g/dL (ref 6.5–8.1)

## 2020-02-12 LAB — LIPID PANEL
Cholesterol: 158 mg/dL (ref 0–200)
HDL: 58 mg/dL (ref >40)
LDL Cholesterol: 82 mg/dL (ref 0–99)
Total CHOL/HDL Ratio: 2.7 RATIO
Triglycerides: 89 mg/dL (ref <150)
VLDL: 18 mg/dL (ref 0–40)

## 2020-02-12 LAB — TSH: TSH: 2.579 u[IU]/mL (ref 0.350–4.500)

## 2020-02-12 LAB — HEMOGLOBIN A1C
Hgb A1c MFr Bld: 5.8 % — ABNORMAL HIGH (ref 4.8–5.6)
Mean Plasma Glucose: 119.76 mg/dL

## 2020-02-12 MED ORDER — PALIPERIDONE ER 6 MG PO TB24
6.0000 mg | ORAL_TABLET | Freq: Every day | ORAL | Status: DC
  Administered 2020-02-12: 6 mg via ORAL
  Filled 2020-02-12 (×2): qty 1

## 2020-02-12 MED ORDER — PRAZOSIN HCL 1 MG PO CAPS
1.0000 mg | ORAL_CAPSULE | Freq: Every day | ORAL | Status: DC
  Administered 2020-02-12: 1 mg via ORAL
  Filled 2020-02-12 (×3): qty 1

## 2020-02-12 MED ORDER — LAMOTRIGINE 25 MG PO TABS
25.0000 mg | ORAL_TABLET | Freq: Every evening | ORAL | Status: DC
  Administered 2020-02-12 – 2020-02-14 (×3): 25 mg via ORAL
  Filled 2020-02-12 (×4): qty 1
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
## 2020-02-12 NOTE — Progress Notes (Signed)
   02/12/20 0700  Vital Signs  Temp 97.7 F (36.5 C)  Temp Source Oral  Pulse Rate 84  Pulse Rate Source Dinamap  Resp 16  BP 106/89  BP Method Automatic  Patient Position (if appropriate) Sitting   D: Patient denies SI/HI. Patient reports hearing voices and seeing the "Lonell Grandchild" and seeing her ex boyfirend.  Pt. Reported head ache pain, but did not want tylenol. Pt. Rated both anxiety and depression 5/10. Pt. Was out in open areas. A:  Patient took scheduled medicine. Pt.was given vistaril for  anxiety.   Support and encouragement provided Routine safety checks conducted every 15 minutes. Patient  Informed to notify staff with any concerns.   R:  Safety maintained.
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
## 2020-02-12 NOTE — Progress Notes (Signed)
Recreation Therapy Notes  Animal-Assisted Activity (AAA) Program Checklist/Progress Notes Patient Eligibility Criteria Checklist & Daily Group note for Rec Tx Intervention  Date: 9.28.21 Time: 1430 Location: [address]   AAA/T Program Assumption of Risk Form signed by Engineer, production or Parent Legal Guardian  YES   Patient is free of allergies or sever asthma  YES   Patient reports no fear of animals  YES   Patient reports no history of cruelty to animals YES  Patient understands his/her participation is voluntary YES  Patient washes hands before animal contact  YES   Patient washes hands after animal contact  YES   Behavioral Response: Engaged  Education: Charity fundraiser, Appropriate Animal Interaction   Education Outcome: Acknowledges understanding/In group clarification offered/Needs additional education.   Clinical Observations/Feedback: Pt attended and participated in group activity.    Caroll Rancher, LRT/CTRS        Caroll Rancher A 02/12/2020 3:35 PM

## 2020-02-13 LAB — GLUCOSE, CAPILLARY: Glucose-Capillary: 110 mg/dL — ABNORMAL HIGH (ref 70–99)

## 2020-02-13 MED ORDER — PALIPERIDONE ER 3 MG PO TB24
3.0000 mg | ORAL_TABLET | Freq: Every day | ORAL | Status: DC
  Administered 2020-02-13 – 2020-02-14 (×2): 3 mg via ORAL
  Filled 2020-02-13 (×3): qty 1
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
## 2020-02-13 NOTE — [facility] Suicide Risk Assessment (Signed)
[facility] INPATIENT:  Family/Significant Other Suicide Prevention Education  Suicide Prevention Education:  Education Completed; Sonji Mock [phone] (Mother) has been identified by the patient as the family member/significant other with whom the patient will be residing, and identified as the person(s) who will aid the patient in the event of a mental health crisis (suicidal ideations/suicide attempt).  With written consent from the patient, the family member/significant other has been provided the following suicide prevention education, prior to the and/or following the discharge of the patient.  The suicide prevention education provided includes the following:  Suicide risk factors  Suicide prevention and interventions  National Suicide Hotline telephone number  [facility] assessment telephone number  [facility] Emergency Assistance 911  [facility] and/or Residential Mobile Crisis Unit telephone number  Request made of family/significant other to:  Remove weapons (e.g., guns, rifles, knives), all items previously/currently identified as safety concern.    Remove drugs/medications (over-the-counter, prescriptions, illicit drugs), all items previously/currently identified as a safety concern.  The family member/significant other verbalizes understanding of the suicide prevention education information provided.  The family member/significant other agrees to remove the items of safety concern listed above.   Mrs. Mock states that she usually knows when her [name] is depressed and that her [name] usually tells her about what she is thinking and feelings.  Mrs. Mock states that this is the first time her [name] has attempted suicide and she was not aware that her [name] was depressed at that time.  Mrs. Mock states that when [name] was first diagnosed with Schizoaffective Disorder she hid all the knifes in the kitchen and began watching [name] more carefully.  Mrs. Mock  states that there is a firearm in the home but it is in a safe and [name] does not know where it is at or that it is still in the home.  Mrs. Mock states that there is always someone home to watch [name].  Mrs. Mock will be picking [name] up at discharge and [name] will be returning home with her mother.    Aram Beecham 02/13/2020, 3:39 PM
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
## 2020-02-13 NOTE — Progress Notes (Signed)
   02/12/20 2134  COVID-19 Daily Checkoff  Have you had a fever (temp > 37.80C/100F)  in the past 24 hours?  No  COVID-19 EXPOSURE  Have you traveled outside the state in the past 14 days? No  Have you been in contact with someone with a confirmed diagnosis of COVID-19 or PUI in the past 14 days without wearing appropriate PPE? No  Have you been living in the same home as a person with confirmed diagnosis of COVID-19 or a PUI (household contact)? No  Have you been diagnosed with COVID-19? No

## 2020-02-13 NOTE — Progress Notes (Signed)
Pt c/o feeling dizzy and lightheaded. Pt's vital signs were assessed. At 0617, blood pressure was 58/37 sitting with a pulse of 77. Once pt was stood up for a standing blood pressure, she started leaning forward and backward. Pt was assisted to a sitting position. Provided 2-240 mLs of Gatorade which pt consumed. Blood pressure was then reassessed at 0624, it was 112/68 sitting with a pulse of 90. Standing it was 88/56 with a pulse of 91. Pt denied feeling dizzy or lightheaded once she was assisted to a standing position this time. Pt provided another 240 mLs of Gatorade which she consumed. Pt was assisted back to her room by 2 staff members. Denied any dizzy or lightheadedness. Placed on high fall risk protocol. Educated to change positions slowly, dangle on the side of the bed for a minute or two before standing, and notifiying staff immediately of any dizziness or lightheadedness. Pt demonstrated understanding. Q 15 min safety checks continue. Pt's safety has been maintained. NP, Nira Conn notified.
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
## 2020-02-13 NOTE — Tx Team (Signed)
Interdisciplinary Treatment and Diagnostic Plan Update  02/13/2020 Time of Session: 9:05am [name] Layfield MRN: [id number]  Principal Diagnosis: Suicidal ideation  Secondary Diagnoses: Principal Problem:   Suicidal ideation Active Problems:   Schizoaffective disorder (HCC)   Bipolar I disorder (HCC)   Current Medications:  Current Facility-Administered Medications  Medication Dose Route Frequency Provider Last Rate Last Admin   acetaminophen (TYLENOL) tablet 650 mg  650 mg Oral Q6H PRN Antonieta Pert, MD   650 mg at 02/12/20 1838   alum & mag hydroxide-simeth (MAALOX/MYLANTA) 200-200-20 MG/5ML suspension 30 mL  30 mL Oral Q4H PRN Antonieta Pert, MD       hydrOXYzine (ATARAX/VISTARIL) tablet 25 mg  25 mg Oral TID PRN Antonieta Pert, MD   25 mg at 02/12/20 2216   lamoTRIgine (LAMICTAL) tablet 100 mg  100 mg Oral Daily Antonieta Pert, MD   100 mg at 02/13/20 0912   lamoTRIgine (LAMICTAL) tablet 25 mg  25 mg Oral QPM Antonieta Pert, MD   25 mg at 02/12/20 1838   magnesium hydroxide (MILK OF MAGNESIA) suspension 30 mL  30 mL Oral Daily PRN Antonieta Pert, MD       paliperidone (INVEGA) 24 hr tablet 3 mg  3 mg Oral QHS Antonieta Pert, MD       prazosin (MINIPRESS) capsule 1 mg  1 mg Oral QHS Antonieta Pert, MD   1 mg at 02/12/20 2134   PTA Medications: Medications Prior to Admission  Medication Sig Dispense Refill Last Dose   EPINEPHrine 0.3 mg/0.3 mL IJ SOAJ injection Inject 0.3 mLs (0.3 mg total) into the muscle as needed for anaphylaxis. (Patient taking differently: Inject 0.3 mg into the muscle as needed for anaphylaxis. ) 1 each 1    hydrOXYzine (ATARAX/VISTARIL) 10 MG tablet Take 1-2 tablets (10-20 mg total) by mouth every 8 (eight) hours as needed. (Patient taking differently: Take 10-20 mg by mouth every 8 (eight) hours as needed for anxiety. ) 60 tablet 1    lamoTRIgine (LAMICTAL) 100 MG tablet TAKE 1 TABLET BY MOUTH EVERY DAY (Patient taking  differently: Take 100 mg by mouth daily. ) 90 tablet 0    Norethin-Eth Estrad-Fe Biphas (LO LOESTRIN FE PO) Take 1 tablet by mouth daily.       paliperidone (INVEGA) 3 MG 24 hr tablet Take 1 tablet (3 mg total) by mouth daily.      traZODone (DESYREL) 50 MG tablet Take 1 tablet (50 mg total) by mouth at bedtime as needed for sleep.       Patient Stressors: Occupational concerns Substance abuse  Patient Strengths: Ability for insight Communication skills Supportive family/friends  Treatment Modalities: Medication Management, Group therapy, Case management,  1 to 1 session with clinician, Psychoeducation, Recreational therapy.   Physician Treatment Plan for Primary Diagnosis: Suicidal ideation Long Term Goal(s): Improvement in symptoms so as ready for discharge Improvement in symptoms so as ready for discharge   Short Term Goals: Ability to identify changes in lifestyle to reduce recurrence of condition will improve Ability to verbalize feelings will improve Ability to disclose and discuss suicidal ideas Ability to identify and develop effective coping behaviors will improve Ability to identify changes in lifestyle to reduce recurrence of condition will improve  Medication Management: Evaluate patient's response, side effects, and tolerance of medication regimen.  Therapeutic Interventions: 1 to 1 sessions, Unit Group sessions and Medication administration.  Evaluation of Outcomes: Progressing  Physician Treatment Plan for Secondary Diagnosis:  Principal Problem:   Suicidal ideation Active Problems:   Schizoaffective disorder (HCC)   Bipolar I disorder (HCC)  Long Term Goal(s): Improvement in symptoms so as ready for discharge Improvement in symptoms so as ready for discharge   Short Term Goals: Ability to identify changes in lifestyle to reduce recurrence of condition will improve Ability to verbalize feelings will improve Ability to disclose and discuss suicidal  ideas Ability to identify and develop effective coping behaviors will improve Ability to identify changes in lifestyle to reduce recurrence of condition will improve     Medication Management: Evaluate patient's response, side effects, and tolerance of medication regimen.  Therapeutic Interventions: 1 to 1 sessions, Unit Group sessions and Medication administration.  Evaluation of Outcomes: Progressing   RN Treatment Plan for Primary Diagnosis: Suicidal ideation Long Term Goal(s): Knowledge of disease and therapeutic regimen to maintain health will improve  Short Term Goals: Ability to participate in decision making will improve, Ability to verbalize feelings will improve, Ability to disclose and discuss suicidal ideas and Ability to identify and develop effective coping behaviors will improve  Medication Management: RN will administer medications as ordered by provider, will assess and evaluate patient's response and provide education to patient for prescribed medication. RN will report any adverse and/or side effects to prescribing provider.  Therapeutic Interventions: 1 on 1 counseling sessions, Psychoeducation, Medication administration, Evaluate responses to treatment, Monitor vital signs and CBGs as ordered, Perform/monitor CIWA, COWS, AIMS and Fall Risk screenings as ordered, Perform wound care treatments as ordered.  Evaluation of Outcomes: Progressing   LCSW Treatment Plan for Primary Diagnosis: Suicidal ideation Long Term Goal(s): Safe transition to appropriate next level of care at discharge, Engage patient in therapeutic group addressing interpersonal concerns.  Short Term Goals: Engage patient in aftercare planning with referrals and resources, Increase social support, Increase emotional regulation, Identify triggers associated with mental health/substance abuse issues and Increase skills for wellness and recovery  Therapeutic Interventions: Assess for all discharge needs, 1  to 1 time with Social worker, Explore available resources and support systems, Assess for adequacy in community support network, Educate family and significant other(s) on suicide prevention, Complete Psychosocial Assessment, Interpersonal group therapy.  Evaluation of Outcomes: Progressing   Progress in Treatment: Attending groups: Yes. Participating in groups: Yes. Taking medication as prescribed: Yes. Toleration medication: Yes. Family/Significant other contact made: Yes, individual(s) contacted:  Joslyn Hy (mother) Patient understands diagnosis: Yes. Discussing patient identified problems/goals with staff: Yes. Medical problems stabilized or resolved: Yes. Denies suicidal/homicidal ideation: Yes. Issues/concerns per patient self-inventory: No.   New problem(s) identified: No, Describe:  None  New Short Term/Long Term Goal(s): medication stabilization, elimination of SI thoughts, development of comprehensive mental wellness plan.   Patient Goals: "To get to a place where I have hope and to gain coping skills for my mental illness"  Discharge Plan or Barriers: Patient recently admitted. CSW will continue to follow and assess for appropriate referrals and possible discharge planning.   Reason for Continuation of Hospitalization: Depression Medication stabilization  Estimated Length of Stay: 3 to 5 days  Attendees: Patient: Alexis [name] 02/13/2020   Physician: Landry Mellow, MD 02/13/2020   Nursing:  02/13/2020   RN Care Manager: 02/13/2020   Social Worker: Ruthann Cancer, LCSW 02/13/2020   Recreational Therapist:  02/13/2020   Other:  02/13/2020   Other:  02/13/2020   Other: 02/13/2020      Scribe for Treatment Team: Aram Beecham, LCSWA 02/13/2020 9:23 AM
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
## 2020-02-13 NOTE — Progress Notes (Signed)
Adult Psychoeducational Group Note  Date:  02/13/2020 Time:  1:23 PM  Group Topic/Focus:  Goals Group:   The focus of this group is to help patients establish daily goals to achieve during treatment and discuss how the patient can incorporate goal setting into their daily lives to aide in recovery.  Participation Level:  Active  Participation Quality:  Appropriate  Affect:  Appropriate  Cognitive:  Alert and Appropriate  Insight: Appropriate, Good and Improving  Engagement in Group:  Engaged  Modes of Intervention:  Discussion  Additional Comments:  Pt attended group and participated in discussion.  Sundee Garland R Aerith Canal 02/13/2020, 1:23 PM

## 2020-02-13 NOTE — Progress Notes (Signed)
   02/13/20 2045  COVID-19 Daily Checkoff  Have you had a fever (temp > 37.80C/100F)  in the past 24 hours?  No  COVID-19 EXPOSURE  Have you traveled outside the state in the past 14 days? No  Have you been in contact with someone with a confirmed diagnosis of COVID-19 or PUI in the past 14 days without wearing appropriate PPE? No  Have you been living in the same home as a person with confirmed diagnosis of COVID-19 or a PUI (household contact)? No  Have you been diagnosed with COVID-19? No

## 2020-02-13 NOTE — Progress Notes (Signed)
Adult Psychoeducational Group Note  Date:  02/13/2020 Time:  10:12 PM  Group Topic/Focus:  Wrap-Up Group:   The focus of this group is to help patients review their daily goal of treatment and discuss progress on daily workbooks.  Participation Level:  Active  Participation Quality:  Appropriate  Affect:  Appropriate  Cognitive:  Alert  Insight: Improving  Engagement in Group:  Developing/Improving  Modes of Intervention:  Education and Support  Additional Comments:  [name] outlined ways to deal with her Schizoaffective. Bipolar disorder. She said that [facility] gives her the hope and safety she needed. " I think for the first time in two and half years, I now feel safe and have hope"" I think I did the right thing by signing up to be here". " this place is helping me cope with my disorder.  Alexis [name]  [name] 02/13/2020, 10:12 PM

## 2020-02-14 MED ORDER — LORAZEPAM 1 MG PO TABS
1.0000 mg | ORAL_TABLET | Freq: Once | ORAL | Status: AC
  Administered 2020-02-14: 1 mg via ORAL
  Filled 2020-02-14: qty 1

## 2020-02-14 MED ORDER — LORAZEPAM 1 MG PO TABS: 1.0000 mg | ORAL_TABLET | Freq: Once | ORAL | Status: DC

## 2020-02-14 NOTE — Plan of Care (Signed)
Nurse discussed anxiety, depression and coping skills with patient.  

## 2020-02-14 NOTE — Progress Notes (Signed)
   02/14/20 2224  COVID-19 Daily Checkoff  Have you had a fever (temp > 37.80C/100F)  in the past 24 hours?  No  COVID-19 EXPOSURE  Have you traveled outside the state in the past 14 days? No  Have you been in contact with someone with a confirmed diagnosis of COVID-19 or PUI in the past 14 days without wearing appropriate PPE? No  Have you been living in the same home as a person with confirmed diagnosis of COVID-19 or a PUI (household contact)? No  Have you been diagnosed with COVID-19? No

## 2020-02-14 NOTE — Progress Notes (Signed)
Patient states that she napped today. In addition, she stated that her follow-up care has been arranged. She also indicated that she had a good talk with her father. Her goal for tomorrow is to get discharged and to work on "progressing".
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
## 2020-02-14 NOTE — Progress Notes (Signed)
D:  Patient's self inventory sheet, patient has poor sleep, no sleep medication.  Poor appetite, low energy level, good concentration.  Rated depression and anxiety 3, denied hopeless.  Denied withdrawals.  Denied SI.  Physical problems, headaches, no physical pain.  Goal get resources to continue healing outside [facility], peer support groups.  Plans to talk to SW and MD.  Does have discharge plans. A:  Medications administered per MD orders.  Emotional support and encouragement given patient. R:  Denied SI and HI, contracts for safety.  Denied A/V hallucinations.  Safety maintained with 15 minute checks.

## 2020-02-15 ENCOUNTER — Other Ambulatory Visit: Payer: Self-pay | Admitting: Physician Assistant

## 2020-02-15 DIAGNOSIS — F250 Schizoaffective disorder, bipolar type: Secondary | ICD-10-CM

## 2020-02-15 DIAGNOSIS — F319 Bipolar disorder, unspecified: Secondary | ICD-10-CM

## 2020-02-15 MED ORDER — HYDROXYZINE HCL 25 MG PO TABS
25.0000 mg | ORAL_TABLET | Freq: Three times a day (TID) | ORAL | 0 refills | Status: DC | PRN
Start: 2020-02-15 — End: 2020-04-29

## 2020-02-15 MED ORDER — LAMOTRIGINE 100 MG PO TABS: 100.0000 mg | ORAL_TABLET | Freq: Every day | ORAL | 0 refills | Status: DC

## 2020-02-15 MED ORDER — LAMOTRIGINE 25 MG PO TABS: 25.0000 mg | ORAL_TABLET | Freq: Every evening | ORAL | 0 refills | Status: DC

## 2020-02-15 MED ORDER — PALIPERIDONE ER 3 MG PO TB24: 3.0000 mg | ORAL_TABLET | Freq: Every day | ORAL | 0 refills | Status: DC

## 2020-02-15 NOTE — Progress Notes (Signed)
  [facility] Adult Case Management Discharge Plan :  Will you be returning to the same living situation after discharge:  Yes,  Home with parents  At discharge, do you have transportation home?: Yes,  Mother  Do you have the ability to pay for your medications: Yes,  Private insurance   Release of information consent forms completed and in the chart;  Patient's signature needed at discharge.  Patient to Follow up at:  Follow-up Information    [facility], [facility] Follow up on 02/22/2020.   Specialty: Behavioral Health Why: Telehealth appointment on 02/22/2020 at 11am.  Appointment can be changed to in-person after the initial session.  Contact information: [address] [place] [address] [place] [address] [phone]        [facility] Call.   Why: Please call [phone] at 3:00pm to complete paperwork and initial appointment.   Contact information: [address] [place] [address] [phone]               Next level of care provider has access to [facility] Link:no  Safety Planning and Suicide Prevention discussed: Yes,  mother     Has patient been referred to the Quitline?: N/A patient is not a smoker  Patient has been referred for addiction treatment: N/A  Aram Beecham, LCSWA 02/15/2020, 10:02 AM
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
## 2020-02-15 NOTE — Progress Notes (Addendum)
Pt had a VH of the grim reaper earlier tonight. She saw him with his finger motioning her to "come here" and he had his finger to his lips motioning her to "stay quiet." She also shares that she saw him earlier during the day when she was trying to sleep. She said that she got "scared' and "freaked out" and had a nightmare. Pt was hesitant to share what she saw in her nightmare at first, but then did with encouragement. Pt said that "I saw myself hanging." Pt was asked about how she coped with that situation and she said that she just went back to sleep. Pt didn't alert staff or use any of her coping skills. She said that later on when she was in the dayroom she did cry in front of everyone. She said she still felt scared tonight and her PRN vistaril was administered but it wasn't effective. Reported trouble falling asleep as well. She said she was also getting really anxious about not being able to discharge tomorrow because of how her night is going so far. Nira Conn, NP was notified and a one time order of 1 mg of Ativan was administered at 2224. It was effective and pt has been resting comfortably since.   She said that she was supposed to have a job interview for another job today, but things ended up working out because it has been rescheduled. The employer is unavailable at the moment. Pt said she was upset about this because her Mom lied to them that she wasn't going to be able to make it to the interview because she has a stomach bug, instead of telling them the truth. She also said that her mother hasn't been very supportive, her mother said that her hospitalization at [facility] is going to be expensive. Pt said that her Mom is "money hungry." Pt was asked about any stressors or triggers causing her anxiety tonight. She said that "my friends are getting discharged tomorrow" and she may miss her hair appointment on Saturday that she had to reschedule 4 months later in the first place. Pt educated about her  mental health being her priority at the moment. Pt improves with 1:1 sessions, may be attention seeking behavior. Pt denies SI/HI and AVH. Verbally agrees to approach staff immediately for any thoughts of hurting herself or anyone else. Active listening, reassurance, and support provided. Medications administered as ordered by MD. Q 15 min safety checks continue. Pt's safety has been maintained.    02/14/20 2224  Psych Admission Type (Psych Patients Only)  Admission Status Voluntary  Psychosocial Assessment  Patient Complaints Anxiety;Depression;Crying spells;Sadness;Worrying;Nervousness;Sleep disturbance  Eye Contact Fair  Facial Expression Anxious;Sad;Sullen;Worried  Affect Anxious;Depressed;Sad;Sullen  Sport and exercise psychologist Cooperative;Anxious;Fidgety  Mood Depressed;Anxious;Sad;Sullen;Fearful  Thought Process  Coherency WDL  Content Paranoia;Preoccupation  Delusions None reported or observed  Perception Hallucinations  Hallucination Visual  Judgment Poor  Confusion None  Danger to Self  Current suicidal ideation? Denies  Danger to Others  Danger to Others None reported or observed

## 2020-02-15 NOTE — [facility] Suicide Risk Assessment (Signed)
[facility] Discharge Suicide Risk Assessment   Principal Problem: Suicidal ideation Discharge Diagnoses: Principal Problem:   Suicidal ideation Active Problems:   Schizoaffective disorder (HCC)   Bipolar I disorder (HCC)   Total Time spent with patient: 15 minutes  Musculoskeletal: Strength & Muscle Tone: within normal limits Gait & Station: normal Patient leans: N/A  Psychiatric Specialty Exam: Review of Systems  Psychiatric/Behavioral: Positive for hallucinations.  All other systems reviewed and are negative.   Blood pressure 116/74, pulse 88, temperature 98.8 F (37.1 C), temperature source Oral, resp. rate 18, height 5\' 5"  (1.651 m), weight 62.1 kg, SpO2 100 %.Body mass index is 22.76 kg/m.  General Appearance: Casual  Eye Contact::  Good  Speech:  Normal Rate409  Volume:  Normal  Mood:  Euthymic  Affect:  Congruent  Thought Process:  Coherent and Descriptions of Associations: Intact  Orientation:  Full (Time, Place, and Person)  Thought Content:  Logical  Suicidal Thoughts:  No  Homicidal Thoughts:  No  Memory:  Immediate;   Good Recent;   Good Remote;   Good  Judgement:  Intact  Insight:  Fair  Psychomotor Activity:  Normal  Concentration:  Good  Recall:  Good  Fund of Knowledge:Good  Language: Good  Akathisia:  Negative  Handed:  Right  AIMS (if indicated):     Assets:  Desire for Improvement Resilience  Sleep:  Number of Hours: 5.75  Cognition: WNL  ADL's:  Intact   Mental Status Per Nursing Assessment::   On Admission:  NA  Demographic Factors:  Unemployed  Loss Factors: NA  Historical Factors: Impulsivity  Risk Reduction Factors:   Living with another person, especially a relative, Positive social support, Positive therapeutic relationship and Positive coping skills or problem solving skills  Continued Clinical Symptoms:  Severe Anxiety and/or Agitation Depression:   Impulsivity More than one psychiatric diagnosis  Cognitive Features That  Contribute To Risk:  None    Suicide Risk:  Minimal: No identifiable suicidal ideation.  Patients presenting with no risk factors but with morbid ruminations; may be classified as minimal risk based on the severity of the depressive symptoms   Follow-up Information    [facility] Follow up on 02/22/2020.   Specialty: Behavioral Health Why: Telehealth appointment on 02/22/2020 at 11am.  Appointment can be changed to in-person after the initial session.  Contact information: [address] [place] [address] [place] [phone]        [facility] Call.   Why: Please call [phone] at 3:00pm to complete paperwork and initial appointment.   Contact information: [address] [place] [phone]               Plan Of Care/Follow-up recommendations:  Activity:  ad lib  [phone], MD 02/15/2020, 8:07 AM

## 2020-02-15 NOTE — Progress Notes (Signed)
Recreation Therapy Notes  Date:  10.1.21 Time: 0930 Location: [address] Dayroom  Group Topic: Stress Management  Goal Area(s) Addresses:  Patient will identify positive stress management techniques. Patient will identify benefits of using stress management post d/c.  Behavioral Response: Engaged  Intervention: Stress Management    Activity:  Guided Imagery.  LRT read a script that took patients on a journey to the beach to relax by the calming, peaceful waves.  Patients were to listen and follow along as the script was read to engage in the activity.    Education:  Stress Management, Discharge Planning.   Education Outcome: Acknowledges Education  Clinical Observations/Feedback: Pt attended and participated in activity.    Caroll Rancher, LRT/CTRS     Lillia Abed, Velmer Woelfel A 02/15/2020 9:58 AM

## 2020-02-15 NOTE — Progress Notes (Signed)
Discharge Note:  Patient denies SI/HI AVH at this time. Discharge instructions, AVS, prescriptions and transition record gone over with patient. Patient agrees to comply with medication management, follow-up visit, and outpatient therapy. Patient belongings returned to patient. Patient questions and concerns addressed and answered.  Patient ambulatory off unit.  Patient discharged to home with friend.   

## 2020-02-18 NOTE — Telephone Encounter (Signed)
review 

## 2020-02-19 ENCOUNTER — Ambulatory Visit: Payer: Federal, State, Local not specified - PPO | Admitting: Neurology

## 2020-02-20 ENCOUNTER — Ambulatory Visit (INDEPENDENT_AMBULATORY_CARE_PROVIDER_SITE_OTHER): Payer: Federal, State, Local not specified - PPO | Admitting: Physician Assistant

## 2020-02-20 ENCOUNTER — Encounter: Payer: Self-pay | Admitting: Physician Assistant

## 2020-02-20 DIAGNOSIS — F251 Schizoaffective disorder, depressive type: Secondary | ICD-10-CM | POA: Diagnosis not present

## 2020-02-20 DIAGNOSIS — F411 Generalized anxiety disorder: Secondary | ICD-10-CM | POA: Diagnosis not present

## 2020-02-20 DIAGNOSIS — R443 Hallucinations, unspecified: Secondary | ICD-10-CM

## 2020-02-20 DIAGNOSIS — F514 Sleep terrors [night terrors]: Secondary | ICD-10-CM

## 2020-02-20 MED ORDER — PALIPERIDONE ER 3 MG PO TB24: 3.0000 mg | ORAL_TABLET | Freq: Two times a day (BID) | ORAL | 0 refills | Status: DC

## 2020-02-20 NOTE — Progress Notes (Signed)
[facility]  Patient ID: Alexis [name],  MRN: [id number]  PCP: Alexis Shackleton, NP-C  Date of Evaluation: 02/20/2020 Time spent:40 minutes  Chief Complaint:  Chief Complaint    Depression; Paranoid      HISTORY/CURRENT STATUS: HPI F/U hospital.  Inpatient at [facility] 02/11/2020-02/15/2020, after spending a day at [facility] for SI with plan to hang herself.  Was changed from Saphris to [place].  Had only been on Saphris for a few weeks. Began hearing voices about 6 weeks ago. Wasn't having SI until around 2-3 weeks ago.   States she feels a little better.  Audio and visual hallucinations are better but she did have a bad 1 last night that terrified her, seeing herself hanging.  She feels extremely tired now, worked yesterday for the first time in a couple of weeks.  She will be going to a part-time schedule beginning next week.  She was started on Invega 3 mg p.o. twice daily as an inpatient and for some reason it was decreased to 1 daily.  Thanks she was better when on it twice a day, but of course it is too soon to tell.  She was given prazosin as an inpatient and she passed out when she was made to get up to have her vital signs checked.  She thinks the [place] was decreased at the same time the prazosin was stopped because they were trying to figure out which drug caused her to faint.  Prior to last week's hospitalization, she had trouble enjoying things.  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  There were no times of mania with increased energy and decreased need for sleep.  She states she is never been manic, but may be hypomanic.  She had not been having impulsivity, no risky behavior, no increased spending, no increased libido, or grandiosity.  Denies dizziness, syncope, seizures, numbness, tingling, tremor, tics, unsteady gait, slurred speech, confusion. Denies muscle or joint pain, stiffness, or dystonia.  Individual Medical History/ Review of  Systems: Changes? :No    Past medications for mental health diagnoses include: Depakote, Abilify, Zyprexa 25, Paxil CR, Prozac, Wellbutrin, Lexapro, Zoloft, Lamictal, Vraylar, Xanax, Risperdal caused galactorrhea, [name] was too expensive, Gabapentin wasn't helpful, Mirtazepine caused nightmares, Seroquel XR 400 for 9 mos stopped DT lost response, Geodon 60 BID, Lithium 1200 briefly, Saphris, Invega, Prazosin caused syncope after 1 dose as an inpatient.   Allergies: Dopamine and Peanut-containing drug products  Current Medications:  Current Outpatient Medications:  .  EPINEPHrine 0.3 mg/0.3 mL IJ SOAJ injection, Inject 0.3 mLs (0.3 mg total) into the muscle as needed for anaphylaxis., Disp: 1 each, Rfl: 1 .  hydrOXYzine (ATARAX/VISTARIL) 25 MG tablet, Take 1 tablet (25 mg total) by mouth 3 (three) times daily as needed for anxiety., Disp: 75 tablet, Rfl: 0 .  lamoTRIgine (LAMICTAL) 100 MG tablet, Take 1 tablet (100 mg total) by mouth daily. For mood stabilization, Disp: 100 tablet, Rfl: 0 .  lamoTRIgine (LAMICTAL) 25 MG tablet, Take 1 tablet (25 mg total) by mouth every evening. For mood stabilization, Disp: 30 tablet, Rfl: 0 .  Norethin-Eth Estrad-Fe Biphas (LO LOESTRIN FE PO), Take 1 tablet by mouth daily. , Disp: , Rfl:  .  paliperidone (INVEGA) 3 MG 24 hr tablet, Take 1 tablet (3 mg total) by mouth 2 (two) times daily. For mood control, Disp: 60 tablet, Rfl: 0 Medication Side Effects: none  Family Medical/ Social History: Changes? No  MENTAL  HEALTH EXAM:  There were no vitals taken for this visit.There is no height or weight on file to calculate BMI.  General Appearance: Casual, Neat and Well Groomed  Eye Contact:  Good  Speech:  Clear and Coherent and Normal Rate  Volume:  Normal  Mood:  Depressed  Affect:  Depressed  Thought Process:  Goal Directed and Descriptions of Associations: Intact  Orientation:  Full (Time, Place, and Person)  Thought Content: Logical   Suicidal  Thoughts:  No  Homicidal Thoughts:  No  Memory:  WNL  Judgement:  Good  Insight:  Good  Psychomotor Activity:  Normal  Concentration:  Concentration: Good  Recall:  Good  Fund of Knowledge: Good  Language: Good  Assets:  Desire for Improvement  ADL's:  Intact  Cognition: WNL  Prognosis:  Good   02/12/2020 CBC was normal, CMP was normal specifically glucose 66, hemoglobin A1c was 5.8, lipid panel total cholesterol 158, triglycerides 89, HDL 58, LDL 82, TSH was 2.5  DIAGNOSES:    ICD-10-CM   1. Schizoaffective disorder, depressive type (HCC)  F25.1   2. Night terrors  F51.4   3. Generalized anxiety disorder  F41.1   4. Hallucinations, unspecified  R44.3     Receiving Psychotherapy: Yes  Will be starting with a new therapist soon.   RECOMMENDATIONS:  PDMP was reviewed. I provided 45 minutes of non face-to-face time during this encounter, including time spent reviewing hospital records and labs. Since she responded really well to the Invega at a higher dose, I recommend we increase it back.  The plan will be for her to take Gean [place] but I would like to keep her on this for at least a month to see how she responds before giving her an injection.  She understands and agrees. Increase Invega 3 mg back to twice daily. Increased Lamictal to 100 mg qhs+25mg  as an inpatient. Continue hydroxyzine 25 mg, 1 p.o. 3 times daily as needed. Continue counseling. Return in 4 weeks.  Melony Overly, PA-C

## 2020-02-22 DIAGNOSIS — F331 Major depressive disorder, recurrent, moderate: Secondary | ICD-10-CM | POA: Diagnosis not present

## 2020-02-27 ENCOUNTER — Telehealth: Payer: Self-pay | Admitting: Physician Assistant

## 2020-02-27 DIAGNOSIS — Z20828 Contact with and (suspected) exposure to other viral communicable diseases: Secondary | ICD-10-CM | POA: Diagnosis not present

## 2020-02-27 NOTE — Telephone Encounter (Signed)
How is she feeling mentally? Does she feel like the Hinda Glatter is helping or can she tell yet? Have her take both pills in the evening, total 6mg  in evening.  We may lose some of the effectiveness mentally by dosing it this way though.  Have her try this for 4 or 5 days and call if there is no improvement.

## 2020-02-27 NOTE — Telephone Encounter (Signed)
Please review

## 2020-02-27 NOTE — Telephone Encounter (Signed)
Alexis [name] left message that her new meds are working well, but are causing her to be nauseous w/ a lot of vomiting. Pt wants to know what she can take to control it.

## 2020-02-28 ENCOUNTER — Other Ambulatory Visit: Payer: Self-pay | Admitting: Physician Assistant

## 2020-02-28 MED ORDER — ONDANSETRON HCL 4 MG PO TABS: 4.0000 mg | ORAL_TABLET | Freq: Three times a day (TID) | ORAL | 0 refills | Status: DC | PRN

## 2020-02-28 NOTE — Telephone Encounter (Signed)
Ok, try the 6 mg in evening and see how she does with that. I'll send in an Rx for zofran for prn.

## 2020-02-28 NOTE — Progress Notes (Signed)
zofran

## 2020-02-28 NOTE — Telephone Encounter (Signed)
Left message with information and to call back with any concerns or questions

## 2020-02-28 NOTE — Telephone Encounter (Signed)
Rtc to patient and she reports she has nausea, vomiting and dizziness all throughout the day. She does feel that's it's helping mentally but she is not able to function without something to help with this. In fact she came home today due to the dizziness causing a bad headache. She did have the n/v in the hospital and tried to deal with it the best she could, they recommended saltine crackers and ginger ale but that's not helping.

## 2020-02-29 DIAGNOSIS — F250 Schizoaffective disorder, bipolar type: Secondary | ICD-10-CM | POA: Diagnosis not present

## 2020-02-29 DIAGNOSIS — F4310 Post-traumatic stress disorder, unspecified: Secondary | ICD-10-CM | POA: Diagnosis not present

## 2020-03-04 ENCOUNTER — Telehealth: Payer: Self-pay | Admitting: Physician Assistant

## 2020-03-04 ENCOUNTER — Other Ambulatory Visit: Payer: Self-pay | Admitting: Physician Assistant

## 2020-03-04 MED ORDER — RISPERIDONE 3 MG PO TABS: 3.0000 mg | ORAL_TABLET | Freq: Every day | ORAL | 0 refills | Status: DC

## 2020-03-04 MED ORDER — RISPERIDONE 3 MG PO TABS: 3.0000 mg | ORAL_TABLET | Freq: Once | ORAL | Status: DC

## 2020-03-04 NOTE — Telephone Encounter (Signed)
Rtc to patient at her work [employer]) she reports hallucinations and voices all the time, worsening They wanted to pull her off the floor but she doesn't want to do that. She'is taking all the Invega at night due to the nausea.    Work # [phone]

## 2020-03-04 NOTE — Telephone Encounter (Signed)
I want to change her back to Risperdal.  I know she has taken that before and she had galactorrhea but it is closely related to the [facility], without usually causing the nausea, and she has responded well to it in the past.  I will send in a prescription for that. We can go ahead and add Abilify 2 mg with the Risperdal that will help prevent galactorrhea.  It would be okay if we start the Risperdal only, until I see her back in 2 to 3 weeks and then we can add the Abilify and if we need to at any point.  Either way is fine.

## 2020-03-04 NOTE — Telephone Encounter (Signed)
Rtc to patient, she will try bid dosing again with the Invega first with the zofran. If not effective wants to try the Risperdal. She is asking to go ahead and send the [facility]dal to [facility] if switching the Hinda Glatter is not helpful or tolerated.  She said to hold off on Abilify for right now though.

## 2020-03-04 NOTE — Telephone Encounter (Signed)
Pt called frantic stating she is hearing voices and really need some help. Stated she is not suicidal but would like someone to call her asap.

## 2020-03-04 NOTE — Telephone Encounter (Signed)
Alexis [name] had told me in the office that patient prefers not to change medicines at this point and ask if she can try to tolerate the Invega at 3 mg twice daily.  I stated that is fine and use Zofran as needed for the nausea.  I will go ahead and send in Risperdal.  Patient knows not to take it with the Invega.

## 2020-03-04 NOTE — Telephone Encounter (Signed)
Prescription for R[facility]dal was sent in to [facility] [address]

## 2020-03-05 DIAGNOSIS — F4312 Post-traumatic stress disorder, chronic: Secondary | ICD-10-CM | POA: Diagnosis not present

## 2020-03-05 DIAGNOSIS — F250 Schizoaffective disorder, bipolar type: Secondary | ICD-10-CM | POA: Diagnosis not present

## 2020-03-07 DIAGNOSIS — F250 Schizoaffective disorder, bipolar type: Secondary | ICD-10-CM | POA: Diagnosis not present

## 2020-03-07 DIAGNOSIS — F4312 Post-traumatic stress disorder, chronic: Secondary | ICD-10-CM | POA: Diagnosis not present

## 2020-03-12 DIAGNOSIS — F250 Schizoaffective disorder, bipolar type: Secondary | ICD-10-CM | POA: Diagnosis not present

## 2020-03-12 DIAGNOSIS — F4312 Post-traumatic stress disorder, chronic: Secondary | ICD-10-CM | POA: Diagnosis not present

## 2020-03-18 DIAGNOSIS — F4312 Post-traumatic stress disorder, chronic: Secondary | ICD-10-CM | POA: Diagnosis not present

## 2020-03-18 DIAGNOSIS — F250 Schizoaffective disorder, bipolar type: Secondary | ICD-10-CM | POA: Diagnosis not present

## 2020-03-21 ENCOUNTER — Ambulatory Visit (INDEPENDENT_AMBULATORY_CARE_PROVIDER_SITE_OTHER): Payer: Federal, State, Local not specified - PPO | Admitting: Physician Assistant

## 2020-03-21 ENCOUNTER — Other Ambulatory Visit: Payer: Self-pay | Admitting: Physician Assistant

## 2020-03-21 ENCOUNTER — Other Ambulatory Visit: Payer: Self-pay

## 2020-03-21 ENCOUNTER — Encounter: Payer: Self-pay | Admitting: Physician Assistant

## 2020-03-21 DIAGNOSIS — F250 Schizoaffective disorder, bipolar type: Secondary | ICD-10-CM | POA: Diagnosis not present

## 2020-03-21 DIAGNOSIS — F411 Generalized anxiety disorder: Secondary | ICD-10-CM

## 2020-03-21 DIAGNOSIS — F4312 Post-traumatic stress disorder, chronic: Secondary | ICD-10-CM | POA: Diagnosis not present

## 2020-03-21 DIAGNOSIS — R443 Hallucinations, unspecified: Secondary | ICD-10-CM | POA: Diagnosis not present

## 2020-03-21 DIAGNOSIS — F331 Major depressive disorder, recurrent, moderate: Secondary | ICD-10-CM | POA: Diagnosis not present

## 2020-03-21 DIAGNOSIS — F5105 Insomnia due to other mental disorder: Secondary | ICD-10-CM

## 2020-03-21 DIAGNOSIS — F251 Schizoaffective disorder, depressive type: Secondary | ICD-10-CM | POA: Diagnosis not present

## 2020-03-21 DIAGNOSIS — F99 Mental disorder, not otherwise specified: Secondary | ICD-10-CM

## 2020-03-21 MED ORDER — RISPERIDONE 4 MG PO TABS: 4.0000 mg | ORAL_TABLET | Freq: Every day | ORAL | 1 refills | Status: DC

## 2020-03-21 MED ORDER — ARIPIPRAZOLE 2 MG PO TABS: 2.0000 mg | ORAL_TABLET | Freq: Every day | ORAL | 1 refills | Status: DC

## 2020-03-21 MED ORDER — PROMETHAZINE HCL 25 MG PO TABS
12.5000 mg | ORAL_TABLET | Freq: Four times a day (QID) | ORAL | 0 refills | Status: DC | PRN
Start: 2020-03-21 — End: 2020-06-30

## 2020-03-21 NOTE — Progress Notes (Signed)
[facility]  Patient ID: Alexis [name],  MRN: [id number]  PCP: Avanell Shackleton, NP-C  Date of Evaluation: 03/21/2020 Time spent:45 minutes  Chief Complaint:  Chief Complaint    Anxiety; Depression; Hallucinations      HISTORY/CURRENT STATUS: Follow-up after hospitalization.  She was hospitalized on 02/11/2020 for worsening hallucinations. She was having more symptoms of depression despite starting Saphris a month prior to that hospitalization.  She was having suicidal thoughts and feared for her safety if she went home.  She was voluntarily committed.  The Saphris was discontinued and she was started on Invega.  She has called the office several times to discuss the [place]. The Hinda Glatter was making her terribly nauseated.  Even with Zofran she was not able to tolerate it.  We changed to Risperdal, which she had taken in the past but caused galactorrhea.  We stopped it due to that side effect.  She has only been on it about a week now.  No galactorrhea but has had to increase a bra cup size in the past 5 weeks, when initially going on [place].  She is still having some nausea but gets better since going off Invega.  Her hours at work have been cut back from 40/week to 30/week due to her declining mental health.  She is able to work for 30 hours but she is very tired when she gets home.  She and her boyfriend have had differing opinions as to whether she has schizoaffective disorder along with bipolar 1 or bipolar 2.  She states she is never been manic, but may be hypomanic.  Upon further questioning though she does state she has had impulsivity in the past associated with increased spending, increased energy and decreased need for sleep.  Also grandiosity.  She is not having any of those symptoms now.  Reports paranoia, feeling like someone is always looking at her or watching over her back or something.  She is still hearing voices, now more like noise in the background.  Not telling  her to do things.  No visual hallucinations.  Still feels depressed.  She is having a hard time enjoying things.  Energy and motivation are still low.  Not crying easily.  Appetite is normal.  No weight changes.  Still has trouble sleeping at times.  Not quite as bad.  No suicidal or homicidal thoughts right now.  Denies dizziness, syncope, seizures, numbness, tingling, tremor, tics, unsteady gait, slurred speech, confusion. Denies muscle or joint pain, stiffness, or dystonia.  Individual Medical History/ Review of Systems: Changes? :No    Past medications for mental health diagnoses include: Depakote, Abilify, Zyprexa 25, Paxil CR, Prozac, Wellbutrin, Lexapro, Zoloft, Lamictal, Vraylar, Xanax, Risperdal caused galactorrhea, [name] was too expensive, Gabapentin wasn't helpful, Mirtazepine caused nightmares, Seroquel XR 400 for 9 mos stopped DT lost response, Geodon 60 BID, Lithium 1200 briefly, Saphris, Invega, Prazosin caused syncope after 1 dose as an inpatient.   Allergies: Dopamine and Peanut-containing drug products  Current Medications:  Current Outpatient Medications:  .  hydrOXYzine (ATARAX/VISTARIL) 25 MG tablet, Take 1 tablet (25 mg total) by mouth 3 (three) times daily as needed for anxiety., Disp: 75 tablet, Rfl: 0 .  lamoTRIgine (LAMICTAL) 100 MG tablet, Take 1 tablet (100 mg total) by mouth daily. For mood stabilization, Disp: 100 tablet, Rfl: 0 .  lamoTRIgine (LAMICTAL) 25 MG tablet, Take 1 tablet (25 mg total) by mouth every evening. For mood stabilization, Disp: 30 tablet, Rfl: 0 .  Norethin-Eth Estrad-Fe Biphas (LO LOESTRIN FE PO), Take 1 tablet by mouth daily. , Disp: , Rfl:  .  ondansetron (ZOFRAN) 4 MG tablet, Take 1 tablet (4 mg total) by mouth every 8 (eight) hours as needed for nausea or vomiting., Disp: 20 tablet, Rfl: 0 .  ARIPiprazole (ABILIFY) 2 MG tablet, Take 1 tablet (2 mg total) by mouth daily., Disp: 30 tablet, Rfl: 1 .  EPINEPHrine 0.3 mg/0.3 mL IJ SOAJ  injection, Inject 0.3 mLs (0.3 mg total) into the muscle as needed for anaphylaxis. (Patient not taking: Reported on 03/21/2020), Disp: 1 each, Rfl: 1 .  promethazine (PHENERGAN) 25 MG tablet, Take 0.5-1 tablets (12.5-25 mg total) by mouth every 6 (six) hours as needed for nausea or vomiting., Disp: 30 tablet, Rfl: 0 .  risperidone (RISPERDAL) 4 MG tablet, Take 1 tablet (4 mg total) by mouth at bedtime., Disp: 30 tablet, Rfl: 1 Medication Side Effects: none  Family Medical/ Social History: Changes? No  MENTAL HEALTH EXAM:  There were no vitals taken for this visit.There is no height or weight on file to calculate BMI.  General Appearance: Casual, Neat and Well Groomed  Eye Contact:  Good  Speech:  Clear and Coherent and Normal Rate  Volume:  Normal  Mood:  Depressed  Affect:  Depressed  Thought Process:  Goal Directed and Descriptions of Associations: Intact  Orientation:  Full (Time, Place, and Person)  Thought Content: Logical   Suicidal Thoughts:  No  Homicidal Thoughts:  No  Memory:  WNL  Judgement:  Good  Insight:  Good  Psychomotor Activity:  Normal  Concentration:  Concentration: Good  Recall:  Good  Fund of Knowledge: Good  Language: Good  Assets:  Desire for Improvement  ADL's:  Intact  Cognition: WNL  Prognosis:  Good   02/12/2020 CBC was normal, CMP was normal specifically glucose 66, hemoglobin A1c was 5.8, lipid panel total cholesterol 158, triglycerides 89, HDL 58, LDL 82, TSH was 2.5  DIAGNOSES:    ICD-10-CM   1. Schizoaffective disorder, depressive type (HCC)  F25.1   2. Hallucinations, unspecified  R44.3   3. Generalized anxiety disorder  F41.1   4. Major depressive disorder, recurrent episode, moderate (HCC)  F33.1   5. Insomnia due to other mental disorder  F51.05    F99     Receiving Psychotherapy: Yes     RECOMMENDATIONS:  PDMP was reviewed. I provided 45 minutes of  face-to-face time during this encounter, including time.  Reviewing hospital  records and labs. Long discussion about Invega versus Risperdal.  The Hinda Glatter has caused intolerable nausea and vomiting.  In the past when Risperdal was used, it did help with depression, and hallucinations.  It caused galactorrhea which was the only reason it was stopped.  I recommend going back to the Risperdal, even though she is having breast enlargement but no galactorrhea at this point.  We talked about adding low-dose Abilify which can prevent galactorrhea.  She would like to change to this plan. We discussed differences of bipolar 1 versus bipolar 2 and schizoaffective disorder.  All questions were answered to her satisfaction. Contract for safety is in place.  Call or go to behavioral health urgent care if suicidal thoughts recur. Discontinue Invega. Start Risperdal 4 mg 1 p.o. nightly. Continue Lamictal 125 mg nightly.  That may need to be increased but we will see what the changes are with the Risperdal. Start Abilify 2 mg, 1 p.o. every morning. Continue hydroxyzine 25 mg, 1 p.o.  3 times daily as needed anxiety. Continue counseling. Return in 4 weeks.  Melony Overly, PA-C

## 2020-03-25 DIAGNOSIS — F4312 Post-traumatic stress disorder, chronic: Secondary | ICD-10-CM | POA: Diagnosis not present

## 2020-03-25 DIAGNOSIS — F250 Schizoaffective disorder, bipolar type: Secondary | ICD-10-CM | POA: Diagnosis not present

## 2020-03-28 DIAGNOSIS — F4312 Post-traumatic stress disorder, chronic: Secondary | ICD-10-CM | POA: Diagnosis not present

## 2020-03-28 DIAGNOSIS — F250 Schizoaffective disorder, bipolar type: Secondary | ICD-10-CM | POA: Diagnosis not present

## 2020-04-01 DIAGNOSIS — F250 Schizoaffective disorder, bipolar type: Secondary | ICD-10-CM | POA: Diagnosis not present

## 2020-04-01 DIAGNOSIS — F4312 Post-traumatic stress disorder, chronic: Secondary | ICD-10-CM | POA: Diagnosis not present

## 2020-04-04 DIAGNOSIS — F4312 Post-traumatic stress disorder, chronic: Secondary | ICD-10-CM | POA: Diagnosis not present

## 2020-04-04 DIAGNOSIS — F250 Schizoaffective disorder, bipolar type: Secondary | ICD-10-CM | POA: Diagnosis not present

## 2020-04-08 DIAGNOSIS — F4310 Post-traumatic stress disorder, unspecified: Secondary | ICD-10-CM | POA: Diagnosis not present

## 2020-04-08 DIAGNOSIS — F250 Schizoaffective disorder, bipolar type: Secondary | ICD-10-CM | POA: Diagnosis not present

## 2020-04-11 DIAGNOSIS — F250 Schizoaffective disorder, bipolar type: Secondary | ICD-10-CM | POA: Diagnosis not present

## 2020-04-11 DIAGNOSIS — F4310 Post-traumatic stress disorder, unspecified: Secondary | ICD-10-CM | POA: Diagnosis not present

## 2020-04-12 ENCOUNTER — Other Ambulatory Visit: Payer: Self-pay | Admitting: Physician Assistant

## 2020-04-15 DIAGNOSIS — F4310 Post-traumatic stress disorder, unspecified: Secondary | ICD-10-CM | POA: Diagnosis not present

## 2020-04-15 DIAGNOSIS — F250 Schizoaffective disorder, bipolar type: Secondary | ICD-10-CM | POA: Diagnosis not present

## 2020-04-15 NOTE — Telephone Encounter (Signed)
90 day request

## 2020-04-18 ENCOUNTER — Encounter: Payer: Self-pay | Admitting: Family Medicine

## 2020-04-18 ENCOUNTER — Other Ambulatory Visit: Payer: Self-pay

## 2020-04-18 ENCOUNTER — Telehealth: Payer: Federal, State, Local not specified - PPO | Admitting: Family Medicine

## 2020-04-18 VITALS — Temp 98.0°F | Wt 136.0 lb

## 2020-04-18 DIAGNOSIS — F250 Schizoaffective disorder, bipolar type: Secondary | ICD-10-CM | POA: Diagnosis not present

## 2020-04-18 DIAGNOSIS — F4310 Post-traumatic stress disorder, unspecified: Secondary | ICD-10-CM | POA: Diagnosis not present

## 2020-04-18 DIAGNOSIS — K529 Noninfective gastroenteritis and colitis, unspecified: Secondary | ICD-10-CM

## 2020-04-18 MED ORDER — ONDANSETRON 8 MG PO TBDP: 8.0000 mg | ORAL_TABLET | Freq: Three times a day (TID) | ORAL | 0 refills | Status: DC | PRN

## 2020-04-18 NOTE — Progress Notes (Signed)
   Subjective:    Patient ID: Alexis [name], female    DOB: [birth year], 24 y.o.   MRN: [id number]  HPI I connected with  Alexis [name] on 04/18/20 by a video enabled telemedicine application and verified that I am speaking with the correct person using two identifiers.  Caregility used.  I am in my office.  She is at home. I discussed the limitations of evaluation and management by telemedicine. The patient expressed understanding and agreed to proceed. She and her whole family have been exposed to a viral gastroenteritis.  Her beta family except her has resolved all the issues but she is still having difficulty with nausea, vomiting, diarrhea.  She has used Phenergan with no success controlling her vomiting.  She states that when she eats anything at gets vomited up shortly after that.  Review of Systems     Objective:   Physical Exam Alert and in no distress otherwise not examined       Assessment & Plan:  Acute gastroenteritis - Plan: ondansetron (ZOFRAN ODT) 8 MG disintegrating tablet Expressed that once she has the nausea under control, take frequent sips of liquids to get better hydrated and use Imodium up to 8 pills/day to help with the diarrhea.  She will call if continued difficulty. 10 minutes spent in review of medical record evaluation and coordination of care

## 2020-04-22 DIAGNOSIS — F4310 Post-traumatic stress disorder, unspecified: Secondary | ICD-10-CM | POA: Diagnosis not present

## 2020-04-22 DIAGNOSIS — F250 Schizoaffective disorder, bipolar type: Secondary | ICD-10-CM | POA: Diagnosis not present

## 2020-04-25 DIAGNOSIS — F4310 Post-traumatic stress disorder, unspecified: Secondary | ICD-10-CM | POA: Diagnosis not present

## 2020-04-25 DIAGNOSIS — F250 Schizoaffective disorder, bipolar type: Secondary | ICD-10-CM | POA: Diagnosis not present

## 2020-04-29 ENCOUNTER — Ambulatory Visit (INDEPENDENT_AMBULATORY_CARE_PROVIDER_SITE_OTHER): Payer: Federal, State, Local not specified - PPO | Admitting: Physician Assistant

## 2020-04-29 ENCOUNTER — Encounter: Payer: Self-pay | Admitting: Physician Assistant

## 2020-04-29 ENCOUNTER — Other Ambulatory Visit: Payer: Self-pay

## 2020-04-29 DIAGNOSIS — F251 Schizoaffective disorder, depressive type: Secondary | ICD-10-CM

## 2020-04-29 DIAGNOSIS — F250 Schizoaffective disorder, bipolar type: Secondary | ICD-10-CM | POA: Diagnosis not present

## 2020-04-29 DIAGNOSIS — F99 Mental disorder, not otherwise specified: Secondary | ICD-10-CM

## 2020-04-29 DIAGNOSIS — F514 Sleep terrors [night terrors]: Secondary | ICD-10-CM | POA: Diagnosis not present

## 2020-04-29 DIAGNOSIS — F5105 Insomnia due to other mental disorder: Secondary | ICD-10-CM

## 2020-04-29 DIAGNOSIS — F4310 Post-traumatic stress disorder, unspecified: Secondary | ICD-10-CM | POA: Diagnosis not present

## 2020-04-29 DIAGNOSIS — R443 Hallucinations, unspecified: Secondary | ICD-10-CM

## 2020-04-29 MED ORDER — HYDROXYZINE HCL 25 MG PO TABS
25.0000 mg | ORAL_TABLET | Freq: Three times a day (TID) | ORAL | 5 refills | Status: DC | PRN
Start: 2020-04-29 — End: 2020-06-30

## 2020-04-29 MED ORDER — LAMOTRIGINE 25 MG PO TABS: 25.0000 mg | ORAL_TABLET | Freq: Every evening | ORAL | 0 refills | Status: DC

## 2020-04-29 MED ORDER — DULOXETINE HCL 30 MG PO CPEP: 30.0000 mg | ORAL_CAPSULE | Freq: Every day | ORAL | 1 refills | Status: DC

## 2020-04-29 MED ORDER — RISPERIDONE 3 MG PO TABS: 6.0000 mg | ORAL_TABLET | Freq: Every day | ORAL | 1 refills | Status: DC

## 2020-04-29 NOTE — Progress Notes (Signed)
[facility]  Patient ID: Alexis [name],  MRN: [id number]  PCP: Avanell Shackleton, NP-C  Date of Evaluation: 04/29/2020 Time spent:40 minutes  Chief Complaint:  Chief Complaint    Anxiety; Depression      HISTORY/CURRENT STATUS: For routine med check.  Continues to be depressed.  She is having a hard time enjoying things.  Energy and motivation are still low.  Not crying easily.  Appetite is normal.  No weight changes.  Still has trouble sleeping at times.  Her hours have been cut back at work.  She works at a [employer].  No suicidal or homicidal thoughts.  Anxiety is still a major problem.  Had a PA today that lasted 3 mins, after her boss said something about her not taking care of something with the animals. Often feels internally anxious.  Like something bad is going to happen.  Panic attacks do not happen daily but when they do they are severe.  They can last anywhere from a few minutes to a couple of hours.  She does report increased energy a day or 2 since our last meeting.  No increased libido, spending, impulsivity or risky behaviors.  No grandiosity.  She does continue to have paranoia, feels that someone is watching her all the time.  Thinks someone is in the shrubbery looking at her whether at home or at work.  She still has hallucinations with voices talking to her but it is more like chaos in her head.  She has thoughts of the grim reaper telling her to hurt herself.  She knows that the hallucinations so she does not act on it.  There are times when she will be in a sound sleep and wake up seeing Grim Reaper over her bed.  Denies cutting recently.  Denies dizziness, syncope, seizures, numbness, tingling, tremor, tics, unsteady gait, slurred speech, confusion. Denies muscle or joint pain, stiffness, or dystonia.  Individual Medical History/ Review of Systems: Changes? :No    Past medications for mental health diagnoses include: Depakote, Abilify,  Zyprexa 25, Paxil CR, Prozac, Wellbutrin, Lexapro, Cymbalta, Paxil, Zoloft, Lamictal, Vraylar, Xanax, Risperdal caused galactorrhea, [name] was too expensive, Gabapentin wasn't helpful, Mirtazepine caused nightmares, Seroquel XR 400 for 9 mos stopped DT lost response, Geodon 60 BID, Lithium 1200 briefly, Saphris, Invega, Prazosin caused syncope after 1 dose as an inpatient.   Allergies: Dopamine and Peanut-containing drug products  Current Medications:  Current Outpatient Medications:  .  ARIPiprazole (ABILIFY) 2 MG tablet, TAKE 1 TABLET BY MOUTH EVERY DAY, Disp: 90 tablet, Rfl: 1 .  EPINEPHrine 0.3 mg/0.3 mL IJ SOAJ injection, Inject 0.3 mLs (0.3 mg total) into the muscle as needed for anaphylaxis., Disp: 1 each, Rfl: 1 .  lamoTRIgine (LAMICTAL) 100 MG tablet, Take 1 tablet (100 mg total) by mouth daily. For mood stabilization, Disp: 100 tablet, Rfl: 0 .  Norethin-Eth Estrad-Fe Biphas (LO LOESTRIN FE PO), Take 1 tablet by mouth daily. , Disp: , Rfl:  .  promethazine (PHENERGAN) 25 MG tablet, Take 0.5-1 tablets (12.5-25 mg total) by mouth every 6 (six) hours as needed for nausea or vomiting., Disp: 30 tablet, Rfl: 0 .  DULoxetine (CYMBALTA) 30 MG capsule, Take 1 capsule (30 mg total) by mouth daily., Disp: 30 capsule, Rfl: 1 .  hydrOXYzine (ATARAX/VISTARIL) 25 MG tablet, Take 1 tablet (25 mg total) by mouth 3 (three) times daily as needed for anxiety., Disp: 75 tablet, Rfl: 5 .  lamoTRIgine (LAMICTAL) 25 MG tablet, Take 1  tablet (25 mg total) by mouth every evening. For mood stabilization, Disp: 30 tablet, Rfl: 0 .  ondansetron (ZOFRAN ODT) 8 MG disintegrating tablet, Take 1 tablet (8 mg total) by mouth every 8 (eight) hours as needed for nausea or vomiting. (Patient not taking: Reported on 04/29/2020), Disp: 12 tablet, Rfl: 0 .  risperiDONE (RISPERDAL) 3 MG tablet, Take 2 tablets (6 mg total) by mouth at bedtime., Disp: 60 tablet, Rfl: 1 Medication Side Effects: none specifically no galactorrhea.   States her breasts have finally stopped growing.  Family Medical/ Social History: Changes? No  MENTAL HEALTH EXAM:  There were no vitals taken for this visit.There is no height or weight on file to calculate BMI.  General Appearance: Casual, Neat and Well Groomed  Eye Contact:  Good  Speech:  Clear and Coherent and Normal Rate  Volume:  Normal  Mood:  Depressed  Affect:  Depressed  Thought Process:  Goal Directed and Descriptions of Associations: Intact  Orientation:  Full (Time, Place, and Person)  Thought Content: Logical   Suicidal Thoughts:  No  Homicidal Thoughts:  No  Memory:  WNL  Judgement:  Good  Insight:  Good  Psychomotor Activity:  Normal  Concentration:  Concentration: Good  Recall:  Good  Fund of Knowledge: Good  Language: Good  Assets:  Desire for Improvement  ADL's:  Intact  Cognition: WNL  Prognosis:  Good   02/12/2020 CBC was normal, CMP was normal specifically glucose 66, hemoglobin A1c was 5.8, lipid panel total cholesterol 158, triglycerides 89, HDL 58, LDL 82, TSH was 2.5  DIAGNOSES:    ICD-10-CM   1. Hallucinations, unspecified  R44.3   2. Schizoaffective disorder, depressive type (HCC)  F25.1   3. Insomnia due to other mental disorder  F51.05    F99   4. Night terrors  F51.4     Receiving Psychotherapy: Yes  Emmit Pomfret, [facility]   RECOMMENDATIONS:  PDMP was reviewed. I provided 40 minutes of face-to-face time during this encounter, discussing the side effect of galactorrhea and breast enlargement.  Since we added the Abilify, the galactorrhea has not recurred.  We also discussed the worsening of hallucinations and paranoia.  We could increase either the Abilify or the Risperdal.  She tells me that her counselor has recommended she get a second opinion and she has an appointment with another psychiatrist next week.  Even though she will go to that appointment, she would like for [place] to make some change today, hoping to feel better by  next week. Increase Risperdal to a total of 6 mg daily. Continue Lamictal 125 mg nightly.   Continue Cymbalta 30 mg, 1 p.o. daily. Continue Abilify 2 mg, 1 p.o. every morning. Continue hydroxyzine 25 mg, 1 p.o. 3 times daily as needed anxiety. I think it is a good thing for her to have a second opinion.  If she does want to return here for continuous care, that is fine. Continue counseling. Return in prn.   Melony Overly, PA-C

## 2020-05-02 DIAGNOSIS — F4310 Post-traumatic stress disorder, unspecified: Secondary | ICD-10-CM | POA: Diagnosis not present

## 2020-05-02 DIAGNOSIS — F250 Schizoaffective disorder, bipolar type: Secondary | ICD-10-CM | POA: Diagnosis not present

## 2020-05-06 DIAGNOSIS — F319 Bipolar disorder, unspecified: Secondary | ICD-10-CM | POA: Diagnosis not present

## 2020-05-06 DIAGNOSIS — F4310 Post-traumatic stress disorder, unspecified: Secondary | ICD-10-CM | POA: Diagnosis not present

## 2020-05-06 DIAGNOSIS — F259 Schizoaffective disorder, unspecified: Secondary | ICD-10-CM | POA: Diagnosis not present

## 2020-05-06 DIAGNOSIS — F250 Schizoaffective disorder, bipolar type: Secondary | ICD-10-CM | POA: Diagnosis not present

## 2020-05-09 DIAGNOSIS — F250 Schizoaffective disorder, bipolar type: Secondary | ICD-10-CM | POA: Diagnosis not present

## 2020-05-09 DIAGNOSIS — F4310 Post-traumatic stress disorder, unspecified: Secondary | ICD-10-CM | POA: Diagnosis not present

## 2020-05-12 ENCOUNTER — Telehealth: Payer: Self-pay | Admitting: Psychiatry

## 2020-05-12 NOTE — Telephone Encounter (Signed)
RTC  Pt started duloxetine 30 for depression and increased risperidone from 4 mg to 6 mg daily for disturbing hallucinations. She called complaining of tremors and her sister who is a Surveyor, mining has noted some facial tics of some sort.  She went for a second psychiatric opinion recently.  Apparently that psychiatrist had recommended a return to [name] and/or [name] which she had apparently taken in in the past but then became too expensive.  She indicates her insurance has not changed but wants to try to switch to those medications again.  Was explained to her that generally they are not used together but Alexis [name] would be a reasonable choice if it is can be obtained at an affordable price.  Either 1 of those medications may be less likely to cause tremor and potential tardive dyskinesia than the risperidone however the risperidone is typically a very effective medication and is generic.  Given that she will not get benefit from duloxetine for at least a couple of weeks and she reports the hallucinations are a little better after increasing the risperidone.  And also given that duloxetine is a mild 2 D6 inhibitor which could increase the blood level of risperidone and thus accentuated side effects it was suggested that she stop the duloxetine and continue the risperidone 6 mg daily.  Once the office is open back up an effort could be made to see if for some reason Alexis [name] is now more affordable with her current insurance then it was previously.  She agreed to this plan.

## 2020-05-13 ENCOUNTER — Other Ambulatory Visit: Payer: Self-pay | Admitting: Physician Assistant

## 2020-05-13 DIAGNOSIS — F4310 Post-traumatic stress disorder, unspecified: Secondary | ICD-10-CM | POA: Diagnosis not present

## 2020-05-13 DIAGNOSIS — F250 Schizoaffective disorder, bipolar type: Secondary | ICD-10-CM | POA: Diagnosis not present

## 2020-05-13 MED ORDER — LAMOTRIGINE 25 MG PO TABS: 50.0000 mg | ORAL_TABLET | Freq: Every evening | ORAL | 0 refills | Status: DC

## 2020-05-13 MED ORDER — RISPERIDONE 3 MG PO TABS: 4.5000 mg | ORAL_TABLET | Freq: Every day | ORAL | 1 refills | Status: DC

## 2020-05-13 NOTE — Telephone Encounter (Signed)
I called patient to discuss the possibility of going back on Latuda, versus Risperdal.  There was no answer, voicemail was identified that this is Alexis [name]'s number.  I left a message stating that I had called and will try her again later on.

## 2020-05-13 NOTE — Telephone Encounter (Signed)
I returned patient's call.  Alexis [name] states that immediately after increasing the Risperdal to a total of 6 mg that she started having a tremor of her face and it will not go away.  See previous note per Dr. Jennelle Human.  She had already stopped the Cymbalta.  She is still on Risperdal 6 mg.  She has gone for a second opinion just a week or so ago and they recommended Latuda.  They did not give her samples or have her get started until they were trying to find out if her insurance would pay for the Latuda.  In the meantime, patient still has depression and auditory hallucinations.  We agreed for her to increase the Lamictal to a total of 150 mg daily and decrease the Risperdal to a total of 4.5 mg nightly.  She has a follow-up appointment with the provider she is getting a second opinion from, next month.  Alexis [name] will let me know if she needs anything prior to that time.

## 2020-05-16 DIAGNOSIS — F250 Schizoaffective disorder, bipolar type: Secondary | ICD-10-CM | POA: Diagnosis not present

## 2020-05-16 DIAGNOSIS — F4310 Post-traumatic stress disorder, unspecified: Secondary | ICD-10-CM | POA: Diagnosis not present

## 2020-05-20 DIAGNOSIS — F4310 Post-traumatic stress disorder, unspecified: Secondary | ICD-10-CM | POA: Diagnosis not present

## 2020-05-20 DIAGNOSIS — F250 Schizoaffective disorder, bipolar type: Secondary | ICD-10-CM | POA: Diagnosis not present

## 2020-05-21 ENCOUNTER — Other Ambulatory Visit: Payer: Self-pay | Admitting: Physician Assistant

## 2020-05-23 DIAGNOSIS — F4310 Post-traumatic stress disorder, unspecified: Secondary | ICD-10-CM | POA: Diagnosis not present

## 2020-05-23 DIAGNOSIS — F250 Schizoaffective disorder, bipolar type: Secondary | ICD-10-CM | POA: Diagnosis not present

## 2020-05-28 DIAGNOSIS — F259 Schizoaffective disorder, unspecified: Secondary | ICD-10-CM | POA: Diagnosis not present

## 2020-05-31 DIAGNOSIS — F250 Schizoaffective disorder, bipolar type: Secondary | ICD-10-CM | POA: Diagnosis not present

## 2020-05-31 DIAGNOSIS — F4310 Post-traumatic stress disorder, unspecified: Secondary | ICD-10-CM | POA: Diagnosis not present

## 2020-06-03 DIAGNOSIS — F250 Schizoaffective disorder, bipolar type: Secondary | ICD-10-CM | POA: Diagnosis not present

## 2020-06-03 DIAGNOSIS — F4310 Post-traumatic stress disorder, unspecified: Secondary | ICD-10-CM | POA: Diagnosis not present

## 2020-06-05 DIAGNOSIS — F4310 Post-traumatic stress disorder, unspecified: Secondary | ICD-10-CM | POA: Diagnosis not present

## 2020-06-05 DIAGNOSIS — F250 Schizoaffective disorder, bipolar type: Secondary | ICD-10-CM | POA: Diagnosis not present

## 2020-06-10 DIAGNOSIS — F250 Schizoaffective disorder, bipolar type: Secondary | ICD-10-CM | POA: Diagnosis not present

## 2020-06-10 DIAGNOSIS — F4310 Post-traumatic stress disorder, unspecified: Secondary | ICD-10-CM | POA: Diagnosis not present

## 2020-06-14 DIAGNOSIS — F4310 Post-traumatic stress disorder, unspecified: Secondary | ICD-10-CM | POA: Diagnosis not present

## 2020-06-14 DIAGNOSIS — F250 Schizoaffective disorder, bipolar type: Secondary | ICD-10-CM | POA: Diagnosis not present

## 2020-06-16 ENCOUNTER — Ambulatory Visit: Payer: Federal, State, Local not specified - PPO | Admitting: Physician Assistant

## 2020-06-17 DIAGNOSIS — F4310 Post-traumatic stress disorder, unspecified: Secondary | ICD-10-CM | POA: Diagnosis not present

## 2020-06-17 DIAGNOSIS — F250 Schizoaffective disorder, bipolar type: Secondary | ICD-10-CM | POA: Diagnosis not present

## 2020-06-18 DIAGNOSIS — F259 Schizoaffective disorder, unspecified: Secondary | ICD-10-CM | POA: Diagnosis not present

## 2020-06-19 ENCOUNTER — Other Ambulatory Visit: Payer: Self-pay | Admitting: Physician Assistant

## 2020-06-21 DIAGNOSIS — F4310 Post-traumatic stress disorder, unspecified: Secondary | ICD-10-CM | POA: Diagnosis not present

## 2020-06-21 DIAGNOSIS — F250 Schizoaffective disorder, bipolar type: Secondary | ICD-10-CM | POA: Diagnosis not present

## 2020-06-24 DIAGNOSIS — F4310 Post-traumatic stress disorder, unspecified: Secondary | ICD-10-CM | POA: Diagnosis not present

## 2020-06-24 DIAGNOSIS — F250 Schizoaffective disorder, bipolar type: Secondary | ICD-10-CM | POA: Diagnosis not present

## 2020-06-28 DIAGNOSIS — F4310 Post-traumatic stress disorder, unspecified: Secondary | ICD-10-CM | POA: Diagnosis not present

## 2020-06-28 DIAGNOSIS — F250 Schizoaffective disorder, bipolar type: Secondary | ICD-10-CM | POA: Diagnosis not present

## 2020-06-30 ENCOUNTER — Other Ambulatory Visit: Payer: Self-pay

## 2020-06-30 ENCOUNTER — Ambulatory Visit: Payer: Federal, State, Local not specified - PPO | Admitting: Neurology

## 2020-06-30 ENCOUNTER — Encounter: Payer: Self-pay | Admitting: Neurology

## 2020-06-30 VITALS — BP 102/62 | HR 62 | Ht 65.0 in | Wt 133.0 lb

## 2020-06-30 DIAGNOSIS — Z79899 Other long term (current) drug therapy: Secondary | ICD-10-CM

## 2020-06-30 DIAGNOSIS — Z9889 Other specified postprocedural states: Secondary | ICD-10-CM

## 2020-06-30 DIAGNOSIS — G43709 Chronic migraine without aura, not intractable, without status migrainosus: Secondary | ICD-10-CM | POA: Diagnosis not present

## 2020-06-30 DIAGNOSIS — F259 Schizoaffective disorder, unspecified: Secondary | ICD-10-CM

## 2020-06-30 MED ORDER — ONDANSETRON 4 MG PO TBDP: 4.0000 mg | ORAL_TABLET | Freq: Three times a day (TID) | ORAL | 6 refills | Status: DC | PRN

## 2020-06-30 MED ORDER — AIMOVIG 70 MG/ML ~~LOC~~ SOAJ: 70.0000 mg | SUBCUTANEOUS | 11 refills | Status: DC

## 2020-06-30 MED ORDER — RIZATRIPTAN BENZOATE 5 MG PO TBDP: 5.0000 mg | ORAL_TABLET | ORAL | 12 refills | Status: DC | PRN

## 2020-06-30 NOTE — Progress Notes (Signed)
Chief Complaint  Patient presents with  . New Patient (Initial Visit)    Rm 16 alone- Here for reval on daily h/a. Pt would like to discuss meds to help treat. Typically will take otc meds to treat h/a unsure if she has been on any prescription meds in the past    HISTORICAL  Alexis [name] is a 25 year old female, seen in request by her primary care nurse practitioner Hetty Blend for evaluation of chronic migraine headache.  I reviewed and summarized the referring note.  Past medical history Tetralogy of Fallot, status post multiple heart surgery in the past Schizoaffective disorder, admission in September 2021 for worsening depression,  I saw her previously in 2017 for chronic migraine headache, she reported a history of migraine headaches since middle school, her typical migraine a lateralized severe pounding headache with associated light noise sensitivity, lasting for hours to days, she could not function during migraine, she was put on Depakote as preventive medication, reported significant improvement of her headache,  She currently works as a Metallurgist, reported increased migraine headaches since 2021, at least once a week, severe migraine headache, with nausea, lasting for 1 day, sometimes relieved by over-the-counter Advil, but oftentimes is not as effective,  I personally reviewed MRI of the brain in 2017, there was no significant abnormality  Laboratory evaluations in September 2021: Normal TSH, lipid panel, A1c 5.8, CMP, CBC hemoglobin 14.9  Hospital admission for worsening schizoaffective versus bipolar and posttraumatic stress disorder, currently she is treated with lamotrigine 125 mg daily, she reported suboptimal control of her mood disorder, still feel depressed  She denied lateralized motor or sensory deficit,  REVIEW OF SYSTEMS: Full 14 system review of systems performed and notable only for as above All other review of systems were  negative.  ALLERGIES: Allergies  Allergen Reactions  . Dopamine     Makes WBC rise  . Peanut-Containing Drug Products     Hazel nuts [place] nuts    HOME MEDICATIONS: Current Outpatient Medications  Medication Sig Dispense Refill  . EPINEPHrine 0.3 mg/0.3 mL IJ SOAJ injection Inject 0.3 mLs (0.3 mg total) into the muscle as needed for anaphylaxis. 1 each 1  . lamoTRIgine (LAMICTAL) 100 MG tablet Take 1 tablet (100 mg total) by mouth daily. For mood stabilization 100 tablet 0  . lamoTRIgine (LAMICTAL) 25 MG tablet Take 2 tablets (50 mg total) by mouth every evening. For mood stabilization 60 tablet 0  . Norethin-Eth Estrad-Fe Biphas (LO LOESTRIN FE PO) Take 1 tablet by mouth daily.     Marland Kitchen [name] 1 MG TABS tablet Take 1 mg by mouth daily.    . risperiDONE (RISPERDAL) 3 MG tablet Take 1.5 tablets (4.5 mg total) by mouth at bedtime. 60 tablet 1   No current facility-administered medications for this visit.    PAST MEDICAL HISTORY: Past Medical History:  Diagnosis Date  . Allergy   . Auditory hallucination   . Bipolar disorder (HCC)   . Deliberate self-cutting   . GAD (generalized anxiety disorder)   . Heart disease   . Incomplete RBBB 01/2019   noted on EKG from [facility]  . Migraines   . Right ovarian cyst 02/20/2019   3.7 cm right ovarian cyst.  . Schizoaffective disorder (HCC)   . Tetralogy of Fallot     PAST SURGICAL HISTORY: Past Surgical History:  Procedure Laterality Date  . BIOPSY  03/08/2019   Procedure: BIOPSY;  Surgeon: Jeani Hawking, MD;  Location: [facility];  Service: Endoscopy;;  . CARDIAC SURGERY    . CARDIAC SURGERY     4 open heart surgeries  . ESOPHAGOGASTRODUODENOSCOPY (EGD) WITH PROPOFOL N/A 03/08/2019   Procedure: ESOPHAGOGASTRODUODENOSCOPY (EGD) WITH PROPOFOL;  Surgeon: Jeani Hawking, MD;  Location: [facility];  Service: Endoscopy;  Laterality: N/A;  . GASTROSTOMY W/ FEEDING TUBE     removed 1 year ago   . THORACIC DUCT LIGATION      FAMILY  HISTORY: Family History  Problem Relation Age of Onset  . Hypertension Maternal Grandmother   . Diabetes Maternal Grandfather   . Heart disease Maternal Grandfather   . Stroke Maternal Grandfather   . Hypertension Mother   . Healthy Father     SOCIAL HISTORY: Social History   Socioeconomic History  . Marital status: Single    Spouse name: Not on file  . Number of children: 0  . Years of education: College  . Highest education level: Not on file  Occupational History  . Occupation: Consulting civil engineer  Tobacco Use  . Smoking status: Never Smoker  . Smokeless tobacco: Never Used  Vaping Use  . Vaping Use: Never used  Substance and Sexual Activity  . Alcohol use: Yes    Comment: occas  . Drug use: Not Currently    Types: Marijuana    Comment: Occas.  Hemp  . Sexual activity: Not Currently    Partners: Male  Other Topics Concern  . Not on file  Social History Narrative   Lives at home with mother.   Right-handed.   No more than 2 cups caffeine per day.      Works at [employer]   Social Determinants of Health   Financial Resource Strain: Not on file  Food Insecurity: Not on file  Transportation Needs: Not on file  Physical Activity: Not on file  Stress: Not on file  Social Connections: Not on file  Intimate Partner Violence: Not on file     PHYSICAL EXAM   Vitals:   06/30/20 0732  BP: 102/62  Pulse: 62  SpO2: 98%  Weight: 133 lb (60.3 kg)  Height: 5\' 5"  (1.651 m)   Not recorded     Body mass index is 22.13 kg/m.  PHYSICAL EXAMNIATION:  Gen: NAD, conversant, well nourised, well groomed                     Cardiovascular: Regular rate rhythm, no peripheral edema, warm, nontender. Eyes: Conjunctivae clear without exudates or hemorrhage Neck: Supple, no carotid bruits. Pulmonary: Clear to auscultation bilaterally   NEUROLOGICAL EXAM:  MENTAL STATUS: Speech:    Speech is normal; fluent and spontaneous with normal comprehension.   Cognition:     Orientation to time, place and person     Normal recent and remote memory     Normal Attention span and concentration     Normal Language, naming, repeating,spontaneous speech     Fund of knowledge   CRANIAL NERVES: CN II: Visual fields are full to confrontation. Pupils are round equal and briskly reactive to light. CN III, IV, VI: extraocular movement are normal. No ptosis. CN V: Facial sensation is intact to light touch CN VII: Face is symmetric with normal eye closure  CN VIII: Hearing is normal to causal conversation. CN IX, X: Phonation is normal. CN XI: Head turning and shoulder shrug are intact  MOTOR: There is no pronator drift of out-stretched arms. Muscle bulk and tone are normal. Muscle strength is normal.  REFLEXES: Reflexes are 2+ and symmetric at the biceps, triceps, knees, and ankles. Plantar responses are flexor.  SENSORY: Intact to light touch, pinprick and vibratory sensation are intact in fingers and toes.  COORDINATION: There is no trunk or limb dysmetria noted.  GAIT/STANCE: Posture is normal. Gait is steady with normal steps, base, arm swing, and turning. Heel and toe walking are normal. Tandem gait is normal.  Romberg is absent.   DIAGNOSTIC DATA (LABS, IMAGING, TESTING) - I reviewed patient records, labs, notes, testing and imaging myself where available.   ASSESSMENT AND PLAN  Alexis [name] is a 25 y.o. female   Chronic migraine headache Schizoaffective disorder Polypharmacy treatment History of tetralogy of Fallot, multiple open heart surgery in the past  She is currently on lamotrigine for her mood disorder,  Previously tried Inderal beta-blocker as preventive medication without helping her headache,  Also tried and failed multiple antidepression in the past, including Vraylar, Seroquel, Risperdal, Geodon, Latuda, lithium, Zyprexa, Saphris.  Will try aimovig 70 mg every month as preventive medication  Maxalt 5 mg dissolvable  as needed plus Zofran as needed May combine with Aleve for moderate to severe headaches  Return to clinic with nurse practitioner Sarah 2 to 3 months   Levert Feinstein, M.D. Ph.D.  [facility] [address] [place], [place] [address] Ph: [phone] Fax: [phone]  CC:  Avanell Shackleton, NP-C [address],  [place] [address]

## 2020-06-30 NOTE — Patient Instructions (Signed)
During severe headaches,  You may start with Maxalt 5 mg as needed, plus Zofran 4 mg as needed for nausea, may combine with Aleve 1 to 2 tablets as needed,

## 2020-07-01 ENCOUNTER — Other Ambulatory Visit: Payer: Self-pay | Admitting: *Deleted

## 2020-07-01 ENCOUNTER — Telehealth: Payer: Self-pay | Admitting: *Deleted

## 2020-07-01 DIAGNOSIS — F250 Schizoaffective disorder, bipolar type: Secondary | ICD-10-CM | POA: Diagnosis not present

## 2020-07-01 DIAGNOSIS — F4310 Post-traumatic stress disorder, unspecified: Secondary | ICD-10-CM | POA: Diagnosis not present

## 2020-07-01 MED ORDER — AIMOVIG 70 MG/ML ~~LOC~~ SOAJ: 70.0000 mg | SUBCUTANEOUS | 11 refills | Status: DC

## 2020-07-01 NOTE — Telephone Encounter (Signed)
Last OV note attached to PA. Submitted and waiting on determination.

## 2020-07-01 NOTE — Telephone Encounter (Signed)
PA Aimovig initiated on CMM. Key: [id number]. In process of completing.

## 2020-07-02 NOTE — Telephone Encounter (Signed)
PA approved effective 06/01/2020 through 12/28/2020.

## 2020-07-05 DIAGNOSIS — F250 Schizoaffective disorder, bipolar type: Secondary | ICD-10-CM | POA: Diagnosis not present

## 2020-07-05 DIAGNOSIS — F4310 Post-traumatic stress disorder, unspecified: Secondary | ICD-10-CM | POA: Diagnosis not present

## 2020-07-08 DIAGNOSIS — F4310 Post-traumatic stress disorder, unspecified: Secondary | ICD-10-CM | POA: Diagnosis not present

## 2020-07-08 DIAGNOSIS — F250 Schizoaffective disorder, bipolar type: Secondary | ICD-10-CM | POA: Diagnosis not present

## 2020-07-09 DIAGNOSIS — F259 Schizoaffective disorder, unspecified: Secondary | ICD-10-CM | POA: Diagnosis not present

## 2020-07-12 DIAGNOSIS — F4310 Post-traumatic stress disorder, unspecified: Secondary | ICD-10-CM | POA: Diagnosis not present

## 2020-07-12 DIAGNOSIS — F250 Schizoaffective disorder, bipolar type: Secondary | ICD-10-CM | POA: Diagnosis not present

## 2020-07-13 ENCOUNTER — Other Ambulatory Visit: Payer: Self-pay

## 2020-07-13 ENCOUNTER — Ambulatory Visit (HOSPITAL_COMMUNITY)
Admission: EM | Admit: 2020-07-13 | Discharge: 2020-07-14 | Disposition: A | Discharge status: Home / Self Care | Payer: Federal, State, Local not specified - PPO | Attending: Family | Admitting: Family

## 2020-07-13 DIAGNOSIS — F250 Schizoaffective disorder, bipolar type: Secondary | ICD-10-CM | POA: Diagnosis not present

## 2020-07-13 DIAGNOSIS — Z20822 Contact with and (suspected) exposure to covid-19: Secondary | ICD-10-CM | POA: Diagnosis not present

## 2020-07-13 DIAGNOSIS — F259 Schizoaffective disorder, unspecified: Secondary | ICD-10-CM

## 2020-07-13 DIAGNOSIS — F603 Borderline personality disorder: Secondary | ICD-10-CM | POA: Insufficient documentation

## 2020-07-13 LAB — POCT URINE DRUG SCREEN - MANUAL ENTRY (I-SCREEN)
POC Amphetamine UR: NOT DETECTED
POC Buprenorphine (BUP): NOT DETECTED
POC Cocaine UR: NOT DETECTED
POC Marijuana UR: NOT DETECTED
POC Methadone UR: NOT DETECTED
POC Methamphetamine UR: NOT DETECTED
POC Morphine: NOT DETECTED
POC Oxazepam (BZO): NOT DETECTED
POC Oxycodone UR: NOT DETECTED
POC Secobarbital (BAR): NOT DETECTED

## 2020-07-13 LAB — RESP PANEL BY RT-PCR (FLU A&B, COVID) ARPGX2
Influenza A by PCR: NEGATIVE
Influenza B by PCR: NEGATIVE
SARS Coronavirus 2 by RT PCR: NEGATIVE

## 2020-07-13 LAB — POC SARS CORONAVIRUS 2 AG -  ED: SARS Coronavirus 2 Ag: NEGATIVE

## 2020-07-13 MED ORDER — HYDROXYZINE HCL 25 MG PO TABS
25.0000 mg | ORAL_TABLET | Freq: Three times a day (TID) | ORAL | Status: DC | PRN
  Administered 2020-07-13 (×2): 25 mg via ORAL
  Filled 2020-07-13 (×2): qty 1

## 2020-07-13 MED ORDER — LAMOTRIGINE 150 MG PO TABS
ORAL_TABLET | ORAL | Status: AC
  Administered 2020-07-13: 150 mg via ORAL
  Filled 2020-07-13: qty 1

## 2020-07-13 MED ORDER — ALUM & MAG HYDROXIDE-SIMETH 200-200-20 MG/5ML PO SUSP: 30.0000 mL | ORAL | Status: DC | PRN

## 2020-07-13 MED ORDER — MAGNESIUM HYDROXIDE 400 MG/5ML PO SUSP: 30.0000 mL | Freq: Every day | ORAL | Status: DC | PRN

## 2020-07-13 MED ORDER — RISPERIDONE 2 MG PO TBDP
ORAL_TABLET | ORAL | Status: AC
  Administered 2020-07-13: 2 mg
  Filled 2020-07-13: qty 1

## 2020-07-13 MED ORDER — RISPERIDONE 1 MG PO TBDP
ORAL_TABLET | ORAL | Status: AC
  Administered 2020-07-13: 1 mg
  Filled 2020-07-13: qty 1

## 2020-07-13 MED ORDER — RISPERIDONE 2 MG PO TBDP
3.0000 mg | ORAL_TABLET | Freq: Every day | ORAL | Status: DC
  Administered 2020-07-13: 3 mg via ORAL
  Filled 2020-07-13: qty 1

## 2020-07-13 MED ORDER — LAMOTRIGINE 150 MG PO TABS
150.0000 mg | ORAL_TABLET | Freq: Every day | ORAL | Status: DC
  Administered 2020-07-14: 150 mg via ORAL
  Filled 2020-07-13: qty 1

## 2020-07-13 MED ORDER — RISPERIDONE 2 MG PO TBDP: 3.0000 mg | ORAL_TABLET | Freq: Once | ORAL | Status: DC

## 2020-07-13 MED ORDER — BREXPIPRAZOLE 2 MG PO TABS
2.0000 mg | ORAL_TABLET | Freq: Every day | ORAL | Status: DC
  Administered 2020-07-14: 2 mg via ORAL
  Filled 2020-07-13 (×5): qty 1

## 2020-07-13 MED ORDER — LAMOTRIGINE 150 MG PO TABS: 150.0000 mg | ORAL_TABLET | Freq: Once | ORAL | Status: DC

## 2020-07-13 MED ORDER — ACETAMINOPHEN 325 MG PO TABS: 650.0000 mg | ORAL_TABLET | Freq: Four times a day (QID) | ORAL | Status: DC | PRN

## 2020-07-13 NOTE — ED Notes (Addendum)
Pt alert and oriented during [facility] admission process. Pt denies SI/HI, A/VH, and any pain. Pt is cooperative. Education, support, reassurance, and encouragement provided. Pt's belongings in locker. Pt denies any concerns at this time, and verbally contracts for safety. Pt ambulating on the unit with no issues. Pt remains safe on the unit.  

## 2020-07-13 NOTE — ED Notes (Signed)
Sandwich and bar given
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
## 2020-07-13 NOTE — ED Notes (Signed)
Pt asleep in bed. Respirations even and unlabored. Will continue to monitor for safety. ?

## 2020-07-13 NOTE — ED Notes (Signed)
LOCKER #[phone]

## 2020-07-13 NOTE — ED Provider Notes (Signed)
Behavioral Health Admission [facility]&P [facility] & OBS)  Date: 07/13/20 Patient Name: Alexis [name] MRN: [id number] Chief Complaint: No chief complaint on file.     Diagnoses:  Final diagnoses:  Schizoaffective disorder, bipolar type [facility])    HPI: Patient presents voluntarily to [facility] for walk-in assessment.  Patient reports she was referred by outpatient psychiatry.  Patient states "I feel super depressed all the time."  Patient reports she last felt suicidal 10 days ago.  Patient denies suicidal ideations currently.  Patient endorses 1 prior suicide attempt in September 2021.  Patient endorses history of self-harm behaviors.  Patient endorses she last cut herself in September 2021.  Patient reports she does have thoughts to cut herself but uses coping skills learned in DBT therapy to overcome these feelings.  Patient unable to contract for safety with this Clinical research associate at this time.  Patient states "if I leave I am not sure what I would do."   Patient reports worsening hallucinations x2 months.  Patient endorses auditory hallucinations "that tell me I am worthless or cut myself."  Patient endorses visual hallucinations of "something from a horror movie."  Patient reports recent stressor includes having to put her dog down on June 30, 2020.  Additionally patient feels overwhelmed by her schizoaffective disorder diagnosis.  Patient reports she feels that diagnosis of schizoaffective disorder has "taken over my life."  Patient reports feeling anxiety surrounding "I do not know when the hallucinations will come."  Patient endorses decreased appetite.  Patient states she has lost 5 pounds in 1 week related to decreased appetite.  [facility] psychiatrist Dr. Vicente Masson.  Patient is seen by [facility] counseling 3 times per week.  Patient is currently enrolled in DBT therapy.  Patient plans to attend both intensive  outpatient and partial hospitalization through [facility] in [place], begins in approximately 1 week.  Patient endorses compliance with current medications including Lamictal, Risperdal, and [name].  Patient believes increased symptoms could stem from DBT therapy but believes that this therapy is necessary.  Patient resides in [place] with her mother, stepfather and grandmother.  Patient reports feeling supported in her home.  Patient denies access to weapons.  Patient is employed as a Production designer, theatre/television/film.  Patient denies alcohol and substance use.  Patient endorses average sleep.  Patient assessed by nurse practitioner.  Patient alert and oriented, answers appropriately.  Patient pleasant and cooperative during assessment.  Patient insightful, reports that she would like to feel better.  There is no indication that patient is responding to internal stimuli.  Patient offered support and encouragement.   PHQ 2-9:  Flowsheet Row Office Visit from 11/01/2019 in [facility]  Thoughts that you would be better off dead, or of hurting yourself in some way Several days  [not in the last two weeks]  PHQ-9 Total Score 18      Flowsheet Row [facility] Office Visit from 11/01/2019 in [facility]  C-SSRS RISK CATEGORY No Risk Error: Q3, 4, or 5 should not be populated when Q2 is No       Total Time spent with patient: 30 minutes  Musculoskeletal  Strength & Muscle Tone: within normal limits Gait & Station: normal Patient leans: Right  Psychiatric Specialty Exam  Presentation General Appearance: Appropriate for Environment; Casual  Eye Contact:Good  Speech:Clear and Coherent; Normal Rate  Speech Volume:Normal  Handedness:Right   Mood  and Affect  Mood:Depressed  Affect:Depressed   Thought Process  Thought Processes:Coherent; Goal Directed  Descriptions of  Associations:Intact  Orientation:Full (Time, Place and Person)  Thought Content:Logical   Hallucinations:Hallucinations: Auditory; Command Description of Command Hallucinations: Patient reports auditory hallucinations, command in nature.  "I hear voices that I am worthless or into my life or to cut." Description of Auditory Hallucinations: Command hallucinations  Ideas of Reference:None  Suicidal Thoughts:Suicidal Thoughts: Yes, Passive SI Passive Intent and/or Plan: Without Intent; Without Plan  Homicidal Thoughts:Homicidal Thoughts: No   Sensorium  Memory:Immediate Good; Recent Good; Remote Good  Judgment:Fair  Insight:Fair   Executive Functions  Concentration:Good  Attention Span:Good  Recall:Good  Fund of Knowledge:Good  Language:Good   Psychomotor Activity  Psychomotor Activity:Psychomotor Activity: Normal   Assets  Assets:Communication Skills; Desire for Improvement; Financial Resources/Insurance; Housing; Intimacy; Leisure Time; Physical Health; Resilience; Social Support; Talents/Skills; Transportation; Vocational/Educational   Sleep  Sleep:Sleep: Fair   Nutritional Assessment (For OBS and [facility] admissions only) Has the patient had a weight loss or gain of 10 pounds or more in the last 3 months?: Yes Has the patient had a decrease in food intake/or appetite?: Yes Does the patient have dental problems?: No Does the patient have eating habits or behaviors that may be indicators of an eating disorder including binging or inducing vomiting?: Yes Has the patient recently lost weight without trying?: Yes, 2-13 lbs. Has the patient been eating poorly because of a decreased appetite?: Yes Malnutrition Screening Tool Score: 2    Physical Exam Vitals and nursing note reviewed.  Constitutional:      Appearance: She is well-developed.  HENT:     Head: Normocephalic.  Cardiovascular:     Rate and Rhythm: Normal rate.  Pulmonary:     Effort: Pulmonary  effort is normal.  Neurological:     Mental Status: She is alert and oriented to person, place, and time.  Psychiatric:        Attention and Perception: She perceives auditory and visual hallucinations.        Mood and Affect: Affect normal. Mood is depressed.        Speech: Speech normal.        Behavior: Behavior normal. Behavior is cooperative.        Cognition and Memory: Cognition and memory normal.        Judgment: Judgment normal.    Review of Systems  Constitutional: Negative.   HENT: Negative.   Eyes: Negative.   Respiratory: Negative.   Cardiovascular: Negative.   Gastrointestinal: Negative.   Genitourinary: Negative.   Musculoskeletal: Negative.   Skin: Negative.   Neurological: Negative.   Endo/Heme/Allergies: Negative.   Psychiatric/Behavioral: Positive for depression and hallucinations.    Blood pressure 129/69, pulse 82, temperature 97.7 F (36.5 C), temperature source Oral, resp. rate 18, SpO2 100 %. There is no height or weight on file to calculate BMI.  Past Psychiatric History: Schizoaffective disorder, adjustment disorder, bipolar 1 disorder, generalized anxiety disorder, major depressive disorder  Is the patient at risk to self? Yes  Has the patient been a risk to self in the past 6 months? Yes .    Has the patient been a risk to self within the distant past? Yes   Is the patient a risk to others? No   Has the patient been a risk to others in the past 6 months? No   Has the patient been a risk to others within the distant past? No   Past Medical  History:  Past Medical History:  Diagnosis Date  . Allergy   . Auditory hallucination   . Bipolar disorder (HCC)   . Deliberate self-cutting   . GAD (generalized anxiety disorder)   . Heart disease   . Incomplete RBBB 01/2019   noted on EKG from [facility]  . Migraines   . Right ovarian cyst 02/20/2019   3.7 cm right ovarian cyst.  . Schizoaffective disorder (HCC)   . Tetralogy of Fallot     Past Surgical  History:  Procedure Laterality Date  . BIOPSY  03/08/2019   Procedure: BIOPSY;  Surgeon: Jeani Hawking, MD;  Location: [facility] ENDOSCOPY;  Service: Endoscopy;;  . CARDIAC SURGERY    . CARDIAC SURGERY     4 open heart surgeries  . ESOPHAGOGASTRODUODENOSCOPY (EGD) WITH PROPOFOL N/A 03/08/2019   Procedure: ESOPHAGOGASTRODUODENOSCOPY (EGD) WITH PROPOFOL;  Surgeon: Jeani Hawking, MD;  Location: [facility] ENDOSCOPY;  Service: Endoscopy;  Laterality: N/A;  . GASTROSTOMY W/ FEEDING TUBE     removed 1 year ago   . THORACIC DUCT LIGATION      Family History:  Family History  Problem Relation Age of Onset  . Hypertension Maternal Grandmother   . Diabetes Maternal Grandfather   . Heart disease Maternal Grandfather   . Stroke Maternal Grandfather   . Hypertension Mother   . Healthy Father     Social History:  Social History   Socioeconomic History  . Marital status: Single    Spouse name: Not on file  . Number of children: 0  . Years of education: College  . Highest education level: Not on file  Occupational History  . Occupation: Consulting civil engineer  Tobacco Use  . Smoking status: Never Smoker  . Smokeless tobacco: Never Used  Vaping Use  . Vaping Use: Never used  Substance and Sexual Activity  . Alcohol use: Yes    Comment: occas  . Drug use: Not Currently    Types: Marijuana    Comment: Occas.  Hemp  . Sexual activity: Not Currently    Partners: Male  Other Topics Concern  . Not on file  Social History Narrative   Lives at home with mother.   Right-handed.   No more than 2 cups caffeine per day.      Works at [employer]   Social Determinants of Health   Financial Resource Strain: Not on file  Food Insecurity: Not on file  Transportation Needs: Not on file  Physical Activity: Not on file  Stress: Not on file  Social Connections: Not on file  Intimate Partner Violence: Not on file    SDOH:  SDOH Screenings   Alcohol Screen: Low Risk   . Last Alcohol Screening Score  (AUDIT): 3  Depression (PHQ2-9): Medium Risk  . PHQ-2 Score: 18  Financial Resource Strain: Not on file  Food Insecurity: Not on file  Housing: Not on file  Physical Activity: Not on file  Social Connections: Not on file  Stress: Not on file  Tobacco Use: Low Risk   . Smoking Tobacco Use: Never Smoker  . Smokeless Tobacco Use: Never Used  Transportation Needs: Not on file    Last Labs:  Admission on 02/11/2020, Discharged on 02/15/2020  Component Date Value Ref Range Status  . WBC 02/12/2020 3.8* 4.0 - 10.5 K/uL Final  . RBC 02/12/2020 5.02  3.87 - 5.11 MIL/uL Final  . Hemoglobin 02/12/2020 14.9  12.0 - 15.0 g/dL Final  . HCT 68/07/2120 46.6* 36.0 - 46.0 %  Final  . MCV 02/12/2020 92.8  80.0 - 100.0 fL Final  . MCH 02/12/2020 29.7  26.0 - 34.0 pg Final  . MCHC 02/12/2020 32.0  30.0 - 36.0 g/dL Final  . RDW 46/96/2952 12.5  11.5 - 15.5 % Final  . Platelets 02/12/2020 135* 150 - 400 K/uL Final  . nRBC 02/12/2020 0.0  0.0 - 0.2 % Final   Performed at [facility], [address] [place], [place] [address]  . Sodium 02/12/2020 139  135 - 145 mmol/L Final  . Potassium 02/12/2020 4.0  3.5 - 5.1 mmol/L Final  . Chloride 02/12/2020 105  98 - 111 mmol/L Final  . CO2 02/12/2020 21* 22 - 32 mmol/L Final  . Glucose, Bld 02/12/2020 66* 70 - 99 mg/dL Final   Glucose reference range applies only to samples taken after fasting for at least 8 hours.  . BUN 02/12/2020 17  6 - 20 mg/dL Final  . Creatinine, Ser 02/12/2020 0.92  0.44 - 1.00 mg/dL Final  . Calcium 44/05/270 9.6  8.9 - 10.3 mg/dL Final  . Total Protein 02/12/2020 7.8  6.5 - 8.1 g/dL Final  . Albumin 53/66/4403 4.6  3.5 - 5.0 g/dL Final  . AST 47/42/5956 19  15 - 41 U/L Final  . ALT 02/12/2020 16  0 - 44 U/L Final  . Alkaline Phosphatase 02/12/2020 62  38 - 126 U/L Final  . Total Bilirubin 02/12/2020 0.3  0.3 - 1.2 mg/dL Final  . GFR calc non Af Amer 02/12/2020 >60  >60 mL/min Final  . GFR calc Af Amer  02/12/2020 >60  >60 mL/min Final  . Anion gap 02/12/2020 13  5 - 15 Final   Performed at [facility], [address], [place], [place] [address]  . Hgb A1c MFr Bld 02/12/2020 5.8* 4.8 - 5.6 % Final   Comment: (NOTE) Pre diabetes:          5.7%-6.4%  Diabetes:              >6.4%  Glycemic control for   <7.0% adults with diabetes   . Mean Plasma Glucose 02/12/2020 119.76  mg/dL Final   Performed at [facility], [address], [place], [place] [address]  . Cholesterol 02/12/2020 158  0 - 200 mg/dL Final  . Triglycerides 02/12/2020 89  <150 mg/dL Final  . HDL 51/88/4166 58  >40 mg/dL Final  . Total CHOL/HDL Ratio 02/12/2020 2.7  RATIO Final  . VLDL 02/12/2020 18  0 - 40 mg/dL Final  . LDL Cholesterol 02/12/2020 82  0 - 99 mg/dL Final   Comment:        Total Cholesterol/HDL:CHD Risk Coronary Heart Disease Risk Table                     Men   Women  1/2 Average Risk   3.4   3.3  Average Risk       5.0   4.4  2 X Average Risk   9.6   7.1  3 X Average Risk  23.4   11.0        Use the calculated Patient Ratio above and the CHD Risk Table to determine the patient's CHD Risk.        ATP III CLASSIFICATION (LDL):  <100     mg/dL   Optimal  063-016  mg/dL   Near or Above  Optimal  130-159  mg/dL   Borderline  161-096  mg/dL   High  >045     mg/dL   Very High Performed at [facility], [address], [place], [place] [address]   . TSH 02/12/2020 2.579  0.350 - 4.500 uIU/mL Final   Comment: Performed by a 3rd Generation assay with a functional sensitivity of <=0.01 uIU/mL. Performed at [facility], [address] [place], [place] [address]   . Glucose-Capillary 02/13/2020 110* 70 - 99 mg/dL Final   Glucose reference range applies only to samples taken after fasting for at least 8 hours.  . Comment 1 02/13/2020 Notify RN   Final  . Comment 2 02/13/2020 Document in Chart   Final  Admission on  02/10/2020, Discharged on 02/11/2020  Component Date Value Ref Range Status  . SARS Coronavirus 2 Ag 02/10/2020 Negative  Negative Final  . POC Amphetamine UR 02/10/2020 None Detected  None Detected Final  . POC Secobarbital (BAR) 02/10/2020 None Detected  None Detected Final  . POC Buprenorphine (BUP) 02/10/2020 None Detected  None Detected Final  . POC Oxazepam (BZO) 02/10/2020 None Detected  None Detected Final  . POC Cocaine UR 02/10/2020 None Detected  None Detected Final  . POC Methamphetamine UR 02/10/2020 None Detected  None Detected Final  . POC Morphine 02/10/2020 None Detected  None Detected Final  . POC Oxycodone UR 02/10/2020 None Detected  None Detected Final  . POC Methadone UR 02/10/2020 None Detected  None Detected Final  . POC Marijuana UR 02/10/2020 None Detected  None Detected Final  . SARS Coronavirus 2 by RT PCR 02/10/2020 NEGATIVE  NEGATIVE Final   Comment: (NOTE) SARS-CoV-2 target nucleic acids are NOT DETECTED.  The SARS-CoV-2 RNA is generally detectable in upper respiratoy specimens during the acute phase of infection. The lowest concentration of SARS-CoV-2 viral copies this assay can detect is 131 copies/mL. A negative result does not preclude SARS-Cov-2 infection and should not be used as the sole basis for treatment or other patient management decisions. A negative result may occur with  improper specimen collection/handling, submission of specimen other than nasopharyngeal swab, presence of viral mutation(s) within the areas targeted by this assay, and inadequate number of viral copies (<131 copies/mL). A negative result must be combined with clinical observations, patient history, and epidemiological information. The expected result is Negative.  Fact Sheet for Patients:  https://www.moore.com/  Fact Sheet for Healthcare Providers:  https://www.young.biz/  This test is no                          t yet approved  or cleared by the [place] FDA and  has been authorized for detection and/or diagnosis of SARS-CoV-2 by FDA under an Emergency Use Authorization (EUA). This EUA will remain  in effect (meaning this test can be used) for the duration of the COVID-19 declaration under Section 564(b)(1) of the Act, 21 U.S.C. section 360bbb-3(b)(1), unless the authorization is terminated or revoked sooner.    . Influenza A by PCR 02/10/2020 NEGATIVE  NEGATIVE Final  . Influenza B by PCR 02/10/2020 NEGATIVE  NEGATIVE Final   Comment: (NOTE) The Xpert Xpress SARS-CoV-2/FLU/RSV assay is intended as an aid in  the diagnosis of influenza from Nasopharyngeal swab specimens and  should not be used as a sole basis for treatment. Nasal washings and  aspirates are unacceptable for Xpert Xpress SARS-CoV-2/FLU/RSV  testing.  Fact Sheet for Patients: https://www.moore.com/  Fact Sheet for Healthcare Providers: https://www.young.biz/https://www.fda.gov/media/142435/download  This test is not yet approved or cleared by the [place] FDA and  has been authorized for detection and/or diagnosis of SARS-CoV-2 by  FDA under an Emergency Use Authorization (EUA). This EUA will remain  in effect (meaning this test can be used) for the duration of the  Covid-19 declaration under Section 564(b)(1) of the Act, 21  U.S.C. section 360bbb-3(b)(1), unless the authorization is  terminated or revoked. Performed at [facility], [address] [place], [place] [address]   . Preg Test, Ur 02/10/2020 NEGATIVE  NEGATIVE Final   Comment:        THE SENSITIVITY OF THIS METHODOLOGY IS >24 mIU/mL     Allergies: Dopamine and Peanut-containing drug products  PTA Medications: (Not in a hospital admission)   Medical Decision Making  Patient reviewed with Dr. Jannifer FranklinAkintayo.  Discussed with patient restarting home medications with the addition of Vistaril 25 mg 3 times daily/anxiety.  Discussed side effects, patient verbalizes  agreement with treatment plan. Continued current home medications including: -Brexpiprazole 2 mg daily -Lamotrigine 150 mg daily -Risperidone 3 mg nightly  Start: -Hydroxyzine 25 mg 3 times daily as needed anxiety     Recommendations  Based on my evaluation the patient does not appear to have an emergency medical condition.  [facility] and [facility].  Patient will be reassessed by treatment team daily.  Patrcia Dollyina L Tate, FNP 07/13/20  1:45 PM

## 2020-07-14 DIAGNOSIS — F250 Schizoaffective disorder, bipolar type: Secondary | ICD-10-CM | POA: Diagnosis not present

## 2020-07-14 DIAGNOSIS — F603 Borderline personality disorder: Secondary | ICD-10-CM | POA: Diagnosis not present

## 2020-07-14 DIAGNOSIS — F259 Schizoaffective disorder, unspecified: Secondary | ICD-10-CM | POA: Diagnosis not present

## 2020-07-14 LAB — POCT PREGNANCY, URINE: Preg Test, Ur: NEGATIVE

## 2020-07-14 NOTE — ED Provider Notes (Signed)
FBC/OBS ASAP Discharge Summary  Date and Time: 07/14/2020 11:16 AM  Name: Alexis [name]  MRN:  [id number]   Discharge Diagnoses:  Final diagnoses:  Schizoaffective disorder, bipolar type (HCC)  Borderline personality disorder (HCC)    Subjective: Patient reports today that she feels fine.  She denies any suicidal or homicidal ideations and denies any hallucinations currently.  She reports that [facility] however they wanted her to come and be evaluated due to her reports of suicidal ideations to determine if she needed to be admitted for inpatient treatment.  Patient states that she is not suicidal and that she does have thoughts that come and go and she reports that [facility].  She provides me permission to contact her mother for collateral information and safety planning. Contacted patient's mother, Joslyn HySonji Mock, and she reports that she has no safety concerns with the patient coming home.  She states that she has been attending her therapy and taking her medications and [facility]  She states that she is concerned that the patient will not get in and is asking for additional resources.  They are provided with resources for [facility].  Stay Summary: Patient is a 25 year old female who presented to the [facility] voluntarily as a walk-in reporting worsening depression and suicidal ideations recently.  Patient reported 1 previous suicide attempt in September 2021 and also endorsed having self-harm behaviors.  Patient has a previous diagnosis of borderline personality disorder as well as bipolar disorder.  Patient is currently in DBT therapy treatment.  Due to patient's complaint she was admitted to the continuous observation unit for overnight assessment and restarted on her home medications.  Today the patient reports that she is fine and was  brought here to be evaluated to determine if she needed inpatient prior [facility].  Patient today denies any suicidal or homicidal ideations and denies any hallucinations.  Patient reports being compliant with her medications.  Patient reports that [facility] and feels that she is ready for discharge today.  Patient's mother was contacted for safety planning and collateral information.  There are no safety concerns with the patient discharging home today and the plan is for the patient to [facility].  They were also provided with [place] [facility] information in case something [facility].  Patient and patient's mother confirmed the patient had adequate medications at home.  Will be discharged home with her mother.  Total Time spent with patient: 20 minutes  Past Psychiatric History: Bipolar disorder, Borderline personality Past Medical History:  Past Medical History:  Diagnosis Date  . Allergy   . Auditory hallucination   . Bipolar disorder (HCC)   . Deliberate self-cutting   . GAD (generalized anxiety disorder)   . Heart disease   . 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 Migraines   . Right ovarian cyst 02/20/2019   3.7 cm right ovarian cyst.  . Schizoaffective disorder (HCC)   . Tetralogy of Fallot     Past Surgical History:  Procedure Laterality Date  . BIOPSY  03/08/2019   Procedure: BIOPSY;  Surgeon: Jeani HawkingHung, Patrick, MD;  Location: [facility];  Service: Endoscopy;;  . CARDIAC SURGERY    .  CARDIAC SURGERY     4 open heart surgeries  . ESOPHAGOGASTRODUODENOSCOPY (EGD) WITH PROPOFOL N/A 03/08/2019   Procedure: ESOPHAGOGASTRODUODENOSCOPY (EGD) WITH PROPOFOL;  Surgeon: Jeani Hawking, MD;  Location: [facility];  Service: Endoscopy;  Laterality: N/A;  . GASTROSTOMY W/ FEEDING TUBE     removed 1 year ago   . THORACIC DUCT LIGATION     Family History:  Family History   Problem Relation Age of Onset  . Hypertension Maternal Grandmother   . Diabetes Maternal Grandfather   . Heart disease Maternal Grandfather   . Stroke Maternal Grandfather   . Hypertension Mother   . Healthy Father    Family Psychiatric History: None reported Social History:  Social History   Substance and Sexual Activity  Alcohol Use Yes   Comment: occas     Social History   Substance and Sexual Activity  Drug Use Not Currently  . Types: Marijuana   Comment: Occas.  Hemp    Social History   Socioeconomic History  . Marital status: Single    Spouse name: Not on file  . Number of children: 0  . Years of education: College  . Highest education level: Not on file  Occupational History  . Occupation: Consulting civil engineer  Tobacco Use  . Smoking status: Never Smoker  . Smokeless tobacco: Never Used  Vaping Use  . Vaping Use: Never used  Substance and Sexual Activity  . Alcohol use: Yes    Comment: occas  . Drug use: Not Currently    Types: Marijuana    Comment: Occas.  Hemp  . Sexual activity: Not Currently    Partners: Male  Other Topics Concern  . Not on file  Social History Narrative   Lives at home with mother.   Right-handed.   No more than 2 cups caffeine per day.      Works at [employer]   Social Determinants of Health   Financial Resource Strain: Not on file  Food Insecurity: Not on file  Transportation Needs: Not on file  Physical Activity: Not on file  Stress: Not on file  Social Connections: Not on file   SDOH:  SDOH Screenings   Alcohol Screen: Low Risk   . Last Alcohol Screening Score (AUDIT): 3  Depression (PHQ2-9): Medium Risk  . PHQ-2 Score: 15  Financial Resource Strain: Not on file  Food Insecurity: Not on file  Housing: Not on file  Physical Activity: Not on file  Social Connections: Not on file  Stress: Not on file  Tobacco Use: Low Risk   . Smoking Tobacco Use: Never Smoker  . Smokeless Tobacco Use: Never Used   Transportation Needs: Not on file    Has this patient used any form of tobacco in the last 30 days? (Cigarettes, Smokeless Tobacco, Cigars, and/or Pipes) A prescription for an FDA-approved tobacco cessation medication was offered at discharge and the patient refused  Current Medications:  Current Facility-Administered Medications  Medication Dose Route Frequency Provider Last Rate Last Admin  . acetaminophen (TYLENOL) tablet 650 mg  650 mg Oral Q6H PRN Patrcia Dolly, FNP      . alum & mag hydroxide-simeth (MAALOX/MYLANTA) 200-200-20 MG/5ML suspension 30 mL  30 mL Oral Q4H PRN Patrcia Dolly, FNP      . brexpiprazole ([name]) tablet 2 mg  2 mg Oral Daily Patrcia Dolly, FNP   2 mg at 07/14/20 1001  . hydrOXYzine (ATARAX/VISTARIL) tablet 25 mg  25 mg Oral TID PRN Arlana Pouch,  Gwendolyn Fill, FNP   25 mg at 07/13/20 2113  . lamoTRIgine (LAMICTAL) tablet 150 mg  150 mg Oral Daily Patrcia Dolly, FNP   150 mg at 07/14/20 1001  . magnesium hydroxide (MILK OF MAGNESIA) suspension 30 mL  30 mL Oral Daily PRN Patrcia Dolly, FNP      . risperiDONE (RISPERDAL M-TABS) disintegrating tablet 3 mg  3 mg Oral QHS Patrcia Dolly, FNP   3 mg at 07/13/20 2113   Current Outpatient Medications  Medication Sig Dispense Refill  . EPINEPHrine 0.3 mg/0.3 mL IJ SOAJ injection Inject 0.3 mLs (0.3 mg total) into the muscle as needed for anaphylaxis. 1 each 1  . Erenumab-aooe (AIMOVIG) 70 MG/ML SOAJ Inject 70 mg into the skin every 30 (thirty) days. 1 mL 11  . lamoTRIgine (LAMICTAL) 150 MG tablet Take 150 mg by mouth daily.    . LO LOESTRIN FE 1 MG-10 MCG / 10 MCG tablet Take 1 tablet by mouth daily.    . ondansetron (ZOFRAN ODT) 4 MG disintegrating tablet Take 1 tablet (4 mg total) by mouth every 8 (eight) hours as needed. 20 tablet 6  . [name] 2 MG TABS tablet Take 2 mg by mouth daily.    . risperiDONE (RISPERDAL) 3 MG tablet Take 1.5 tablets (4.5 mg total) by mouth at bedtime. 60 tablet 1  . rizatriptan (MAXALT-MLT) 5 MG disintegrating  tablet Take 1 tablet (5 mg total) by mouth as needed. May repeat in 2 hours if needed (Patient not taking: No sig reported) 15 tablet 12    PTA Medications: (Not in a hospital admission)   Musculoskeletal  Strength & Muscle Tone: within normal limits Gait & Station: normal Patient leans: N/A  Psychiatric Specialty Exam  Presentation  General Appearance: Appropriate for Environment; Casual; Well Groomed  Eye Contact:Good  Speech:Clear and Coherent; Normal Rate  Speech Volume:Normal  Handedness:Right   Mood and Affect  Mood:Depressed  Affect:Appropriate; Congruent; Depressed   Thought Process  Thought Processes:Coherent  Descriptions of Associations:Intact  Orientation:Full (Time, Place and Person)  Thought Content:WDL  Hallucinations:Hallucinations: None Description of Command Hallucinations: Patient reports auditory hallucinations, command in nature.  "I hear voices that I am worthless or into my life or to cut." Description of Auditory Hallucinations: Command hallucinations  Ideas of Reference:None  Suicidal Thoughts:Suicidal Thoughts: No SI Passive Intent and/or Plan: Without Intent; Without Plan  Homicidal Thoughts:Homicidal Thoughts: No   Sensorium  Memory:Immediate Good; Recent Good; Remote Good  Judgment:Good  Insight:Good   Executive Functions  Concentration:Good  Attention Span:Good  Recall:Good  Fund of Knowledge:Good  Language:Good   Psychomotor Activity  Psychomotor Activity:Psychomotor Activity: Normal   Assets  Assets:Communication Skills; Desire for Improvement; Financial Resources/Insurance; Housing; Physical Health; Social Support; Transportation   Sleep  Sleep:Sleep: Good   Nutritional Assessment (For OBS and FBC admissions only) Has the patient had a weight loss or gain of 10 pounds or more in the last 3 months?: Yes Has the patient had a decrease in food intake/or appetite?: Yes Does the patient have dental  problems?: No Does the patient have eating habits or behaviors that may be indicators of an eating disorder including binging or inducing vomiting?: Yes Has the patient recently lost weight without trying?: Yes, 2-13 lbs. Has the patient been eating poorly because of a decreased appetite?: Yes Malnutrition Screening Tool Score: 2    Physical Exam  Physical Exam Vitals and nursing note reviewed.  Constitutional:      Appearance: She is well-developed.  HENT:     Head: Normocephalic.  Eyes:     Pupils: Pupils are equal, round, and reactive to light.  Cardiovascular:     Rate and Rhythm: Normal rate.  Pulmonary:     Effort: Pulmonary effort is normal.  Musculoskeletal:        General: Normal range of motion.  Neurological:     Mental Status: She is alert and oriented to person, place, and time.    Review of Systems  Constitutional: Negative.   HENT: Negative.   Eyes: Negative.   Respiratory: Negative.   Cardiovascular: Negative.   Gastrointestinal: Negative.   Genitourinary: Negative.   Musculoskeletal: Negative.   Skin: Negative.   Neurological: Negative.   Endo/Heme/Allergies: Negative.   Psychiatric/Behavioral: Positive for depression. Negative for suicidal ideas.   Blood pressure 116/70, pulse (!) 107, temperature 97.6 F (36.4 C), temperature source Oral, resp. rate 16, SpO2 100 %. There is no height or weight on file to calculate BMI.  Demographic Factors:  Adolescent or young adult  Loss Factors: NA  Historical Factors: Prior suicide attempts and Impulsivity  Risk Reduction Factors:   Sense of responsibility to family, Living with another person, especially a relative, Positive social support, Positive therapeutic relationship and Positive coping skills or problem solving skills  Continued Clinical Symptoms:  Previous Psychiatric Diagnoses and Treatments  Cognitive Features That Contribute To Risk:  None    Suicide Risk:  Mild:  Suicidal ideation of  limited frequency, intensity, duration, and specificity.  There are no identifiable plans, no associated intent, mild dysphoria and related symptoms, good self-control (both objective and subjective assessment), few other risk factors, and identifiable protective factors, including available and accessible social support.  Plan Of Care/Follow-up recommendations:  Continue activity as tolerated. Continue diet as recommended by your PCP. Ensure to keep all appointments with outpatient providers.  Disposition: Discharge home with mother  Maryfrances Bunnell, FNP 07/14/2020, 11:16 AM

## 2020-07-14 NOTE — Discharge Summary (Signed)
Alexis [name] to be D/C'd home per NP order. Discussed with the patient and all questions fully answered. An After Visit Summary was printed and given to the patient.  Patient escorted out and D/C home via private auto.  Dickie La  07/14/2020 11:55 AM

## 2020-07-14 NOTE — Discharge Instructions (Signed)

## 2020-07-14 NOTE — ED Notes (Signed)
Pt asleep in bed. Respirations even and unlabored. Will continue to monitor for safety. ?

## 2020-07-14 NOTE — Progress Notes (Signed)
Pt is sleeping. Resps are even and unlabored. No signs of acute distress noted. Pt received meds with no incident. Pt denies SI, HI and AVH at this time. Staff will monitor for pt's safety.

## 2020-07-14 NOTE — ED Notes (Signed)
Pt remains asleep in bed. Respirations even and unlabored. Will continue to monitor for safety.  

## 2020-07-15 DIAGNOSIS — F250 Schizoaffective disorder, bipolar type: Secondary | ICD-10-CM | POA: Diagnosis not present

## 2020-07-15 DIAGNOSIS — F4310 Post-traumatic stress disorder, unspecified: Secondary | ICD-10-CM | POA: Diagnosis not present

## 2020-07-19 DIAGNOSIS — F4310 Post-traumatic stress disorder, unspecified: Secondary | ICD-10-CM | POA: Diagnosis not present

## 2020-07-19 DIAGNOSIS — F250 Schizoaffective disorder, bipolar type: Secondary | ICD-10-CM | POA: Diagnosis not present

## 2020-07-22 DIAGNOSIS — F333 Major depressive disorder, recurrent, severe with psychotic symptoms: Secondary | ICD-10-CM | POA: Diagnosis not present

## 2020-07-23 DIAGNOSIS — F333 Major depressive disorder, recurrent, severe with psychotic symptoms: Secondary | ICD-10-CM | POA: Diagnosis not present

## 2020-07-24 DIAGNOSIS — F333 Major depressive disorder, recurrent, severe with psychotic symptoms: Secondary | ICD-10-CM | POA: Diagnosis not present

## 2020-07-25 DIAGNOSIS — F333 Major depressive disorder, recurrent, severe with psychotic symptoms: Secondary | ICD-10-CM | POA: Diagnosis not present

## 2020-07-28 ENCOUNTER — Other Ambulatory Visit (HOSPITAL_COMMUNITY): Payer: Federal, State, Local not specified - PPO | Attending: Psychiatry

## 2020-07-28 ENCOUNTER — Other Ambulatory Visit: Payer: Self-pay

## 2020-07-28 ENCOUNTER — Telehealth (HOSPITAL_COMMUNITY): Payer: Self-pay | Admitting: Licensed Clinical Social Worker

## 2020-07-28 DIAGNOSIS — F333 Major depressive disorder, recurrent, severe with psychotic symptoms: Secondary | ICD-10-CM | POA: Diagnosis not present

## 2020-07-29 DIAGNOSIS — F333 Major depressive disorder, recurrent, severe with psychotic symptoms: Secondary | ICD-10-CM | POA: Diagnosis not present

## 2020-07-30 DIAGNOSIS — F333 Major depressive disorder, recurrent, severe with psychotic symptoms: Secondary | ICD-10-CM | POA: Diagnosis not present

## 2020-07-31 DIAGNOSIS — F333 Major depressive disorder, recurrent, severe with psychotic symptoms: Secondary | ICD-10-CM | POA: Diagnosis not present

## 2020-08-01 DIAGNOSIS — F333 Major depressive disorder, recurrent, severe with psychotic symptoms: Secondary | ICD-10-CM | POA: Diagnosis not present

## 2020-08-04 DIAGNOSIS — F333 Major depressive disorder, recurrent, severe with psychotic symptoms: Secondary | ICD-10-CM | POA: Diagnosis not present

## 2020-08-05 DIAGNOSIS — F333 Major depressive disorder, recurrent, severe with psychotic symptoms: Secondary | ICD-10-CM | POA: Diagnosis not present

## 2020-08-06 DIAGNOSIS — F333 Major depressive disorder, recurrent, severe with psychotic symptoms: Secondary | ICD-10-CM | POA: Diagnosis not present

## 2020-08-07 DIAGNOSIS — F333 Major depressive disorder, recurrent, severe with psychotic symptoms: Secondary | ICD-10-CM | POA: Diagnosis not present

## 2020-08-08 DIAGNOSIS — F333 Major depressive disorder, recurrent, severe with psychotic symptoms: Secondary | ICD-10-CM | POA: Diagnosis not present

## 2020-08-11 DIAGNOSIS — F333 Major depressive disorder, recurrent, severe with psychotic symptoms: Secondary | ICD-10-CM | POA: Diagnosis not present

## 2020-08-12 DIAGNOSIS — F333 Major depressive disorder, recurrent, severe with psychotic symptoms: Secondary | ICD-10-CM | POA: Diagnosis not present

## 2020-08-13 DIAGNOSIS — F333 Major depressive disorder, recurrent, severe with psychotic symptoms: Secondary | ICD-10-CM | POA: Diagnosis not present

## 2020-08-14 DIAGNOSIS — F333 Major depressive disorder, recurrent, severe with psychotic symptoms: Secondary | ICD-10-CM | POA: Diagnosis not present

## 2020-08-15 DIAGNOSIS — F333 Major depressive disorder, recurrent, severe with psychotic symptoms: Secondary | ICD-10-CM | POA: Diagnosis not present

## 2020-08-18 DIAGNOSIS — F333 Major depressive disorder, recurrent, severe with psychotic symptoms: Secondary | ICD-10-CM | POA: Diagnosis not present

## 2020-08-19 DIAGNOSIS — F333 Major depressive disorder, recurrent, severe with psychotic symptoms: Secondary | ICD-10-CM | POA: Diagnosis not present

## 2020-08-20 DIAGNOSIS — F333 Major depressive disorder, recurrent, severe with psychotic symptoms: Secondary | ICD-10-CM | POA: Diagnosis not present

## 2020-08-21 DIAGNOSIS — F333 Major depressive disorder, recurrent, severe with psychotic symptoms: Secondary | ICD-10-CM | POA: Diagnosis not present

## 2020-08-25 DIAGNOSIS — F333 Major depressive disorder, recurrent, severe with psychotic symptoms: Secondary | ICD-10-CM | POA: Diagnosis not present

## 2020-08-26 DIAGNOSIS — F333 Major depressive disorder, recurrent, severe with psychotic symptoms: Secondary | ICD-10-CM | POA: Diagnosis not present

## 2020-08-27 DIAGNOSIS — F333 Major depressive disorder, recurrent, severe with psychotic symptoms: Secondary | ICD-10-CM | POA: Diagnosis not present

## 2020-08-28 DIAGNOSIS — F333 Major depressive disorder, recurrent, severe with psychotic symptoms: Secondary | ICD-10-CM | POA: Diagnosis not present

## 2020-09-01 DIAGNOSIS — F333 Major depressive disorder, recurrent, severe with psychotic symptoms: Secondary | ICD-10-CM | POA: Diagnosis not present

## 2020-09-02 DIAGNOSIS — F250 Schizoaffective disorder, bipolar type: Secondary | ICD-10-CM | POA: Diagnosis not present

## 2020-09-02 DIAGNOSIS — F4310 Post-traumatic stress disorder, unspecified: Secondary | ICD-10-CM | POA: Diagnosis not present

## 2020-09-02 DIAGNOSIS — F333 Major depressive disorder, recurrent, severe with psychotic symptoms: Secondary | ICD-10-CM | POA: Diagnosis not present

## 2020-09-03 DIAGNOSIS — F259 Schizoaffective disorder, unspecified: Secondary | ICD-10-CM | POA: Diagnosis not present

## 2020-09-03 DIAGNOSIS — F333 Major depressive disorder, recurrent, severe with psychotic symptoms: Secondary | ICD-10-CM | POA: Diagnosis not present

## 2020-09-09 DIAGNOSIS — F4310 Post-traumatic stress disorder, unspecified: Secondary | ICD-10-CM | POA: Diagnosis not present

## 2020-09-09 DIAGNOSIS — F250 Schizoaffective disorder, bipolar type: Secondary | ICD-10-CM | POA: Diagnosis not present

## 2020-09-16 DIAGNOSIS — F250 Schizoaffective disorder, bipolar type: Secondary | ICD-10-CM | POA: Diagnosis not present

## 2020-09-16 DIAGNOSIS — F4310 Post-traumatic stress disorder, unspecified: Secondary | ICD-10-CM | POA: Diagnosis not present

## 2020-09-18 DIAGNOSIS — F259 Schizoaffective disorder, unspecified: Secondary | ICD-10-CM | POA: Diagnosis not present

## 2020-09-23 DIAGNOSIS — F250 Schizoaffective disorder, bipolar type: Secondary | ICD-10-CM | POA: Diagnosis not present

## 2020-09-23 DIAGNOSIS — F4310 Post-traumatic stress disorder, unspecified: Secondary | ICD-10-CM | POA: Diagnosis not present

## 2020-09-24 ENCOUNTER — Encounter: Payer: Self-pay | Admitting: Neurology

## 2020-09-24 ENCOUNTER — Ambulatory Visit: Payer: Federal, State, Local not specified - PPO | Admitting: Neurology

## 2020-09-24 ENCOUNTER — Other Ambulatory Visit: Payer: Self-pay

## 2020-09-24 VITALS — BP 113/70 | HR 71 | Ht 65.0 in | Wt 128.0 lb

## 2020-09-24 DIAGNOSIS — G43709 Chronic migraine without aura, not intractable, without status migrainosus: Secondary | ICD-10-CM

## 2020-09-24 DIAGNOSIS — Z79899 Other long term (current) drug therapy: Secondary | ICD-10-CM

## 2020-09-24 DIAGNOSIS — F32A Depression, unspecified: Secondary | ICD-10-CM | POA: Insufficient documentation

## 2020-09-24 DIAGNOSIS — G43001 Migraine without aura, not intractable, with status migrainosus: Secondary | ICD-10-CM | POA: Diagnosis not present

## 2020-09-24 MED ORDER — SUMATRIPTAN SUCCINATE 100 MG PO TABS: 100.0000 mg | ORAL_TABLET | Freq: Once | ORAL | 6 refills | Status: DC | PRN

## 2020-09-24 NOTE — Progress Notes (Signed)
Chief Complaint  Patient presents with  . Follow-up    Early follow up for a persistent migraine for the last six weeks. She never started the Aimovig 70mg  due to cost. She did not realize the PA approval would make her co-pay lower. She is willing to start it now up. Rizatriptan was not helpful at all. She has never tried any other rescue medications. Ibuprofen does not work.   ASSESSMENT AND PLAN  Alexis Alexis [name] is a 25 y.o. female   Chronic migraine headache Schizoaffective disorder Polypharmacy treatment History of tetralogy of Fallot, multiple open heart surgery in the past  Now on lamotrigine 200 mg for her mood disorder,  Previously tried Inderal beta-blocker as preventive medication without helping her headache,  Also tried and failed multiple antidepression in the past, including Vraylar, Seroquel, Risperdal, Geodon, Latuda, lithium, Zyprexa, Saphris.  Now on Seroquel 75 mg daily  Provided aimovig 70 mg every month as preventive medication, including coupon  Maxalt 5 mg dissolvable was not helpful, will try Imitrex 100 mg as needed, may combine with Zofran as needed,Aleve for moderate to severe headaches  Return to clinic with nurse practitioner Sarah depression 3 months    HISTORICAL  30 Alexis [name] is a 25 year old female, seen in request by her primary care nurse practitioner 30 for evaluation of chronic migraine headache.  I reviewed and summarized the referring note.  Past medical history Tetralogy of Fallot, status post multiple heart surgery in the past Schizoaffective disorder, admission in September 2021 for worsening depression,  I saw her previously in 2017 for chronic migraine headache, she reported a history of migraine headaches since middle school, her typical migraine a lateralized severe pounding headache with associated light noise sensitivity, lasting for hours to days, she could not function during migraine, she was put on Depakote as  preventive medication, reported significant improvement of her headache,  She currently works as a 2018, reported increased migraine headaches since 2021, at least once a week, severe migraine headache, with nausea, lasting for 1 day, sometimes relieved by over-the-counter Advil, but oftentimes is not as effective,  I personally reviewed MRI of the brain in 2017, there was no significant abnormality  Laboratory evaluations in September 2021: Normal TSH, lipid panel, A1c 5.8, CMP, CBC hemoglobin 14.9  Hospital admission for worsening schizoaffective versus bipolar and posttraumatic stress disorder, currently she is treated with lamotrigine 125 mg daily, she reported suboptimal control of her mood disorder, still feel depressed  She denied lateralized motor or sensory deficit,  UPDATE Sep 24 2020: She did not get her aimovig as ordered, tried Maxalt few times, did not have significant side effect, but did not help her headache, she continues to have frequent headaches, she had a headache for 1 week, taking ibuprofen 1000 mg daily, total of Maxalt 3 times without helping her headache, pain in complaints retro-orbital area, headache with light noise sensitivity, 9/10, she received IV Depacon, Toradol 30 mg, reported trending down of her headache, tolerating it well  She also complains of worsening depression, higher dose of lamotrigine 200 mg daily, stop Risperdal because of frequent eye blinking concerning tardive dyskinesia, now on Seroquel 75 mg every night,   REVIEW OF SYSTEMS: Full 14 system review of systems performed and notable only for as above All other review of systems were negative.  ALLERGIES: Allergies  Allergen Reactions  . Dopamine     Makes WBC rise  . Peanut-Containing Drug Products     04-12-1979  nuts [place] nuts    HOME MEDICATIONS: Current Outpatient Medications  Medication Sig Dispense Refill  . EPINEPHrine 0.3 mg/0.3 mL IJ SOAJ injection Inject 0.3  mLs (0.3 mg total) into the muscle as needed for anaphylaxis. 1 each 1  . Erenumab-aooe (AIMOVIG) 70 MG/ML SOAJ Inject 70 mg into the skin every 30 (thirty) days. 1 mL 11  . lamoTRIgine (LAMICTAL) 150 MG tablet Take 150 mg by mouth daily.    . LO LOESTRIN FE 1 MG-10 MCG / 10 MCG tablet Take 1 tablet by mouth daily.    . ondansetron (ZOFRAN ODT) 4 MG disintegrating tablet Take 1 tablet (4 mg total) by mouth every 8 (eight) hours as needed. 20 tablet 6  . [name] 2 MG TABS tablet Take 2 mg by mouth daily.    . risperiDONE (RISPERDAL) 3 MG tablet Take 1.5 tablets (4.5 mg total) by mouth at bedtime. 60 tablet 1   No current facility-administered medications for this visit.    PAST MEDICAL HISTORY: Past Medical History:  Diagnosis Date  . Allergy   . Auditory hallucination   . Bipolar disorder (HCC)   . Deliberate self-cutting   . GAD (generalized anxiety disorder)   . Heart disease   . 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 Migraines   . Right ovarian cyst 02/20/2019   3.7 cm right ovarian cyst.  . Schizoaffective disorder (HCC)   . Tetralogy of Fallot     PAST SURGICAL HISTORY: Past Surgical History:  Procedure Laterality Date  . BIOPSY  03/08/2019   Procedure: BIOPSY;  Surgeon: Jeani Hawking, MD;  [facility];  Service: Endoscopy;;  . CARDIAC SURGERY    . CARDIAC SURGERY     4 open heart surgeries  . ESOPHAGOGASTRODUODENOSCOPY (EGD) WITH PROPOFOL Alexis/A 03/08/2019   Procedure: ESOPHAGOGASTRODUODENOSCOPY (EGD) WITH PROPOFOL;  Surgeon: Jeani Hawking, MD;  [facility];  Service: Endoscopy;  Laterality: Alexis/A;  . GASTROSTOMY W/ FEEDING TUBE     removed 1 year ago   . THORACIC DUCT LIGATION      FAMILY HISTORY: Family History  Problem Relation Age of Onset  . Hypertension Maternal Grandmother   . Diabetes Maternal Grandfather   . Heart disease Maternal Grandfather   . Stroke Maternal Grandfather   . Hypertension Mother   . Healthy Father      SOCIAL HISTORY: Social History   Socioeconomic History  . Marital status: Single    Spouse name: Not on file  . Number of children: 0  . Years of education: College  . Highest education level: Not on file  Occupational History  . Occupation: Consulting civil engineer  Tobacco Use  . Smoking status: Never Smoker  . Smokeless tobacco: Never Used  Vaping Use  . Vaping Use: Never used  Substance and Sexual Activity  . Alcohol use: Yes    Comment: occas  . Drug use: Not Currently    Types: Marijuana    Comment: Occas.  Hemp  . Sexual activity: Not Currently    Partners: Male  Other Topics Concern  . Not on file  Social History Narrative   Lives at home with mother.   Right-handed.   No more than 2 cups caffeine per day.      Works at [employer]   Social Determinants of Health   Financial Resource Strain: Not on file  Food Insecurity: Not on file  Transportation Needs: Not on file  Physical Activity: Not on file  Stress: Not on file  Social Connections: Not on file  Intimate Partner Violence: Not on file     PHYSICAL EXAM   Vitals:   09/24/20 1338  BP: 113/70  Pulse: 71  Weight: 128 lb (58.1 kg)  Height: 5\' 5"  (1.651 m)   Not recorded     Body mass index is 21.3 kg/m.  PHYSICAL EXAMNIATION:  Gen: NAD, conversant, well nourised, well groomed        NEUROLOGICAL EXAM:  MENTAL STATUS: Speech/cognition: Awake, alert, oriented to history taking and casual conversation   CRANIAL NERVES: CN II: Visual fields are full to confrontation. Pupils are round equal and briskly reactive to light. CN III, IV, VI: extraocular movement are normal. No ptosis. CN V: Facial sensation is intact to light touch CN VII: Face is symmetric with normal eye closure  CN VIII: Hearing is normal to causal conversation. CN IX, X: Phonation is normal. CN XI: Head turning and shoulder shrug are intact  MOTOR: Moving 4 extremities without difficulty  COORDINATION: There is  no trunk or limb dysmetria noted.  GAIT/STANCE: Posture is normal.    DIAGNOSTIC DATA (LABS, IMAGING, TESTING) - I reviewed patient records, labs, notes, testing and imaging myself where available.   , M.D. Ph.D.  [facility] [address], [address] [place], [place] Ph: [phone] Fax: [phone]  CC:  [phone], NP-C [address],  [place]

## 2020-09-30 DIAGNOSIS — F4310 Post-traumatic stress disorder, unspecified: Secondary | ICD-10-CM | POA: Diagnosis not present

## 2020-09-30 DIAGNOSIS — F250 Schizoaffective disorder, bipolar type: Secondary | ICD-10-CM | POA: Diagnosis not present

## 2020-10-02 ENCOUNTER — Ambulatory Visit: Payer: Federal, State, Local not specified - PPO | Admitting: Neurology

## 2020-10-08 ENCOUNTER — Ambulatory Visit (HOSPITAL_COMMUNITY)
Admission: EM | Admit: 2020-10-08 | Discharge: 2020-10-08 | Disposition: A | Discharge status: Home / Self Care | Payer: Worker's Compensation | Attending: Family Medicine | Admitting: Family Medicine

## 2020-10-08 ENCOUNTER — Other Ambulatory Visit: Payer: Self-pay

## 2020-10-08 DIAGNOSIS — S66911A Strain of unspecified muscle, fascia and tendon at wrist and hand level, right hand, initial encounter: Secondary | ICD-10-CM | POA: Diagnosis not present

## 2020-10-08 MED ORDER — CYCLOBENZAPRINE HCL 5 MG PO TABS: 5.0000 mg | ORAL_TABLET | Freq: Two times a day (BID) | ORAL | 0 refills | Status: DC | PRN

## 2020-10-08 MED ORDER — PREDNISONE 20 MG PO TABS: 40.0000 mg | ORAL_TABLET | Freq: Every day | ORAL | 0 refills | Status: DC

## 2020-10-08 NOTE — ED Provider Notes (Signed)
[facility]    CSN: [id number] Arrival date & time: 10/08/20  1829      History   Chief Complaint Chief Complaint  Patient presents with  . Wrist Pain    HPI Alexis [name] is a 25 y.o. female.   Patient presenting today with right wrist pain radiating up into forearm for about a week now.  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  She denies any swelling, discoloration, weakness but does have some numbness and tingling in the direction of radiation of pain upward into the forearm.  Trying ibuprofen here and there with mild temporary relief.  Also recently just got a wrist brace which she has been wearing off and on.  Denies past history of wrist injuries on this side.    Past Medical History:  Diagnosis Date  . Allergy   . Auditory hallucination   . Bipolar disorder (HCC)   . Deliberate self-cutting   . GAD (generalized anxiety disorder)   . Heart disease   . 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 Migraines   . Right ovarian cyst 02/20/2019   3.7 cm right ovarian cyst.  . Schizoaffective disorder (HCC)   . 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 Depression 09/24/2020  . Polypharmacy 06/30/2020  . Chronic migraine without aura without status migrainosus, not intractable 06/30/2020  . History of open heart surgery 06/30/2020  . Suicidal ideation 02/12/2020  . Bipolar I disorder (HCC) 02/12/2020  . Schizoaffective disorder (HCC) 02/11/2020  . Vitamin D deficiency 11/02/2019  . GAD (generalized anxiety disorder) 03/13/2018  . MDD (major depressive disorder) 03/13/2018  . Chronic migraine without aura 08/31/2012  . Insomnia, unspecified 08/31/2012  . 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 Adjustment disorder 07/26/2011  . Abdominal pain 07/26/2011    Past Surgical History:   Procedure Laterality Date  . BIOPSY  03/08/2019   Procedure: BIOPSY;  Surgeon: Jeani Hawking, MD;  Location: [facility];  Service: Endoscopy;;  . CARDIAC SURGERY    . CARDIAC SURGERY     4 open heart surgeries  . ESOPHAGOGASTRODUODENOSCOPY (EGD) WITH PROPOFOL N/A 03/08/2019   Procedure: ESOPHAGOGASTRODUODENOSCOPY (EGD) WITH PROPOFOL;  Surgeon: Jeani Hawking, MD;  Location: [facility];  Service: Endoscopy;  Laterality: N/A;  . GASTROSTOMY W/ FEEDING TUBE     removed 1 year ago   . THORACIC DUCT LIGATION      OB History   No obstetric history on file.      Home Medications    Prior to Admission medications   Medication Sig Start Date End Date Taking? Authorizing Provider  cyclobenzaprine (FLEXERIL) 5 MG tablet Take 1 tablet (5 mg total) by mouth 2 (two) times daily as needed for muscle spasms. Do not drink alcohol or drive while taking this medication.  May cause drowsiness. 10/08/20  Yes Particia Nearing, PA-C  predniSONE (DELTASONE) 20 MG tablet Take 2 tablets (40 mg total) by mouth daily with breakfast. 10/08/20  Yes Particia Nearing, PA-C  EPINEPHrine 0.3 mg/0.3 mL IJ SOAJ injection Inject 0.3 mLs (0.3 mg total) into the muscle as needed for anaphylaxis. 07/26/19   Moshe Cipro, NP  Erenumab-aooe (AIMOVIG) 70 MG/ML SOAJ Inject 70 mg into the skin every 30 (thirty) days. 07/01/20  Levert Feinstein, MD  lamoTRIgine (LAMICTAL) 200 MG tablet Take 200 mg by mouth daily. 07/11/20   [provider]  LO LOESTRIN FE 1 MG-10 MCG / 10 MCG tablet Take 1 tablet by mouth daily. 07/11/20   [provider]  ondansetron (ZOFRAN ODT) 4 MG disintegrating tablet Take 1 tablet (4 mg total) by mouth every 8 (eight) hours as needed. 06/30/20   Levert Feinstein, MD  QUEtiapine (SEROQUEL) 25 MG tablet Take 75 mg by mouth at bedtime.    [provider]  [name] 2 MG TABS tablet Take 2 mg by mouth daily. 06/23/20   [provider]  SUMAtriptan (IMITREX) 100 MG tablet  Take 1 tablet (100 mg total) by mouth once as needed for up to 1 dose for migraine. May repeat in 2 hours if headache persists or recurs. 09/24/20   Levert Feinstein, MD    Family History Family History  Problem Relation Age of Onset  . Hypertension Maternal Grandmother   . Diabetes Maternal Grandfather   . Heart disease Maternal Grandfather   . Stroke Maternal Grandfather   . Hypertension Mother   . Healthy Father     Social History Social History   Tobacco Use  . Smoking status: Never Smoker  . Smokeless tobacco: Never Used  Vaping Use  . Vaping Use: Never used  Substance Use Topics  . Alcohol use: Yes    Comment: occas  . Drug use: Not Currently    Types: Marijuana    Comment: Occas.  Hemp     Allergies   Dopamine and Peanut-containing drug products   Review of Systems Review of Systems Per HPI  Physical Exam Triage Vital Signs ED Triage Vitals  Enc Vitals Group     BP 10/08/20 1850 120/76     Pulse Rate 10/08/20 1850 76     Resp 10/08/20 1850 18     Temp 10/08/20 1850 98.4 F (36.9 C)     Temp Source 10/08/20 1850 Oral     SpO2 10/08/20 1850 98 %     Weight --      Height --      Head Circumference --      Peak Flow --      Pain Score 10/08/20 1849 6     Pain Loc --      Pain Edu? --      Excl. in GC? --    No data found.  Updated Vital Signs BP 120/76 (BP Location: Right Arm)   Pulse 76   Temp 98.4 F (36.9 C) (Oral)   Resp 18   SpO2 98%   Visual Acuity Right Eye Distance:   Left Eye Distance:   Bilateral Distance:    Right Eye Near:   Left Eye Near:    Bilateral Near:     Physical Exam Vitals and nursing note reviewed.  Constitutional:      Appearance: Normal appearance. She is not ill-appearing.  HENT:     Head: Atraumatic.  Eyes:     Extraocular Movements: Extraocular movements intact.     Conjunctiva/sclera: Conjunctivae normal.  Cardiovascular:     Rate and Rhythm: Normal rate and regular rhythm.     Heart sounds: Normal  heart sounds.  Pulmonary:     Effort: Pulmonary effort is normal.     Breath sounds: Normal breath sounds.  Musculoskeletal:        General: Tenderness present. No swelling or deformity. Normal range of motion.  Cervical back: Normal range of motion and neck supple.     Comments: Minimal tenderness to palpation right flexor surface of wrist into forearm, worse with rotation of the hand.  Grip strength full and equal bilateral hands  Skin:    General: Skin is warm and dry.     Findings: No bruising or erythema.  Neurological:     Mental Status: She is alert and oriented to person, place, and time.     Comments: Right upper extremity neurovascularly intact  Psychiatric:        Mood and Affect: Mood normal.        Thought Content: Thought content normal.        Judgment: Judgment normal.      UC Treatments / Results  Labs (all labs ordered are listed, but only abnormal results are displayed) Labs Reviewed - No data to display  EKG   Radiology No results found.  Procedures Procedures (including critical care time)  Medications Ordered in UC Medications - No data to display  Initial Impression / Assessment and Plan / UC Course  I have reviewed the triage vital signs and the nursing notes.  Pertinent labs & imaging results that were available during my care of the patient were reviewed by me and considered in my medical decision making (see chart for details).     No palpable bony deformity and rest and good range of motion in all joints of the right upper extremity.  Very low suspicion for bony injury, x-ray deferred with shared decision making today.  Suspect muscle strain causing symptoms, will treat with short prednisone burst, Flexeril as needed, stretches, heat, brace as needed.  Declines work note.  Follow-up with sports medicine if not resolving.  Final Clinical Impressions(s) / UC Diagnoses   Final diagnoses:  Wrist strain, right, initial encounter    Discharge Instructions   None    ED Prescriptions    Medication Sig Dispense Auth. Provider   predniSONE (DELTASONE) 20 MG tablet Take 2 tablets (40 mg total) by mouth daily with breakfast. 6 tablet Particia Nearing, PA-C   cyclobenzaprine (FLEXERIL) 5 MG tablet Take 1 tablet (5 mg total) by mouth 2 (two) times daily as needed for muscle spasms. Do not drink alcohol or drive while taking this medication.  May cause drowsiness. 10 tablet Particia Nearing, [place]     PDMP not reviewed this encounter.   Particia Nearing, [place] 10/08/20 1939

## 2020-10-08 NOTE — ED Triage Notes (Signed)
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

## 2020-10-14 DIAGNOSIS — F250 Schizoaffective disorder, bipolar type: Secondary | ICD-10-CM | POA: Diagnosis not present

## 2020-10-14 DIAGNOSIS — F4310 Post-traumatic stress disorder, unspecified: Secondary | ICD-10-CM | POA: Diagnosis not present

## 2020-10-21 DIAGNOSIS — F4310 Post-traumatic stress disorder, unspecified: Secondary | ICD-10-CM | POA: Diagnosis not present

## 2020-10-21 DIAGNOSIS — F250 Schizoaffective disorder, bipolar type: Secondary | ICD-10-CM | POA: Diagnosis not present

## 2020-10-28 DIAGNOSIS — F250 Schizoaffective disorder, bipolar type: Secondary | ICD-10-CM | POA: Diagnosis not present

## 2020-10-28 DIAGNOSIS — F4310 Post-traumatic stress disorder, unspecified: Secondary | ICD-10-CM | POA: Diagnosis not present

## 2020-10-30 DIAGNOSIS — F259 Schizoaffective disorder, unspecified: Secondary | ICD-10-CM | POA: Diagnosis not present

## 2020-11-04 DIAGNOSIS — F4310 Post-traumatic stress disorder, unspecified: Secondary | ICD-10-CM | POA: Diagnosis not present

## 2020-11-04 DIAGNOSIS — F250 Schizoaffective disorder, bipolar type: Secondary | ICD-10-CM | POA: Diagnosis not present

## 2020-11-11 DIAGNOSIS — F4310 Post-traumatic stress disorder, unspecified: Secondary | ICD-10-CM | POA: Diagnosis not present

## 2020-11-11 DIAGNOSIS — F250 Schizoaffective disorder, bipolar type: Secondary | ICD-10-CM | POA: Diagnosis not present

## 2020-11-12 ENCOUNTER — Encounter: Payer: Self-pay | Admitting: Internal Medicine

## 2020-11-13 ENCOUNTER — Ambulatory Visit (HOSPITAL_COMMUNITY)
Admission: EM | Admit: 2020-11-13 | Discharge: 2020-11-13 | Disposition: A | Discharge status: Home / Self Care | Payer: Federal, State, Local not specified - PPO | Attending: Emergency Medicine | Admitting: Emergency Medicine

## 2020-11-13 ENCOUNTER — Other Ambulatory Visit: Payer: Self-pay

## 2020-11-13 ENCOUNTER — Encounter (HOSPITAL_COMMUNITY): Payer: Self-pay

## 2020-11-13 DIAGNOSIS — E860 Dehydration: Secondary | ICD-10-CM

## 2020-11-13 DIAGNOSIS — B349 Viral infection, unspecified: Secondary | ICD-10-CM | POA: Diagnosis not present

## 2020-11-13 LAB — POCT URINALYSIS DIPSTICK, ED / UC
Glucose, UA: NEGATIVE mg/dL
Hgb urine dipstick: NEGATIVE
Ketones, ur: 40 mg/dL — AB
Leukocytes,Ua: NEGATIVE
Nitrite: NEGATIVE
Protein, ur: 30 mg/dL — AB
Specific Gravity, Urine: >=1.03 (ref 1.005–1.030)
Urobilinogen, UA: 0.2 mg/dL (ref 0.0–1.0)
pH: 5 (ref 5.0–8.0)

## 2020-11-13 LAB — POC URINE PREG, ED: Preg Test, Ur: NEGATIVE

## 2020-11-13 MED ORDER — ONDANSETRON 4 MG PO TBDP
4.0000 mg | ORAL_TABLET | Freq: Once | ORAL | Status: AC
  Administered 2020-11-13: 4 mg via ORAL

## 2020-11-13 MED ORDER — ONDANSETRON 4 MG PO TBDP
ORAL_TABLET | ORAL | Status: AC
  Filled 2020-11-13: qty 1

## 2020-11-13 NOTE — Discharge Instructions (Signed)
Rest, push fluids, take your Zofran that you have at home as directed.  If your symptoms persist or do not improve please follow-up with emergency room for further evaluation and IV fluids.

## 2020-11-13 NOTE — ED Provider Notes (Signed)
[facility]    CSN: [id number] Arrival date & time: 11/13/20  1541      History   Chief Complaint Chief Complaint  Patient presents with   Fatigue   Emesis   Nausea    HPI Alexis [name] is a 25 y.o. female.   25 year old female presents to urgent care chief complaint of fatigue, nausea ,and emesis x1.  Patient states she took 2 COVID test at home which were negative.  Known COVID exposure at work.  Patient has history of tetralogy of Fallot as well as mental health diagnosis.  The history is provided by the patient. No language interpreter was used.   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   Schizoaffective disorder (HCC)    Tetralogy of Fallot     Patient Active Problem List   Diagnosis Date Noted   Dehydration 11/13/2020   Nonspecific syndrome suggestive of viral illness 11/13/2020   Depression 09/24/2020   Polypharmacy 06/30/2020   Chronic migraine without aura without status migrainosus, not intractable 06/30/2020   History of open heart surgery 06/30/2020   Suicidal ideation 02/12/2020   Bipolar I disorder (HCC) 02/12/2020   Schizoaffective disorder (HCC) 02/11/2020   Vitamin D deficiency 11/02/2019   GAD (generalized anxiety disorder) 03/13/2018   MDD (major depressive disorder) 03/13/2018   Chronic migraine without aura 08/31/2012   Insomnia, unspecified 08/31/2012   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   Adjustment disorder 07/26/2011   Abdominal pain 07/26/2011    Past Surgical History:  Procedure Laterality Date   BIOPSY  03/08/2019   Procedure: BIOPSY;  Surgeon: Jeani Hawking, MD;  Location: [facility];  Service: Endoscopy;;   CARDIAC SURGERY     CARDIAC  SURGERY     4 open heart surgeries   ESOPHAGOGASTRODUODENOSCOPY (EGD) WITH PROPOFOL N/A 03/08/2019   Procedure: ESOPHAGOGASTRODUODENOSCOPY (EGD) WITH PROPOFOL;  Surgeon: Jeani Hawking, MD;  Location: [facility];  Service: Endoscopy;  Laterality: N/A;   GASTROSTOMY W/ FEEDING TUBE     removed 1 year ago    THORACIC DUCT LIGATION      OB History   No obstetric history on file.      Home Medications    Prior to Admission medications   Medication Sig Start Date End Date Taking? Authorizing Provider  cyclobenzaprine (FLEXERIL) 5 MG tablet Take 1 tablet (5 mg total) by mouth 2 (two) times daily as needed for muscle spasms. Do not drink alcohol or drive while taking this medication.  May cause drowsiness. 10/08/20   Particia Nearing, PA-C  EPINEPHrine 0.3 mg/0.3 mL IJ SOAJ injection Inject 0.3 mLs (0.3 mg total) into the muscle as needed for anaphylaxis. 07/26/19   Moshe Cipro, NP  Erenumab-aooe (AIMOVIG) 70 MG/ML SOAJ Inject 70 mg into the skin every 30 (thirty) days. 07/01/20   Levert Feinstein, MD  lamoTRIgine (LAMICTAL) 200 MG tablet Take 200 mg by mouth daily. 07/11/20   [provider]  LO LOESTRIN FE 1 MG-10 MCG / 10 MCG tablet Take 1 tablet by mouth daily. 07/11/20   [provider]  ondansetron (ZOFRAN ODT) 4 MG disintegrating tablet Take 1 tablet (4  mg total) by mouth every 8 (eight) hours as needed. 06/30/20   Levert Feinstein, MD  predniSONE (DELTASONE) 20 MG tablet Take 2 tablets (40 mg total) by mouth daily with breakfast. 10/08/20   Particia Nearing, PA-C  QUEtiapine (SEROQUEL) 25 MG tablet Take 75 mg by mouth at bedtime.    [provider]  [name] 2 MG TABS tablet Take 2 mg by mouth daily. 06/23/20   [provider]  SUMAtriptan (IMITREX) 100 MG tablet Take 1 tablet (100 mg total) by mouth once as needed for up to 1 dose for migraine. May repeat in 2 hours if headache persists or recurs. 09/24/20   Levert Feinstein, MD    Family History Family  History  Problem Relation Age of Onset   Hypertension Maternal Grandmother    Diabetes Maternal Grandfather    Heart disease Maternal Grandfather    Stroke Maternal Grandfather    Hypertension Mother    Healthy Father     Social History Social History   Tobacco Use   Smoking status: Never   Smokeless tobacco: Never  Vaping Use   Vaping Use: Never used  Substance Use Topics   Alcohol use: Yes    Comment: occas   Drug use: Not Currently    Types: Marijuana    Comment: Occas.  Hemp     Allergies   Dopamine and Peanut-containing drug products   Review of Systems Review of Systems  Constitutional:  Positive for appetite change and fatigue. Negative for fever.  Gastrointestinal:  Positive for nausea and vomiting. Negative for abdominal pain, constipation and diarrhea.  Psychiatric/Behavioral:  The patient is nervous/anxious.   All other systems reviewed and are negative.   Physical Exam Triage Vital Signs ED Triage Vitals  Enc Vitals Group     BP 11/13/20 1632 124/75     Pulse Rate 11/13/20 1632 94     Resp 11/13/20 1632 18     Temp 11/13/20 1632 98.5 F (36.9 C)     Temp Source 11/13/20 1632 Oral     SpO2 11/13/20 1632 99 %     Weight --      Height --      Head Circumference --      Peak Flow --      Pain Score 11/13/20 1633 0     Pain Loc --      Pain Edu? --      Excl. in GC? --    No data found.  Updated Vital Signs BP 124/75 (BP Location: Right Arm)   Pulse 94   Temp 98.5 F (36.9 C) (Oral)   Resp 18   SpO2 99%   Visual Acuity Right Eye Distance:   Left Eye Distance:   Bilateral Distance:    Right Eye Near:   Left Eye Near:    Bilateral Near:     Physical Exam Vitals and nursing note reviewed.  Constitutional:      General: She is not in acute distress.    Appearance: Normal appearance. She is well-developed and well-groomed.  HENT:     Head: Normocephalic and atraumatic.  Eyes:     Conjunctiva/sclera: Conjunctivae normal.   Cardiovascular:     Rate and Rhythm: Normal rate and regular rhythm.     Pulses: Normal pulses.     Heart sounds: Murmur heard.     Comments: History of tetralogy of Fallot Pulmonary:     Effort: Pulmonary effort is normal. No respiratory distress.  Breath sounds: Normal breath sounds and air entry.  Abdominal:     General: Bowel sounds are normal.     Palpations: Abdomen is soft.     Tenderness: There is no abdominal tenderness.  Musculoskeletal:     Cervical back: Neck supple.  Skin:    General: Skin is warm and dry.     Capillary Refill: Capillary refill takes less than 2 seconds.  Neurological:     General: No focal deficit present.     Mental Status: She is alert and oriented to person, place, and time.     GCS: GCS eye subscore is 4. GCS verbal subscore is 5. GCS motor subscore is 6.  Psychiatric:        Attention and Perception: Attention normal.        Mood and Affect: Mood normal.        Speech: Speech normal.        Behavior: Behavior normal. Behavior is cooperative.     UC Treatments / Results  Labs (all labs ordered are listed, but only abnormal results are displayed) Labs Reviewed  POCT URINALYSIS DIPSTICK, ED / UC - Abnormal; Notable for the following components:      Result Value   Bilirubin Urine SMALL (*)    Ketones, ur 40 (*)    Protein, ur 30 (*)    All other components within normal limits  POC URINE PREG, ED    EKG   Radiology No results found.  Procedures Procedures (including critical care time)  Medications Ordered in UC Medications  ondansetron (ZOFRAN-ODT) disintegrating tablet 4 mg (4 mg Oral Given 11/13/20 1653)    Initial Impression / Assessment and Plan / UC Course  I have reviewed the triage vital signs and the nursing notes.  Pertinent labs & imaging results that were available during my care of the patient were reviewed by me and considered in my medical decision making (see chart for details).     Ddx: Mild  dehydration ,viral illness, mental health disorder(schizoaffective,bipolar, depression) Final Clinical Impressions(s) / UC Diagnoses   Final diagnoses:  Dehydration  Nonspecific syndrome suggestive of viral illness     Discharge Instructions      Rest, push fluids, take your Zofran that you have at home as directed.  If your symptoms persist or do not improve please follow-up with emergency room for further evaluation and IV fluids.      ED Prescriptions   None    PDMP not reviewed this encounter.   Clancy Gourd, NP 11/13/20 Serena Croissant

## 2020-11-13 NOTE — ED Triage Notes (Signed)
Pt present fatigue and vomiting, pt state on Monday she was severely fatigue and then today she vomited twice bile and water.  Pt state that she took two at home covid test that was negative.

## 2020-11-18 DIAGNOSIS — F250 Schizoaffective disorder, bipolar type: Secondary | ICD-10-CM | POA: Diagnosis not present

## 2020-11-18 DIAGNOSIS — F4310 Post-traumatic stress disorder, unspecified: Secondary | ICD-10-CM | POA: Diagnosis not present

## 2020-11-19 ENCOUNTER — Other Ambulatory Visit: Payer: Self-pay

## 2020-11-19 ENCOUNTER — Ambulatory Visit: Payer: Federal, State, Local not specified - PPO | Admitting: Medical

## 2020-11-19 ENCOUNTER — Telehealth: Payer: Self-pay

## 2020-11-19 VITALS — BP 120/70 | HR 92 | Temp 97.4°F | Wt 122.0 lb

## 2020-11-19 DIAGNOSIS — R112 Nausea with vomiting, unspecified: Secondary | ICD-10-CM

## 2020-11-19 DIAGNOSIS — E860 Dehydration: Secondary | ICD-10-CM | POA: Diagnosis not present

## 2020-11-19 DIAGNOSIS — R109 Unspecified abdominal pain: Secondary | ICD-10-CM

## 2020-11-19 LAB — POCT URINALYSIS DIP (PROADVANTAGE DEVICE)
Glucose, UA: NEGATIVE mg/dL
Leukocytes, UA: NEGATIVE
Nitrite, UA: NEGATIVE
Specific Gravity, Urine: 1.03
Urobilinogen, Ur: NEGATIVE
pH, UA: 5.5 (ref 5.0–8.0)

## 2020-11-19 LAB — POCT UA - MICROSCOPIC ONLY

## 2020-11-19 MED ORDER — PROMETHAZINE HCL 25 MG PO TABS: 25.0000 mg | ORAL_TABLET | Freq: Three times a day (TID) | ORAL | 0 refills | Status: DC | PRN

## 2020-11-19 NOTE — Progress Notes (Signed)
Subjective:  Alexis [name] is a 25 y.o. female who presents for Chief Complaint  Patient presents with   ER follow-up    ER follow-up. Started last Monday, light headed, dizziness, chest pain. Took covid test negative, very nausea, exhausted. Started throwing up Thursday. Went to UC- not covid, not pregnant, was told she was dehyrated Saturday- fainted, thrown up. Can't eat as if anything touches stomach she will throw up and get sick     Here for urgent care follow-up.  She went to the emergency department on November 13, 2020 for variety of symptoms including fatigue, nausea, vomiting.  A review of the emergency department notes that she has significant underlying health issues including mental health issues.  Diagnosis at the emergency department on November 13, 2020 included dehydration, nonspecific symptoms suggestive of viral illness, underlying schizoaffective disorder, bipolar and depression she was advised rest, hydration, Zofran.  Currently she reports decreased appetite, nausea, vomiting, feels weak, dizzy, lightheaded, stomach pain every time she eats.  Symptoms began 9 days ago.   No cough, no runny nose , no sneezing, no sore throat.  No blood in stool, no diarrhea.  No urinary hesitance, no burning with urination but is having some urinary frequency.  No urgency.  No blood in urine, no odor in urine.    Still using Zofran but its not helping.  No recent changes in her routine medications.   Has hx/o migraines, and has had some lately.    Has some back pain.  No sick contacts with similar symptoms.    No dyspnea, no chest pain.  However, symptoms began with chest pain as well as the other symptoms above.  Last menstrual period is current.  She is on her period now.  Her birth control normally prevents menses that she missed a few days last week which she thinks prompted the bleeding.  Her last sexual activity was about 2 months ago.  She had a negative pregnancy test at the urgent  care  She notes compliance with medicaiton listed including birth control.    No alcohol use  No other aggravating or relieving factors.    No other c/o.  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   Schizoaffective disorder (HCC)    Tetralogy of Fallot    Current Outpatient Medications on File Prior to Visit  Medication Sig Dispense Refill   EPINEPHrine 0.3 mg/0.3 mL IJ SOAJ injection Inject 0.3 mLs (0.3 mg total) into the muscle as needed for anaphylaxis. 1 each 1   Erenumab-aooe (AIMOVIG) 70 MG/ML SOAJ Inject 70 mg into the skin every 30 (thirty) days. 1 mL 11   lamoTRIgine (LAMICTAL) 200 MG tablet Take 200 mg by mouth daily.     LO LOESTRIN FE 1 MG-10 MCG / 10 MCG tablet Take 1 tablet by mouth daily.     ondansetron (ZOFRAN ODT) 4 MG disintegrating tablet Take 1 tablet (4 mg total) by mouth every 8 (eight) hours as needed. 20 tablet 6   QUEtiapine (SEROQUEL) 25 MG tablet Take 75 mg by mouth at bedtime.     SUMAtriptan (IMITREX) 100 MG tablet Take 1 tablet (  100 mg total) by mouth once as needed for up to 1 dose for migraine. May repeat in 2 hours if headache persists or recurs. 12 tablet 6   No current facility-administered medications on file prior to visit.     The following portions of the patient's history were reviewed and updated as appropriate: allergies, current medications, past family history, past medical history, past social history, past surgical history and problem list.  ROS Otherwise as in subjective above  Objective: BP 120/70   Pulse 92   Temp (!) 97.4 F (36.3 C)   Wt 122 lb (55.3 kg)   SpO2 98%   BMI 20.30 kg/m   Wt Readings from Last 3 Encounters:  11/19/20 122 lb (55.3 kg)   09/24/20 128 lb (58.1 kg)  06/30/20 133 lb (60.3 kg)    General appearance: alert, no distress, well developed, lean African American female HEENT: normocephalic, sclerae anicteric, conjunctiva pink and moist, TMs pearly, nares patent, no discharge or erythema, pharynx normal Oral cavity: MMM, no lesions Neck: supple, no lymphadenopathy, no thyromegaly, no masses Heart: Mild murmur systolic and diastolic and heart sounds more distinct on the right side, has underlying history of tetralogy of Fallot but regular rate and rhythm  lungs: CTA bilaterally, no wheezes, rhonchi, or rales Abdomen: +bs, soft, upper abdomen midline surgical scars leading up to the chest, roundish surgical scar left abdomen upper from prior feeding tube, left lower linear surgical scar, mild epigastric tenderness, non tender, non distended, no masses, no hepatomegaly, no splenomegaly Pulses: 2+ radial pulses, 2+ pedal pulses, normal cap refill Ext: no edema   Assessment: Encounter Diagnoses  Name Primary?   Nausea and vomiting, intractability of vomiting not specified, unspecified vomiting type Yes   Abdominal discomfort    Dehydration      Plan: Discussed her symptoms and concerns.  I reviewed over her emergency department notes from 11/13/2020.  They felt she was dehydrated and had viral syndrome.  She is not improving with the nausea though despite Zofran.  She has not hydrating very well  I still suspect dehydration is the biggest problem currently along with likely getting over viral syndrome.  No obvious sign of urinary tract infection, no concern for pancreatitis or gallbladder disease.  We discussed differential in general.  Labs today as below.  We discussed the following recommendations.  She does have family at home that we will keep an eye on her over the next 24 hours to watch for any worsening or improvement  Patient Instructions   Encounter Diagnoses  Name Primary?   Nausea and vomiting,  intractability of vomiting not specified, unspecified vomiting type Yes   Abdominal discomfort    Dehydration      You still appear to be dehydrated.  Your symptoms are not improving  We will check some labs today  Stop Zofran and change to promethazine Phenergan which works stronger for nausea.  You can use 1/2 to 1 tablet every 6-8 hours for nausea below start out with 1/2 tablet now when you get home.  This medicine will make you sleepy.  So make sure a family member at home can keep an eye on you for the next 24 hours  I need you to push fluids throughout the day every hour as the nausea subsides.  I recommend at least 100 ounces per day over the next few days including Gatorade, water, soup broth, Pedialyte or other electrolytes.  This can include popsicles, soup, pickles, Jell-O and others.  We will call back with labs and give me an update on her symptoms when we call back.  If not improving at all over the next 48 hours we may need to consider emergency department for IV fluids  If you develop more significant belly pain let me know as you might would need to do a scan   Alexis was seen today for er follow-up.  Diagnoses and all orders for this visit:  Nausea and vomiting, intractability of vomiting not specified, unspecified vomiting type -     POCT Urinalysis DIP (Proadvantage Device) -     POCT UA - Microscopic Only -     Comprehensive metabolic panel -     CBC with Differential/Platelet  Abdominal discomfort -     Comprehensive metabolic panel -     CBC with Differential/Platelet  Dehydration -     Comprehensive metabolic panel -     CBC with Differential/Platelet  Other orders -     promethazine (PHENERGAN) 25 MG tablet; Take 1 tablet (25 mg total) by mouth every 8 (eight) hours as needed for nausea or vomiting.   Follow up: pending labs

## 2020-11-19 NOTE — Patient Instructions (Signed)
Encounter Diagnoses  Name Primary?   Nausea and vomiting, intractability of vomiting not specified, unspecified vomiting type Yes   Abdominal discomfort    Dehydration      You still appear to be dehydrated.  Your symptoms are not improving  We will check some labs today  Stop Zofran and change to promethazine Phenergan which works stronger for nausea.  You can use 1/2 to 1 tablet every 6-8 hours for nausea below start out with 1/2 tablet now when you get home.  This medicine will make you sleepy.  So make sure a family member at home can keep an eye on you for the next 24 hours  I need you to push fluids throughout the day every hour as the nausea subsides.  I recommend at least 100 ounces per day over the next few days including Gatorade, water, soup broth, Pedialyte or other electrolytes.  This can include popsicles, soup, pickles, Jell-O and others.  We will call back with labs and give me an update on her symptoms when we call back.  If not improving at all over the next 48 hours we may need to consider emergency department for IV fluids  If you develop more significant belly pain let me know as you might would need to do a scan

## 2020-11-20 LAB — CBC WITH DIFFERENTIAL/PLATELET
Basophils Absolute: 0 10*3/uL (ref 0.0–0.2)
Basos: 1 %
EOS (ABSOLUTE): 0.1 10*3/uL (ref 0.0–0.4)
Eos: 4 %
Hematocrit: 46.8 % — ABNORMAL HIGH (ref 34.0–46.6)
Hemoglobin: 16.1 g/dL — ABNORMAL HIGH (ref 11.1–15.9)
Immature Grans (Abs): 0 10*3/uL (ref 0.0–0.1)
Immature Granulocytes: 0 %
Lymphocytes Absolute: 0.9 10*3/uL (ref 0.7–3.1)
Lymphs: 33 %
MCH: 30.6 pg (ref 26.6–33.0)
MCHC: 34.4 g/dL (ref 31.5–35.7)
MCV: 89 fL (ref 79–97)
Monocytes Absolute: 0.4 10*3/uL (ref 0.1–0.9)
Monocytes: 13 %
Neutrophils Absolute: 1.4 10*3/uL (ref 1.4–7.0)
Neutrophils: 49 %
Platelets: 144 10*3/uL — ABNORMAL LOW (ref 150–450)
RBC: 5.26 x10E6/uL (ref 3.77–5.28)
RDW: 12.7 % (ref 11.7–15.4)
WBC: 2.9 10*3/uL — ABNORMAL LOW (ref 3.4–10.8)

## 2020-11-20 LAB — COMPREHENSIVE METABOLIC PANEL
ALT: 14 IU/L (ref 0–32)
AST: 14 IU/L (ref 0–40)
Albumin/Globulin Ratio: 2.2 (ref 1.2–2.2)
Albumin: 4.6 g/dL (ref 3.9–5.0)
Alkaline Phosphatase: 64 IU/L (ref 44–121)
BUN/Creatinine Ratio: 7 — ABNORMAL LOW (ref 9–23)
BUN: 8 mg/dL (ref 6–20)
Bilirubin Total: 0.5 mg/dL (ref 0.0–1.2)
CO2: 20 mmol/L (ref 20–29)
Calcium: 9.7 mg/dL (ref 8.7–10.2)
Chloride: 102 mmol/L (ref 96–106)
Creatinine, Ser: 1.12 mg/dL — ABNORMAL HIGH (ref 0.57–1.00)
Globulin, Total: 2.1 g/dL (ref 1.5–4.5)
Glucose: 80 mg/dL (ref 65–99)
Potassium: 4.4 mmol/L (ref 3.5–5.2)
Sodium: 140 mmol/L (ref 134–144)
Total Protein: 6.7 g/dL (ref 6.0–8.5)
eGFR: 70 mL/min/{1.73_m2} (ref >59)

## 2020-11-24 ENCOUNTER — Emergency Department (HOSPITAL_COMMUNITY): Payer: Federal, State, Local not specified - PPO

## 2020-11-24 ENCOUNTER — Emergency Department (HOSPITAL_COMMUNITY)
Admission: EM | Admit: 2020-11-24 | Discharge: 2020-11-25 | Disposition: A | Discharge status: Home / Self Care | Payer: Federal, State, Local not specified - PPO | Attending: Emergency Medicine | Admitting: Emergency Medicine

## 2020-11-24 DIAGNOSIS — R0789 Other chest pain: Secondary | ICD-10-CM | POA: Insufficient documentation

## 2020-11-24 DIAGNOSIS — R079 Chest pain, unspecified: Secondary | ICD-10-CM

## 2020-11-24 DIAGNOSIS — R0602 Shortness of breath: Secondary | ICD-10-CM | POA: Insufficient documentation

## 2020-11-24 LAB — CBC WITH DIFFERENTIAL/PLATELET
Abs Immature Granulocytes: 0.02 10*3/uL (ref 0.00–0.07)
Basophils Absolute: 0 10*3/uL (ref 0.0–0.1)
Basophils Relative: 1 %
Eosinophils Absolute: 0.1 10*3/uL (ref 0.0–0.5)
Eosinophils Relative: 3 %
HCT: 46.2 % — ABNORMAL HIGH (ref 36.0–46.0)
Hemoglobin: 15.3 g/dL — ABNORMAL HIGH (ref 12.0–15.0)
Immature Granulocytes: 1 %
Lymphocytes Relative: 32 %
Lymphs Abs: 1.1 10*3/uL (ref 0.7–4.0)
MCH: 30.3 pg (ref 26.0–34.0)
MCHC: 33.1 g/dL (ref 30.0–36.0)
MCV: 91.5 fL (ref 80.0–100.0)
Monocytes Absolute: 0.4 10*3/uL (ref 0.1–1.0)
Monocytes Relative: 13 %
Neutro Abs: 1.7 10*3/uL (ref 1.7–7.7)
Neutrophils Relative %: 50 %
Platelets: 127 10*3/uL — ABNORMAL LOW (ref 150–400)
RBC: 5.05 MIL/uL (ref 3.87–5.11)
RDW: 12.1 % (ref 11.5–15.5)
WBC: 3.4 10*3/uL — ABNORMAL LOW (ref 4.0–10.5)
nRBC: 0 % (ref 0.0–0.2)

## 2020-11-24 LAB — COMPREHENSIVE METABOLIC PANEL
ALT: 18 U/L (ref 0–44)
AST: 19 U/L (ref 15–41)
Albumin: 3.8 g/dL (ref 3.5–5.0)
Alkaline Phosphatase: 48 U/L (ref 38–126)
Anion gap: 9 (ref 5–15)
BUN: 10 mg/dL (ref 6–20)
CO2: 23 mmol/L (ref 22–32)
Calcium: 9.5 mg/dL (ref 8.9–10.3)
Chloride: 104 mmol/L (ref 98–111)
Creatinine, Ser: 0.93 mg/dL (ref 0.44–1.00)
GFR, Estimated: >60 mL/min (ref >60)
Glucose, Bld: 108 mg/dL — ABNORMAL HIGH (ref 70–99)
Potassium: 4.1 mmol/L (ref 3.5–5.1)
Sodium: 136 mmol/L (ref 135–145)
Total Bilirubin: 0.6 mg/dL (ref 0.3–1.2)
Total Protein: 6.4 g/dL — ABNORMAL LOW (ref 6.5–8.1)

## 2020-11-24 LAB — I-STAT BETA HCG BLOOD, ED (MC, WL, AP ONLY): I-stat hCG, quantitative: <5 m[IU]/mL (ref <5)

## 2020-11-24 LAB — TROPONIN I (HIGH SENSITIVITY)
Troponin I (High Sensitivity): 5 ng/L (ref <18)
Troponin I (High Sensitivity): 3 ng/L (ref <18)

## 2020-11-24 NOTE — ED Triage Notes (Signed)
Pt c/o CP, worsening x2hrs. Hx anxiety, tetralogy of fallot, hx stents/conduit placement 6/10 center of chest, stabbing, starting to hurt to breathe

## 2020-11-24 NOTE — ED Provider Notes (Signed)
Emergency Medicine Provider Triage Evaluation Note  Alexis [name] , a 25 y.o. female  was evaluated in triage.  Pt complains of CP.  Patient has a history of Tetralogy of Fallot. She states this morning she began experiencing constant, central, stabbing, chest pain. Her sx are waxing and waning and her current pain is 6/10. Mild SOB and notes worsening CP when deep breathing. No leg swelling, n/v, diaphoresis, recent long travel, or hemoptysis.   Physical Exam  BP 111/73 (BP Location: Right Arm)   Pulse 93   Temp 99 F (37.2 C)   Resp 16   SpO2 100%  Gen:   Awake, no distress   Resp:  Normal effort  MSK:   Moves extremities without difficulty  Other:  Mild TTP noted overlying the sternum. No overlying skin changes.  Previous well-healed surgical scar.  Medical Decision Making  Medically screening exam initiated at 6:33 PM.  Appropriate orders placed.  Alexis [name] was informed that the remainder of the evaluation will be completed by another provider, this initial triage assessment does not replace that evaluation, and the importance of remaining in the ED until their evaluation is complete.   Placido Sou, PA-C 11/24/20 1837    Milagros Loll, MD 11/25/20 1537

## 2020-11-25 ENCOUNTER — Other Ambulatory Visit: Payer: Self-pay

## 2020-11-25 ENCOUNTER — Telehealth: Payer: Self-pay | Admitting: Medical

## 2020-11-25 DIAGNOSIS — F250 Schizoaffective disorder, bipolar type: Secondary | ICD-10-CM | POA: Diagnosis not present

## 2020-11-25 DIAGNOSIS — F4310 Post-traumatic stress disorder, unspecified: Secondary | ICD-10-CM | POA: Diagnosis not present

## 2020-11-25 LAB — D-DIMER, QUANTITATIVE: D-Dimer, Quant: 0.28 ug/mL-FEU (ref 0.00–0.50)

## 2020-11-25 NOTE — Telephone Encounter (Signed)
See prior message.  As I wrote the message, I see she went back to emergency dept.    Did they suggest her go back to gastroenterology or get abdominal scan as outpatient?  What did they feel was causing her pain?  Regarding the blood counts, we can plan to see her back and check a few other things in that regard.   She may ultimately need to see hematology for consult, but there are some things we can check first.   Vincenza Hews

## 2020-11-25 NOTE — Telephone Encounter (Signed)
Pt is schedule for next thursday

## 2020-11-25 NOTE — Discharge Instructions (Addendum)
You were evaluated in the Emergency Department and after careful evaluation, we did not find any emergent condition requiring admission or further testing in the hospital.  Your exam/testing today was overall reassuring.  Recommend Tylenol or Motrin at home for discomfort.  Pain likely related to chest wall inflammation/strain or sprain.  Recommend informing your cardiologist of your symptoms and ED visit.  Please return to the Emergency Department if you experience any worsening of your condition.  Thank you for allowing [place] to be a part of your care.

## 2020-11-25 NOTE — Telephone Encounter (Signed)
Please call patient.   She my chart messaged me but still having lots of symptoms  If very weak, go back to emergency dept  If some improved, I recommend either scan of abdomen or urgent referral to gastroenterology since she still has all these symptoms  I assume no pelvic pain, heavy period bleeding?  What are current symptom?  Where is the pain?  Nausea, vomiting, diarrhea, blood in stool, fever??   Is pain just worse after eating?

## 2020-11-25 NOTE — Telephone Encounter (Signed)
Pt states she thought she was having anxiety and the chest pain kept getting worse throughout the day so that's why she went to ER. She has a history of heart issues, she has been able to keep some food down- ice cream doesn't hurt stomach. She still has no energy and some SOB and some nausea. No fever, vomitting. Patient took off work today. Would you like to see her today.

## 2020-11-25 NOTE — ED Provider Notes (Signed)
[facility]EM[facility]ENCY DEPT [facility] Emergency Department Provider Note MRN:  [id number]  Arrival date & time: 11/25/20     Chief Complaint   Chest Pain   History of Present Illness   Alexis [name] is a 25 y.o. year-old female with a history of schizoaffective disorder, tetralogy of Fallot presenting to the ED with chief complaint of chest.  Sudden onset chest pain and shortness of breath that occurred earlier today, thought maybe it was due to her anxiety and so she took a anxiety pill and this helped somewhat.  The pain returned however, pain is sharp and worse with deep breaths.  Patient takes birth control pills.  She denies any leg pain or swelling, no dizziness or diaphoresis, no nausea vomiting or diarrhea, no abdominal pain.  Pain is worse with palpation of the chest.  Review of Systems  A complete 10 system review of systems was obtained and all systems are negative except as noted in the HPI and PMH.   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   Schizoaffective disorder (HCC)    Tetralogy of Fallot     Past Surgical History:  Procedure Laterality Date   BIOPSY  03/08/2019   Procedure: BIOPSY;  Surgeon: Jeani Hawking, MD;  Location: [facility] ENDOSCOPY;  Service: Endoscopy;;   CARDIAC SURGERY     CARDIAC SURGERY     4 open heart surgeries   ESOPHAGOGASTRODUODENOSCOPY (EGD) WITH PROPOFOL N/A 03/08/2019   Procedure: ESOPHAGOGASTRODUODENOSCOPY (EGD) WITH PROPOFOL;  Surgeon: Jeani Hawking, MD;  Location: [facility] ENDOSCOPY;  Service: Endoscopy;  Laterality: N/A;   GASTROSTOMY W/ FEEDING TUBE     removed 1 year ago    THORACIC DUCT LIGATION      Family History  Problem Relation Age of  Onset   Hypertension Maternal Grandmother    Diabetes Maternal Grandfather    Heart disease Maternal Grandfather    Stroke Maternal Grandfather    Hypertension Mother    Healthy Father     Social History   Socioeconomic History   Marital status: Single    Spouse name: Not on file   Number of children: 0   Years of education: College   Highest education level: Not on file  Occupational History   Occupation: Consulting civil engineer  Tobacco Use   Smoking status: Never   Smokeless tobacco: Never  Vaping Use   Vaping Use: Never used  Substance and Sexual Activity   Alcohol use: Yes    Comment: occas   Drug use: Not Currently    Types: Marijuana    Comment: Occas.  Hemp   Sexual activity: Not Currently    Partners: Male  Other Topics Concern   Not on file  Social History Narrative   Lives at home with mother.   Right-handed.   No more than 2 cups caffeine per day.      Works at [employer]   Social Determinants of Health   Financial Resource Strain: Not on file  Food Insecurity: Not on file  Transportation Needs: Not on file  Physical Activity: Not on file  Stress: Not on file  Social Connections: Not on file  Intimate Partner Violence: Not on file     Physical Exam   Vitals:   11/25/20 0500 11/25/20 0530  BP: (!) 146/93 124/86  Pulse: (!) 58 64  Resp: 11 15  Temp:    SpO2: 100% 100%    CONSTITUTIONAL: Well-appearing, NAD NEURO:  Alert and oriented x 3, no focal deficits EYES:  eyes equal and reactive ENT/NECK:  no LAD, no JVD CARDIO: Regular rate, well-perfused, normal S1 and S2 PULM:  CTAB no wheezing or rhonchi GI/GU:  normal bowel sounds, non-distended, non-tender MSK/SPINE:  No gross deformities, no edema SKIN:  no rash, atraumatic PSYCH:  Appropriate speech and behavior  *Additional and/or pertinent findings included in MDM below  Diagnostic and Interventional Summary    EKG Interpretation  Date/Time:  Monday November 24 2020 18:15:18  EDT Ventricular Rate:  97 PR Interval:  158 QRS Duration: 90 QT Interval:  354 QTC Calculation: 449 R Axis:   79 Text Interpretation: Normal sinus rhythm Nonspecific T wave abnormality Abnormal ECG Confirmed by Kennis Carina [phone]) on 11/25/2020 3:09:47 AM        Labs Reviewed  COMPREHENSIVE METABOLIC PANEL - Abnormal; Notable for the following components:      Result Value   Glucose, Bld 108 (*)    Total Protein 6.4 (*)    All other components within normal limits  CBC WITH DIFFERENTIAL/PLATELET - Abnormal; Notable for the following components:   WBC 3.4 (*)    Hemoglobin 15.3 (*)    HCT 46.2 (*)    Platelets 127 (*)    All other components within normal limits  D-DIMER, QUANTITATIVE  I-STAT BETA HCG BLOOD, ED ([facility], [facility], [facility] ONLY)  TROPONIN I (HIGH SENSITIVITY)  TROPONIN I (HIGH SENSITIVITY)    DG Chest 1 View  Final Result      Medications - No data to display   Procedures  /  Critical Care Procedures  ED Course and Medical Decision Making  I have reviewed the triage vital signs, the nursing notes, and pertinent available records from the EMR.  Listed above are laboratory and imaging tests that I personally ordered, reviewed, and interpreted and then considered in my medical decision making (see below for details).  Normal vital signs, no increased work of breathing, lungs sound clear.  Doubt that her presentation today has involvement with her history of tetralogy of Fallot.  Troponin is negative x2.  EKG without concerning features.  Patient is low risk for PE, will screen with D-dimer.     D-dimer negative, patient continues to look and feel well with normal vital signs, appropriate for discharge.  Elmer Sow. Pilar Plate, MD [facility] Emergency Medicine [facility] [email or web address]  Final Clinical Impressions(s) / ED Diagnoses     ICD-10-CM   1. Chest pain, unspecified type  R07.9     2. Chest pain  R07.9 DG Chest 1 View    DG Chest 1 View       ED Discharge Orders     None        Discharge Instructions Discussed with and Provided to Patient:     Discharge Instructions      You were evaluated in the Emergency Department and after careful evaluation, we did not find any emergent condition requiring admission or further testing in the hospital.  Your exam/testing today was overall reassuring.  Recommend Tylenol or Motrin at home for discomfort.  Pain likely related to chest wall inflammation/strain or  sprain.  Recommend informing your cardiologist of your symptoms and ED visit.  Please return to the Emergency Department if you experience any worsening of your condition.  Thank you for allowing [place] to be a part of your care.         Sabas Sous, MD 11/25/20 [phone]

## 2020-11-25 NOTE — ED Notes (Signed)
Provider at bedside

## 2020-12-02 DIAGNOSIS — F250 Schizoaffective disorder, bipolar type: Secondary | ICD-10-CM | POA: Diagnosis not present

## 2020-12-02 DIAGNOSIS — F4310 Post-traumatic stress disorder, unspecified: Secondary | ICD-10-CM | POA: Diagnosis not present

## 2020-12-04 ENCOUNTER — Other Ambulatory Visit: Payer: Self-pay

## 2020-12-04 ENCOUNTER — Ambulatory Visit: Payer: Federal, State, Local not specified - PPO | Admitting: Medical

## 2020-12-04 VITALS — BP 120/72 | HR 76 | Temp 98.0°F | Wt 126.2 lb

## 2020-12-04 DIAGNOSIS — F329 Major depressive disorder, single episode, unspecified: Secondary | ICD-10-CM

## 2020-12-04 DIAGNOSIS — R5383 Other fatigue: Secondary | ICD-10-CM | POA: Insufficient documentation

## 2020-12-04 DIAGNOSIS — R110 Nausea: Secondary | ICD-10-CM | POA: Insufficient documentation

## 2020-12-04 DIAGNOSIS — Z8774 Personal history of (corrected) congenital malformations of heart and circulatory system: Secondary | ICD-10-CM | POA: Insufficient documentation

## 2020-12-04 DIAGNOSIS — Q249 Congenital malformation of heart, unspecified: Secondary | ICD-10-CM

## 2020-12-04 DIAGNOSIS — B349 Viral infection, unspecified: Secondary | ICD-10-CM

## 2020-12-04 DIAGNOSIS — R112 Nausea with vomiting, unspecified: Secondary | ICD-10-CM

## 2020-12-04 DIAGNOSIS — D696 Thrombocytopenia, unspecified: Secondary | ICD-10-CM | POA: Diagnosis not present

## 2020-12-04 DIAGNOSIS — E559 Vitamin D deficiency, unspecified: Secondary | ICD-10-CM

## 2020-12-04 DIAGNOSIS — R42 Dizziness and giddiness: Secondary | ICD-10-CM | POA: Insufficient documentation

## 2020-12-04 DIAGNOSIS — Z9889 Other specified postprocedural states: Secondary | ICD-10-CM

## 2020-12-04 DIAGNOSIS — G43711 Chronic migraine without aura, intractable, with status migrainosus: Secondary | ICD-10-CM

## 2020-12-04 MED ORDER — OMEPRAZOLE 40 MG PO CPDR: 40.0000 mg | DELAYED_RELEASE_CAPSULE | Freq: Every day | ORAL | 3 refills | Status: DC

## 2020-12-04 NOTE — Progress Notes (Signed)
Subjective:  Alexis [name] is a 25 y.o. female who presents for Chief Complaint  Patient presents with   other    F/u fatigue all the time very tired. SOB chest gets tight has cardiology apt 12/30/20 at 2:30 Dr. Rosiland Oz [facility]     Here for follow-up.  I saw her in early July for nausea, abdominal discomfort, vomiting, fatigue, dizziness.  She had been seen prior to our appointment here for the same at the emergency department.  She also went back to the emergency department on July 11 for same symptoms.  She had labs and imaging at that time.  At this point the vomiting has resolved.  She still has some dizziness but that is improved.  She still has some nausea but has improved.  No fever.  No blood in the stool or urine.  No belly pain or back pain.  She does feel quite a bit of fatigue though.  She notes some shortness of breath.  No palpitations.  No edema.  She has follow-up with her cardiologist in August.  No history of asthma.  No alcohol or tobacco use.  No other aggravating or relieving factors.    No other c/o.  The following portions of the patient's history were reviewed and updated as appropriate: allergies, current medications, past family history, past medical history, past social history, past surgical history and problem list.  ROS Otherwise as in subjective above  Objective: BP 120/72   Pulse 76   Temp 98 F (36.7 C)   Wt 126 lb 3.2 oz (57.2 kg)   BMI 21.00 kg/m   General appearance: alert, no distress, well developed, well nourished, African-American female HEENT: normocephalic, sclerae anicteric, conjunctiva pink and moist, TMs pearly, nares patent, no discharge or erythema, pharynx normal Oral cavity: MMM, no lesions Neck: supple, no lymphadenopathy, no thyromegaly, no masses, no JVD or bruits Heart: Loud click with loud murmur 4 out of 6 heard worse on the right side of the chest, otherwise RRR, normal S1, S2 Lungs: CTA bilaterally, no wheezes, rhonchi, or  rales Abdomen: +bs, soft, upper midline surgical scars, mild epigastric tenderness otherwise non tender, non distended, no masses, no hepatomegaly, no splenomegaly Pulses: 2+ radial pulses, 2+ pedal pulses, normal cap refill Ext: no edema Neuro: Nonfocal exam Psych: Pleasant, answers questions appropriately  November 24, 2020 chest x-ray with no acute cardiopulmonary process   CT abdomen and pelvis with contrast February 20, 2019 IMPRESSION: 3.7 cm right ovarian cyst. No other acute intra-abdominal or pelvic pathology.  Cardiac MRI through [facility] 05/21/2019: Impression: Right ventricle demonstrates normal size (index RVEDV is 79 mL/m2) and preserved function (RVEF is 52%).  Evaluation of the vascular structures is limited without intravenous contrast:  RV to PA conduit is widely patent.  Focal narrowing of the left pulmonary artery suspected and measuring about 0.7 cm with elevated velocity (maximum velocity 251 cm/s) and pressure gradient (25 mmHg).  Right pulmonary artery measures 1.7 cm and demonstrates maximum velocity of 53 cm/s and maximum pressure gradient of 20 mmHg.  Right lower lobe pulmonary arteries appear attenuated.    Assessment: Encounter Diagnoses  Name Primary?   Fatigue, unspecified type Yes   Thrombocytopenia (HCC)    Nausea    Dizzy    Vitamin D deficiency    Nonspecific syndrome suggestive of viral illness    Nausea and vomiting, intractability of vomiting not specified, unspecified vomiting type    Major depressive disorder with current active episode, unspecified depression  episode severity, unspecified whether recurrent    History of open heart surgery    Intractable chronic migraine without aura and with status migrainosus    Congenital anomaly of heart    History of tetralogy of Fallot      Plan: I reviewed over her recent chart records.  She got sick back in late June and went to emergency department June 30.  At that time she had nonspecific symptoms  that suggest a viral illness including dehydration, nausea and vomiting upset stomach, dizziness.  When I saw her on July 6 she continued to appear dehydrated and was not drinking very much fluids, she was still having nausea and vomiting at that time.  She ended up going back to the emergency department on July 11 and had labs and imaging at that time.  The consensus was still the same that this was a viral syndrome and dehydration  She is improving at this point today  So we discussed that the fatigue and dizziness she has been experiencing could just be her body trying to recuperate from being dehydrated and having viral illness.  She is improving overall.  Her vital signs are stable today, lungs clear, no swelling no neuro focal deficits  Looking back through her chart record that we discussed other causes of fatigue.  She recently had labs showing that she is not anemic, her thyroid has not been checked in a while so we are going to recheck thyroid.  She does have depression and is on treatment for this.  She does have underlying congenital heart defect with tetralogy of Fallot and is scheduled to go back and see cardiology for regular checkup next month.  I did review her last echocardiogram and last cardiac notes in the chart record from a couple years ago and I reviewed her MRI cardiac test from last year.  So I do not suspect anything has necessarily changed from a cardiac standpoint.  Her recent x-ray of her lungs was clear  We discussed the fatigue sometimes can be related to vitamin deficiency or thyroid or underlying autoimmune disease.  I will check some additional labs below  Her labs show that she was low on vitamin D significantly last year but only took 1 week of therapy.  Suspect she is still low on vitamin D  She is still little tender in her upper belly.  She says a month ago she was using quite a bit of ibuprofen given some migraines.  We will start omeprazole in the event of  gastritis or ulcer  Her thrombocytopenia is longstanding.  Based on her recollection probably had this even as a child as she recalls having low platelets in low white counts as a young child and saw hematology at some point.  Her platelets are stable but have been low for at least 7 years in our chart record.      Alexis was seen today for other.  Diagnoses and all orders for this visit:  Fatigue, unspecified type -     Hemoglobinopathy Evaluation -     VITAMIN D 25 Hydroxy (Vit-D Deficiency, Fractures) -     TSH -     Iron -     Sedimentation rate -     Hemoglobin Solubility w/ Rflx to Frac  Thrombocytopenia (HCC) -     Hemoglobinopathy Evaluation -     VITAMIN D 25 Hydroxy (Vit-D Deficiency, Fractures) -     TSH -     Iron -  Sedimentation rate -     Hemoglobin Solubility w/ Rflx to Frac  Nausea -     Hemoglobinopathy Evaluation -     VITAMIN D 25 Hydroxy (Vit-D Deficiency, Fractures) -     TSH -     Iron -     Sedimentation rate  Dizzy -     Hemoglobinopathy Evaluation -     VITAMIN D 25 Hydroxy (Vit-D Deficiency, Fractures) -     TSH -     Iron -     Sedimentation rate  Vitamin D deficiency  Nonspecific syndrome suggestive of viral illness  Nausea and vomiting, intractability of vomiting not specified, unspecified vomiting type  Major depressive disorder with current active episode, unspecified depression episode severity, unspecified whether recurrent  History of open heart surgery  Intractable chronic migraine without aura and with status migrainosus  Congenital anomaly of heart  History of tetralogy of Fallot  Other orders -     omeprazole (PRILOSEC) 40 MG capsule; Take 1 capsule (40 mg total) by mouth daily.   Follow up: pending labs

## 2020-12-05 ENCOUNTER — Telehealth: Payer: Self-pay | Admitting: Family Medicine

## 2020-12-05 NOTE — Telephone Encounter (Signed)
Pt called and states that if Alexis [name] needs to reach patient with results on Monday, please call her on her work number between 11:00 and 5:30. Work number is in chart

## 2020-12-08 ENCOUNTER — Other Ambulatory Visit: Payer: Self-pay | Admitting: Medical

## 2020-12-08 LAB — HGB FRACTIONATION CASCADE
Hgb A2: 2.5 % (ref 1.8–3.2)
Hgb A: 97.5 % (ref 96.4–98.8)
Hgb F: 0 % (ref 0.0–2.0)
Hgb S: 0 %

## 2020-12-08 LAB — IRON: Iron: 101 ug/dL (ref 27–159)

## 2020-12-08 LAB — VITAMIN D 25 HYDROXY (VIT D DEFICIENCY, FRACTURES): Vit D, 25-Hydroxy: 16 ng/mL — ABNORMAL LOW (ref 30.0–100.0)

## 2020-12-08 LAB — SEDIMENTATION RATE: Sed Rate: 4 mm/hr (ref 0–32)

## 2020-12-08 LAB — TSH: TSH: 1.55 u[IU]/mL (ref 0.450–4.500)

## 2020-12-08 MED ORDER — VITAMIN D (ERGOCALCIFEROL) 1.25 MG (50000 UNIT) PO CAPS: 50000.0000 [IU] | ORAL_CAPSULE | ORAL | 1 refills | Status: DC

## 2020-12-09 DIAGNOSIS — F250 Schizoaffective disorder, bipolar type: Secondary | ICD-10-CM | POA: Diagnosis not present

## 2020-12-09 DIAGNOSIS — F4310 Post-traumatic stress disorder, unspecified: Secondary | ICD-10-CM | POA: Diagnosis not present

## 2020-12-11 DIAGNOSIS — F259 Schizoaffective disorder, unspecified: Secondary | ICD-10-CM | POA: Diagnosis not present

## 2020-12-13 NOTE — Telephone Encounter (Signed)
error 

## 2020-12-16 DIAGNOSIS — F250 Schizoaffective disorder, bipolar type: Secondary | ICD-10-CM | POA: Diagnosis not present

## 2020-12-16 DIAGNOSIS — F4310 Post-traumatic stress disorder, unspecified: Secondary | ICD-10-CM | POA: Diagnosis not present

## 2020-12-18 ENCOUNTER — Telehealth: Payer: Self-pay

## 2020-12-18 NOTE — Telephone Encounter (Signed)
PA request for Aimovig 70mg /mL on CMM,   Received instant approval, The authorization is valid from 11/18/2020 through 06/16/2021.

## 2020-12-23 DIAGNOSIS — F4310 Post-traumatic stress disorder, unspecified: Secondary | ICD-10-CM | POA: Diagnosis not present

## 2020-12-23 DIAGNOSIS — F250 Schizoaffective disorder, bipolar type: Secondary | ICD-10-CM | POA: Diagnosis not present

## 2020-12-25 ENCOUNTER — Ambulatory Visit: Payer: Federal, State, Local not specified - PPO | Admitting: Neurology

## 2020-12-30 DIAGNOSIS — Q255 Atresia of pulmonary artery: Secondary | ICD-10-CM | POA: Diagnosis not present

## 2020-12-30 DIAGNOSIS — F259 Schizoaffective disorder, unspecified: Secondary | ICD-10-CM | POA: Diagnosis not present

## 2020-12-30 DIAGNOSIS — Q213 Tetralogy of Fallot: Secondary | ICD-10-CM | POA: Diagnosis not present

## 2020-12-30 DIAGNOSIS — Z6821 Body mass index (BMI) 21.0-21.9, adult: Secondary | ICD-10-CM | POA: Diagnosis not present

## 2020-12-30 DIAGNOSIS — Q210 Ventricular septal defect: Secondary | ICD-10-CM | POA: Diagnosis not present

## 2020-12-31 DIAGNOSIS — F250 Schizoaffective disorder, bipolar type: Secondary | ICD-10-CM | POA: Diagnosis not present

## 2020-12-31 DIAGNOSIS — F4310 Post-traumatic stress disorder, unspecified: Secondary | ICD-10-CM | POA: Diagnosis not present

## 2021-01-08 DIAGNOSIS — F250 Schizoaffective disorder, bipolar type: Secondary | ICD-10-CM | POA: Diagnosis not present

## 2021-01-08 DIAGNOSIS — F4310 Post-traumatic stress disorder, unspecified: Secondary | ICD-10-CM | POA: Diagnosis not present

## 2021-01-13 DIAGNOSIS — F4310 Post-traumatic stress disorder, unspecified: Secondary | ICD-10-CM | POA: Diagnosis not present

## 2021-01-13 DIAGNOSIS — F250 Schizoaffective disorder, bipolar type: Secondary | ICD-10-CM | POA: Diagnosis not present

## 2021-01-14 ENCOUNTER — Encounter: Payer: Federal, State, Local not specified - PPO | Admitting: Family Medicine

## 2021-01-25 NOTE — Progress Notes (Signed)
Subjective:    Patient ID: Alexis [name], female    DOB: 1995-10-28, 25 y.o.   MRN: [id number]  HPI Chief Complaint  Patient presents with   Annual Exam    Easy bruising more often and lingering longer    She is here for a complete physical exam.  Concerns regarding easy bruising with a Hx of thrombocytopenia.   She has not seen a hematologist.   Grandmother and grandfather have had strokes and she is concerned about her risk in the future.   States she is having a cardiac cath soon with her cardiologist in [place].   She is under the care of a psychiatrist for schizoaffective disorder. No longer has issue with cutting but she is restricting calories at times.    Other providers: Dr. Ples Specter OB/GYN  Cardiologist- Rosiland Oz  Psychiatry- Melony Overly  GI- Dr. Elnoria Howard    Vitamin D def- she is taking a prescription supplement once weekly    Social history: Lives with grandmother. works at [employer]. Denies smoking, drinking alcohol, drug use  Diet: admits to limiting her calories and afraid to gain weight Excerise: none. Very active at work.   Immunizations: flu shot- today.  Had 3 Covid vaccines last one 05/16/2020.  HPV vaccines at a child per patient.   Health maintenance:   Last Gynecological Exam: lat year  Last Menstrual cycle: started yesterday  Last Dental Exam: UTD  Last Eye Exam: last year   Wears seatbelt always, smoke detectors in home and functioning, does not text while driving and feels safe in home environment.   Reviewed allergies, medications, past medical, surgical, family, and social history.    Review of Systems Review of Systems Constitutional: -fever, -chills, -sweats, -unexpected weight change,-fatigue ENT: -runny nose, -ear pain, -sore throat Cardiology:  -chest pain, -palpitations, -edema Respiratory: -cough, -shortness of breath, -wheezing Gastroenterology: -abdominal pain, -nausea, -vomiting, -diarrhea,  -constipation  Hematology: -bleeding or bruising problems Musculoskeletal: -arthralgias, -myalgias, -joint swelling, -back pain Ophthalmology: -vision changes Urology: -dysuria, -difficulty urinating, -hematuria, -urinary frequency, -urgency Neurology: -headache, -weakness, -tingling, -numbness       Objective:   Physical Exam BP 106/64 (BP Location: Left Arm, Patient Position: Sitting, Cuff Size: Normal)   Pulse 81   Temp 98.4 F (36.9 C) (Oral)   Ht 5\' 5"  (1.651 m)   Wt 131 lb 9.6 oz (59.7 kg)   SpO2 98%   BMI 21.90 kg/m   General Appearance:    Alert, cooperative, no distress, appears stated age  Head:    Normocephalic, without obvious abnormality, atraumatic  Eyes:    PERRL, conjunctiva/corneas clear, EOM's intact  Ears:    Normal TM's and external ear canals  Nose:   Mask on   Throat:   Mask on   Neck:   Supple, no lymphadenopathy;  thyroid:  no   enlargement/tenderness/nodules  Back:    Spine nontender, no curvature, ROM normal, no CVA     tenderness  Lungs:     Clear to auscultation bilaterally without wheezes, rales or     ronchi; respirations unlabored  Chest Wall:    No tenderness or deformity   Heart:    Regular rate and rhythm, murmur noted  Breast Exam:    OB/GYN   Abdomen:     Soft, non-tender, nondistended, normoactive bowel sounds,    no masses, no hepatosplenomegaly  Genitalia:    OB/GYN   Rectal:    Not performed due to age<40 and  no related complaints  Extremities:   No clubbing, cyanosis or edema  Pulses:   2+ and symmetric all extremities  Skin:   Skin color, texture, turgor normal, no rashes or lesions  Lymph nodes:   Cervical, supraclavicular, and axillary nodes normal  Neurologic:   CNII-XII intact, normal strength, sensation and gait.           Psych:   Normal mood, affect, hygiene and grooming.          Assessment & Plan:  Routine general medical examination at a health care facility - Plan: CBC with Differential/Platelet, Comprehensive  metabolic panel, TSH, T4, free -Preventive health care reviewed.  She sees OB/GYN.  She also has other specialist.  Counseling on healthy lifestyle including diet and exercise.  She will discuss her issues with eating with her psychiatrist in cardiologist who she feels recommend her keeping within a certain BMI range.  Continue regular dental and eye exams.  Immunizations reviewed.  Discussed safety and health promotion.  Vitamin D deficiency - Plan: VITAMIN D 25 Hydroxy (Vit-D Deficiency, Fractures) -Start back on vitamin D supplement.  Follow-up pending results  Bruises easily - Plan: CBC with Differential/Platelet, Comprehensive metabolic panel, Iron, TIBC and Ferritin Panel -Reports bruising has been related to injuries such as working and that and having dogs jumped on her.  Check labs and follow-up  Thrombocytopenia (HCC) - Plan: CBC with Differential/Platelet, Iron, TIBC and Ferritin Panel -Check labs and follow-up.  She is okay with referral to hematology if needed.  History of open heart surgery -Wished her luck on her upcoming cardiac catheterization  Needs flu shot - Plan: Flu Vaccine QUAD 15mo+IM (Fluarix, Fluzone & Alfiuria Quad PF)

## 2021-01-25 NOTE — Patient Instructions (Addendum)
Follow up with your OB/GYN.   We will be in touch with your lab results.    Preventive Care 67-24 Years Old, Female Preventive care refers to lifestyle choices and visits with your health care provider that can promote health and wellness. This includes: A yearly physical exam. This is also called an annual wellness visit. Regular dental and eye exams. Immunizations. Screening for certain conditions. Healthy lifestyle choices, such as: Eating a healthy diet. Getting regular exercise. Not using drugs or products that contain nicotine and tobacco. Limiting alcohol use. What can I expect for my preventive care visit? Physical exam Your health care provider may check your: Height and weight. These may be used to calculate your BMI (body mass index). BMI is a measurement that tells if you are at a healthy weight. Heart rate and blood pressure. Body temperature. Skin for abnormal spots. Counseling Your health care provider may ask you questions about your: Past medical problems. Family's medical history. Alcohol, tobacco, and drug use. Emotional well-being. Home life and relationship well-being. Sexual activity. Diet, exercise, and sleep habits. Work and work Statistician. Access to firearms. Method of birth control. Menstrual cycle. Pregnancy history. What immunizations do I need? Vaccines are usually given at various ages, according to a schedule. Your health care provider will recommend vaccines for you based on your age, medical history, and lifestyle or other factors, such as travel or where you work. What tests do I need? Blood tests Lipid and cholesterol levels. These may be checked every 5 years starting at age 68. Hepatitis C test. Hepatitis B test. Screening Diabetes screening. This is done by checking your blood sugar (glucose) after you have not eaten for a while (fasting). STD (sexually transmitted disease) testing, if you are at risk. BRCA-related cancer  screening. This may be done if you have a family history of breast, ovarian, tubal, or peritoneal cancers. Pelvic exam and Pap test. This may be done every 3 years starting at age 84. Starting at age 10, this may be done every 5 years if you have a Pap test in combination with an HPV test. Talk with your health care provider about your test results, treatment options, and if necessary, the need for more tests. Follow these instructions at home: Eating and drinking  Eat a healthy diet that includes fresh fruits and vegetables, whole grains, lean protein, and low-fat dairy products. Take vitamin and mineral supplements as recommended by your health care provider. Do not drink alcohol if: Your health care provider tells you not to drink. You are pregnant, may be pregnant, or are planning to become pregnant. If you drink alcohol: Limit how much you have to 0-1 drink a day. Be aware of how much alcohol is in your drink. In the [place] one drink equals one 12 oz bottle of beer (355 mL), one 5 oz glass of wine (148 mL), or one 1 oz glass of hard liquor (44 mL). Lifestyle Take daily care of your teeth and gums. Brush your teeth every morning and night with fluoride toothpaste. Floss one time each day. Stay active. Exercise for at least 30 minutes 5 or more days each week. Do not use any products that contain nicotine or tobacco, such as cigarettes, e-cigarettes, and chewing tobacco. If you need help quitting, ask your health care provider. Do not use drugs. If you are sexually active, practice safe sex. Use a condom or other form of protection to prevent STIs (sexually transmitted infections). If you do not wish to  become pregnant, use a form of birth control. If you plan to become pregnant, see your health care provider for a prepregnancy visit. Find healthy ways to cope with stress, such as: Meditation, yoga, or listening to music. Journaling. Talking to a trusted person. Spending time with friends  and family. Safety Always wear your seat belt while driving or riding in a vehicle. Do not drive: If you have been drinking alcohol. Do not ride with someone who has been drinking. When you are tired or distracted. While texting. Wear a helmet and other protective equipment during sports activities. If you have firearms in your house, make sure you follow all gun safety procedures. Seek help if you have been physically or sexually abused. What's next? Go to your health care provider once a year for an annual wellness visit. Ask your health care provider how often you should have your eyes and teeth checked. Stay up to date on all vaccines. This information is not intended to replace advice given to you by your health care provider. Make sure you discuss any questions you have with your health care provider. Document Revised: 07/11/2020 Document Reviewed: 01/12/2018 Elsevier Patient Education  2022 [employer].

## 2021-01-26 ENCOUNTER — Ambulatory Visit (INDEPENDENT_AMBULATORY_CARE_PROVIDER_SITE_OTHER): Payer: Federal, State, Local not specified - PPO | Admitting: Family Medicine

## 2021-01-26 ENCOUNTER — Other Ambulatory Visit: Payer: Self-pay

## 2021-01-26 ENCOUNTER — Encounter: Payer: Self-pay | Admitting: Family Medicine

## 2021-01-26 VITALS — BP 106/64 | HR 81 | Temp 98.4°F | Ht 65.0 in | Wt 131.6 lb

## 2021-01-26 DIAGNOSIS — Z0000 Encounter for general adult medical examination without abnormal findings: Secondary | ICD-10-CM

## 2021-01-26 DIAGNOSIS — R238 Other skin changes: Secondary | ICD-10-CM

## 2021-01-26 DIAGNOSIS — R233 Spontaneous ecchymoses: Secondary | ICD-10-CM

## 2021-01-26 DIAGNOSIS — D696 Thrombocytopenia, unspecified: Secondary | ICD-10-CM | POA: Diagnosis not present

## 2021-01-26 DIAGNOSIS — Z9889 Other specified postprocedural states: Secondary | ICD-10-CM

## 2021-01-26 DIAGNOSIS — E559 Vitamin D deficiency, unspecified: Secondary | ICD-10-CM | POA: Diagnosis not present

## 2021-01-26 DIAGNOSIS — Z23 Encounter for immunization: Secondary | ICD-10-CM | POA: Diagnosis not present

## 2021-01-27 ENCOUNTER — Other Ambulatory Visit: Payer: Self-pay

## 2021-01-27 ENCOUNTER — Encounter: Payer: Self-pay | Admitting: Family Medicine

## 2021-01-27 ENCOUNTER — Other Ambulatory Visit: Payer: Self-pay | Admitting: Family Medicine

## 2021-01-27 DIAGNOSIS — D72819 Decreased white blood cell count, unspecified: Secondary | ICD-10-CM

## 2021-01-27 DIAGNOSIS — D696 Thrombocytopenia, unspecified: Secondary | ICD-10-CM

## 2021-01-27 HISTORY — DX: Decreased white blood cell count, unspecified: D72.819

## 2021-01-27 LAB — IRON,TIBC AND FERRITIN PANEL
Ferritin: 52 ng/mL (ref 15–150)
Iron Saturation: 12 % — ABNORMAL LOW (ref 15–55)
Iron: 39 ug/dL (ref 27–159)
Total Iron Binding Capacity: 322 ug/dL (ref 250–450)
UIBC: 283 ug/dL (ref 131–425)

## 2021-01-27 LAB — CBC WITH DIFFERENTIAL/PLATELET
Basophils Absolute: 0 10*3/uL (ref 0.0–0.2)
Basos: 1 %
EOS (ABSOLUTE): 0.1 10*3/uL (ref 0.0–0.4)
Eos: 3 %
Hematocrit: 41.2 % (ref 34.0–46.6)
Hemoglobin: 13.6 g/dL (ref 11.1–15.9)
Immature Grans (Abs): 0 10*3/uL (ref 0.0–0.1)
Immature Granulocytes: 0 %
Lymphocytes Absolute: 1 10*3/uL (ref 0.7–3.1)
Lymphs: 31 %
MCH: 29.6 pg (ref 26.6–33.0)
MCHC: 33 g/dL (ref 31.5–35.7)
MCV: 90 fL (ref 79–97)
Monocytes Absolute: 0.3 10*3/uL (ref 0.1–0.9)
Monocytes: 9 %
Neutrophils Absolute: 1.8 10*3/uL (ref 1.4–7.0)
Neutrophils: 56 %
Platelets: 134 10*3/uL — ABNORMAL LOW (ref 150–450)
RBC: 4.6 x10E6/uL (ref 3.77–5.28)
RDW: 12.6 % (ref 11.7–15.4)
WBC: 3.3 10*3/uL — ABNORMAL LOW (ref 3.4–10.8)

## 2021-01-27 LAB — COMPREHENSIVE METABOLIC PANEL
ALT: 17 IU/L (ref 0–32)
AST: 17 IU/L (ref 0–40)
Albumin/Globulin Ratio: 2.8 — ABNORMAL HIGH (ref 1.2–2.2)
Albumin: 4.8 g/dL (ref 3.9–5.0)
Alkaline Phosphatase: 76 IU/L (ref 44–121)
BUN/Creatinine Ratio: 17 (ref 9–23)
BUN: 13 mg/dL (ref 6–20)
Bilirubin Total: <0.2 mg/dL (ref 0.0–1.2)
CO2: 19 mmol/L — ABNORMAL LOW (ref 20–29)
Calcium: 9.1 mg/dL (ref 8.7–10.2)
Chloride: 104 mmol/L (ref 96–106)
Creatinine, Ser: 0.76 mg/dL (ref 0.57–1.00)
Globulin, Total: 1.7 g/dL (ref 1.5–4.5)
Glucose: 99 mg/dL (ref 65–99)
Potassium: 4.6 mmol/L (ref 3.5–5.2)
Sodium: 138 mmol/L (ref 134–144)
Total Protein: 6.5 g/dL (ref 6.0–8.5)
eGFR: 112 mL/min/{1.73_m2} (ref >59)

## 2021-01-27 LAB — TSH: TSH: 2.21 u[IU]/mL (ref 0.450–4.500)

## 2021-01-27 LAB — VITAMIN D 25 HYDROXY (VIT D DEFICIENCY, FRACTURES): Vit D, 25-Hydroxy: 13.6 ng/mL — ABNORMAL LOW (ref 30.0–100.0)

## 2021-01-27 LAB — T4, FREE: Free T4: 0.94 ng/dL (ref 0.82–1.77)

## 2021-01-27 MED ORDER — VITAMIN D (ERGOCALCIFEROL) 1.25 MG (50000 UNIT) PO CAPS: 50000.0000 [IU] | ORAL_CAPSULE | ORAL | 1 refills | Status: DC

## 2021-01-27 NOTE — Progress Notes (Signed)
Please put in referral to hematology for evaluation of thrombocytopenia and leukopenia

## 2021-01-28 ENCOUNTER — Encounter: Payer: Self-pay | Admitting: *Deleted

## 2021-01-28 DIAGNOSIS — F259 Schizoaffective disorder, unspecified: Secondary | ICD-10-CM | POA: Diagnosis not present

## 2021-01-28 NOTE — Progress Notes (Signed)
Referral received from [facility] PA-C and records under review for appropriate scheduling

## 2021-01-29 ENCOUNTER — Encounter: Payer: Self-pay | Admitting: *Deleted

## 2021-01-29 ENCOUNTER — Other Ambulatory Visit: Payer: Self-pay | Admitting: *Deleted

## 2021-01-29 DIAGNOSIS — D72810 Lymphocytopenia: Secondary | ICD-10-CM

## 2021-01-29 NOTE — Progress Notes (Signed)
Patient with mild leukopenia scheduled to see Lonna Cobb NP on 10/10 with labs

## 2021-02-03 DIAGNOSIS — F250 Schizoaffective disorder, bipolar type: Secondary | ICD-10-CM | POA: Diagnosis not present

## 2021-02-03 DIAGNOSIS — F4310 Post-traumatic stress disorder, unspecified: Secondary | ICD-10-CM | POA: Diagnosis not present

## 2021-02-10 DIAGNOSIS — F4310 Post-traumatic stress disorder, unspecified: Secondary | ICD-10-CM | POA: Diagnosis not present

## 2021-02-10 DIAGNOSIS — F250 Schizoaffective disorder, bipolar type: Secondary | ICD-10-CM | POA: Diagnosis not present

## 2021-02-17 DIAGNOSIS — F250 Schizoaffective disorder, bipolar type: Secondary | ICD-10-CM | POA: Diagnosis not present

## 2021-02-17 DIAGNOSIS — F4310 Post-traumatic stress disorder, unspecified: Secondary | ICD-10-CM | POA: Diagnosis not present

## 2021-02-20 NOTE — Progress Notes (Addendum)
New Hematology/Oncology Consult   Requesting MD: Hetty Blend, NP  [phone]  Reason for Consult: Leukopenia and thrombocytopenia  HPI: Alexis [name] is a 25 year old woman referred for evaluation of leukopenia and thrombocytopenia.  She had blood work done on 05/05/2021 which showed hemoglobin 13.6, total white count 3.3 and platelet count 134,000.  Labs were reviewed in the electronic medical record.  She has had mild intermittent leukopenia and mild thrombocytopenia for many years.  CBC on 11/12/2018 showed hemoglobin 12.2, white count 4.1, platelet count 141,000.  CBC on 03/07/2016 showed hemoglobin 14.3, total white count 3.3 and platelet count 130,000.  CBC on 11/12/2018 showed hemoglobin 12.2, white count 4.1, platelet count 141,000.  CBC 07/29/2008 shows white count 3.3 and platelet count 143,000.  Past medical history includes tetralogy of Fallot/pulmonary atresia, migraines, schizoaffective disorder.  She has had multiple heart surgeries with the last occurring around 25 years of age.   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   Schizoaffective disorder (HCC)    Tetralogy of Fallot   :   Past Surgical History:  Procedure Laterality Date   BIOPSY  03/08/2019   Procedure: BIOPSY;  Surgeon: Jeani Hawking, MD;  Location: [facility];  Service: Endoscopy;;   CARDIAC SURGERY     CARDIAC SURGERY     4 open heart surgeries   ESOPHAGOGASTRODUODENOSCOPY (EGD) WITH PROPOFOL N/A 03/08/2019   Procedure: ESOPHAGOGASTRODUODENOSCOPY (EGD) WITH PROPOFOL;  Surgeon: Jeani Hawking, MD;  Location: [facility];  Service: Endoscopy;  Laterality: N/A;   GASTROSTOMY W/ FEEDING TUBE     removed 1 year ago     THORACIC DUCT LIGATION    :   Current Outpatient Medications:    busPIRone (BUSPAR) 10 MG tablet, Take 10 mg by mouth 2 (two) times daily., Disp: , Rfl:    EPINEPHrine 0.3 mg/0.3 mL IJ SOAJ injection, Inject 0.3 mLs (0.3 mg total) into the muscle as needed for anaphylaxis., Disp: 1 each, Rfl: 1   ibuprofen (ADVIL) 200 MG tablet, Take 600 mg by mouth every 6 (six) hours as needed for headache or moderate pain., Disp: , Rfl:    JUNEL 1/20 1-20 MG-MCG tablet, Take 1 tablet by mouth daily., Disp: , Rfl:    lamoTRIgine (LAMICTAL) 200 MG tablet, Take 200 mg by mouth daily., Disp: , Rfl:    omeprazole (PRILOSEC) 40 MG capsule, Take 1 capsule (40 mg total) by mouth daily., Disp: 30 capsule, Rfl: 3   ondansetron (ZOFRAN ODT) 4 MG disintegrating tablet, Take 1 tablet (4 mg total) by mouth every 8 (eight) hours as needed., Disp: 20 tablet, Rfl: 6   promethazine (PHENERGAN) 25 MG tablet, Take 1 tablet (25 mg total) by mouth every 8 (eight) hours as needed for nausea or vomiting., Disp: 10 tablet, Rfl: 0   QUEtiapine (SEROQUEL) 100 MG tablet, Take 100 mg by mouth at bedtime., Disp: , Rfl:    Vitamin D, Ergocalciferol, (DRISDOL) 1.25 MG (50000 UNIT) CAPS capsule, Take 1 capsule (50,000 Units total) by mouth every 7 (seven) days., Disp: 12 capsule, Rfl: 1   Erenumab-aooe (AIMOVIG) 70 MG/ML SOAJ, Inject 70 mg into the skin every  30 (thirty) days. (Patient not taking: Reported on 02/23/2021), Disp: 1 mL, Rfl: 11   LO LOESTRIN FE 1 MG-10 MCG / 10 MCG tablet, Take 1 tablet by mouth daily. (Patient not taking: Reported on 02/23/2021), Disp: , Rfl:    SUMAtriptan (IMITREX) 100 MG tablet, Take 1 tablet (100 mg total) by mouth once as needed for up to 1 dose for migraine. May repeat in 2 hours if headache persists or recurs. (Patient not taking: Reported on 02/23/2021), Disp: 12 tablet, Rfl: 6:  :   Allergies  Allergen Reactions   Dopamine     Makes WBC rise cardiac arrest   Peanut-Containing Drug Products      Hazel nuts [place] nuts  :  FH: No family history of a blood disorder.  Maternal grandmother had breast cancer.  Mother and father reported to be healthy.  She has 5 sisters reported to be in good health.  SOCIAL HISTORY: She lives in [place] with her mother and grandmother.  She is in a relationship.  She does not have children.  She is a Production designer, theatre/television/film.  She does not smoke.  Occasional alcohol intake.  Review of Systems: No fevers.  She has periodic sweats that tend to occur with a panic attack or following a nightmare.  She reports having pneumonia in eighth grade.  She has had bronchitis in the past.  She tends to have multiple sinus infections each year.  She has bleeding related to her menstrual cycle.  She has recently noticed easy bruising.  Last week she noted gum bleeding when she brushed her teeth.  She has migraine headaches.  She notes that she needs to wear her glasses continuously.  She has dyspnea on exertion.  She is followed at [facility] and is scheduled for a cardiac catheterization later this week to follow-up on echocardiogram findings.  She has nausea "all the time".  She has been told this is medication related.  Intermittent constipation.  No urinary symptoms.  No rash.  No joint erythema, pain, swelling.  She states that she has a history of "starving" herself.  She does not have a good appetite.  She becomes concerned with weight gain.  She reports schizoaffective disorder characterized by visual and audible hallucinations, anxiety/depression, paranoia, mania, nightmares.  Physical Exam:  Blood pressure 115/75, pulse 82, temperature 98.1 F (36.7 C), temperature source Oral, resp. rate 18, height 5\' 5"  (1.651 m), weight 131 lb 12.8 oz (59.8 kg), SpO2 100 %.  HEENT: No thrush or ulcers. Lungs: Lungs clear bilaterally. Cardiac: Regular.  2/6 systolic murmur. Abdomen: Soft and nontender.  No hepatosplenomegaly. Vascular: No leg edema. Lymph nodes: No palpable cervical,  supraclavicular, or inguinal lymph nodes.  Shotty bilateral axillary nodes. Neurologic: Alert and oriented.  LABS:   Recent Labs    02/23/21 1313  WBC 2.9*  HGB 14.0  HCT 42.3  PLT 141*   Peripheral blood smear-platelets normal in number, no clumps; few ovalocytes, polychromasia not increased; white blood cells unremarkable.  RADIOLOGY:  No results found.  Assessment and Plan:   Mild leukopenia Mild thrombocytopenia Tetralogy of Fallot/pulmonary atresia Schizoaffective disorder Migraines  Alexis [name] is a 25 year old woman referred for evaluation of leukopenia and thrombocytopenia.  Both are chronic, dating to at least 2010.  Dr. 2011 discussed potential etiologies including benign normal variants.  We will review the peripheral blood smear.  Check ANA and B12.  We will contact her once results are available.  We will see her back in 1  year with a CBC.  She understands to contact the office in the interim with any problems.  We specifically discussed spontaneous bruising/bleeding.  Patient seen with Dr. Truett Perna.  Lonna Cobb, NP 02/23/2021, 2:08 PM   This was a shared visit with Lonna Cobb.  Alexis [name] was interviewed and examined.  She is referred for evaluation of leukopenia and thrombocytopenia.  Review of the medical record indicates these hematologic findings have been present at least back to 2010.  I suspect the hematologic findings are benign and potentially related to a normal variant.  We doubt the cytopenias are related to polypharmacy since she did not start the antipsychotic regimen until 2017.  We will check an ANA to look for evidence of an underlying collagen vascular disease, though she has no history to suggest this.  We checked a vitamin B12 level.  There is no evidence of chronic liver disease.  I will review the peripheral blood smear.  I have a low clinical suspicion for hematopoietic malignancy.  I was present greater than 50% of today's visit.  I  performed medical decision making.  Mancel Bale, MD

## 2021-02-23 ENCOUNTER — Inpatient Hospital Stay: Payer: Federal, State, Local not specified - PPO

## 2021-02-23 ENCOUNTER — Inpatient Hospital Stay: Payer: Federal, State, Local not specified - PPO | Attending: Nurse Practitioner | Admitting: Nurse Practitioner

## 2021-02-23 ENCOUNTER — Encounter: Payer: Self-pay | Admitting: Nurse Practitioner

## 2021-02-23 ENCOUNTER — Other Ambulatory Visit: Payer: Self-pay

## 2021-02-23 VITALS — BP 115/75 | HR 82 | Temp 98.1°F | Resp 18 | Ht 65.0 in | Wt 131.8 lb

## 2021-02-23 DIAGNOSIS — Z79899 Other long term (current) drug therapy: Secondary | ICD-10-CM | POA: Diagnosis not present

## 2021-02-23 DIAGNOSIS — F259 Schizoaffective disorder, unspecified: Secondary | ICD-10-CM | POA: Insufficient documentation

## 2021-02-23 DIAGNOSIS — D72819 Decreased white blood cell count, unspecified: Secondary | ICD-10-CM | POA: Insufficient documentation

## 2021-02-23 DIAGNOSIS — D696 Thrombocytopenia, unspecified: Secondary | ICD-10-CM | POA: Diagnosis not present

## 2021-02-23 DIAGNOSIS — Q255 Atresia of pulmonary artery: Secondary | ICD-10-CM | POA: Diagnosis not present

## 2021-02-23 DIAGNOSIS — Q213 Tetralogy of Fallot: Secondary | ICD-10-CM | POA: Insufficient documentation

## 2021-02-23 DIAGNOSIS — G43909 Migraine, unspecified, not intractable, without status migrainosus: Secondary | ICD-10-CM | POA: Diagnosis not present

## 2021-02-23 DIAGNOSIS — Z803 Family history of malignant neoplasm of breast: Secondary | ICD-10-CM | POA: Insufficient documentation

## 2021-02-23 DIAGNOSIS — D72810 Lymphocytopenia: Secondary | ICD-10-CM

## 2021-02-23 LAB — CBC WITH DIFFERENTIAL (CANCER CENTER ONLY)
Abs Immature Granulocytes: 0.01 10*3/uL (ref 0.00–0.07)
Basophils Absolute: 0.1 10*3/uL (ref 0.0–0.1)
Basophils Relative: 2 %
Eosinophils Absolute: 0.1 10*3/uL (ref 0.0–0.5)
Eosinophils Relative: 3 %
HCT: 42.3 % (ref 36.0–46.0)
Hemoglobin: 14 g/dL (ref 12.0–15.0)
Immature Granulocytes: 0 %
Lymphocytes Relative: 36 %
Lymphs Abs: 1.1 10*3/uL (ref 0.7–4.0)
MCH: 30.2 pg (ref 26.0–34.0)
MCHC: 33.1 g/dL (ref 30.0–36.0)
MCV: 91.4 fL (ref 80.0–100.0)
Monocytes Absolute: 0.3 10*3/uL (ref 0.1–1.0)
Monocytes Relative: 10 %
Neutro Abs: 1.4 10*3/uL — ABNORMAL LOW (ref 1.7–7.7)
Neutrophils Relative %: 49 %
Platelet Count: 141 10*3/uL — ABNORMAL LOW (ref 150–400)
RBC: 4.63 MIL/uL (ref 3.87–5.11)
RDW: 12.3 % (ref 11.5–15.5)
WBC Count: 2.9 10*3/uL — ABNORMAL LOW (ref 4.0–10.5)
nRBC: 0 % (ref 0.0–0.2)

## 2021-02-23 LAB — VITAMIN B12: Vitamin B-12: 419 pg/mL (ref 180–914)

## 2021-02-23 LAB — SAVE SMEAR(SSMR), FOR PROVIDER SLIDE REVIEW

## 2021-02-24 DIAGNOSIS — F250 Schizoaffective disorder, bipolar type: Secondary | ICD-10-CM | POA: Diagnosis not present

## 2021-02-24 DIAGNOSIS — F4310 Post-traumatic stress disorder, unspecified: Secondary | ICD-10-CM | POA: Diagnosis not present

## 2021-02-24 LAB — ANA W/REFLEX: Anti Nuclear Antibody (ANA): NEGATIVE

## 2021-02-25 DIAGNOSIS — F259 Schizoaffective disorder, unspecified: Secondary | ICD-10-CM | POA: Diagnosis not present

## 2021-03-03 DIAGNOSIS — F4310 Post-traumatic stress disorder, unspecified: Secondary | ICD-10-CM | POA: Diagnosis not present

## 2021-03-03 DIAGNOSIS — F250 Schizoaffective disorder, bipolar type: Secondary | ICD-10-CM | POA: Diagnosis not present

## 2021-03-04 ENCOUNTER — Other Ambulatory Visit: Payer: Self-pay | Admitting: Medical

## 2021-03-10 DIAGNOSIS — F4310 Post-traumatic stress disorder, unspecified: Secondary | ICD-10-CM | POA: Diagnosis not present

## 2021-03-10 DIAGNOSIS — F250 Schizoaffective disorder, bipolar type: Secondary | ICD-10-CM | POA: Diagnosis not present

## 2021-03-12 DIAGNOSIS — I82413 Acute embolism and thrombosis of femoral vein, bilateral: Secondary | ICD-10-CM | POA: Diagnosis not present

## 2021-03-12 DIAGNOSIS — Z9889 Other specified postprocedural states: Secondary | ICD-10-CM | POA: Diagnosis not present

## 2021-03-12 DIAGNOSIS — Q256 Stenosis of pulmonary artery: Secondary | ICD-10-CM | POA: Diagnosis not present

## 2021-03-12 DIAGNOSIS — Q213 Tetralogy of Fallot: Secondary | ICD-10-CM | POA: Diagnosis not present

## 2021-03-12 DIAGNOSIS — F419 Anxiety disorder, unspecified: Secondary | ICD-10-CM | POA: Diagnosis not present

## 2021-03-12 DIAGNOSIS — Q220 Pulmonary valve atresia: Secondary | ICD-10-CM | POA: Diagnosis not present

## 2021-03-12 DIAGNOSIS — Q210 Ventricular septal defect: Secondary | ICD-10-CM | POA: Diagnosis not present

## 2021-03-17 DIAGNOSIS — F250 Schizoaffective disorder, bipolar type: Secondary | ICD-10-CM | POA: Diagnosis not present

## 2021-03-17 DIAGNOSIS — F4310 Post-traumatic stress disorder, unspecified: Secondary | ICD-10-CM | POA: Diagnosis not present

## 2021-03-24 DIAGNOSIS — F250 Schizoaffective disorder, bipolar type: Secondary | ICD-10-CM | POA: Diagnosis not present

## 2021-03-24 DIAGNOSIS — F4310 Post-traumatic stress disorder, unspecified: Secondary | ICD-10-CM | POA: Diagnosis not present

## 2021-03-31 DIAGNOSIS — F250 Schizoaffective disorder, bipolar type: Secondary | ICD-10-CM | POA: Diagnosis not present

## 2021-03-31 DIAGNOSIS — F4310 Post-traumatic stress disorder, unspecified: Secondary | ICD-10-CM | POA: Diagnosis not present

## 2021-03-31 DIAGNOSIS — F259 Schizoaffective disorder, unspecified: Secondary | ICD-10-CM | POA: Diagnosis not present

## 2021-04-07 DIAGNOSIS — F4310 Post-traumatic stress disorder, unspecified: Secondary | ICD-10-CM | POA: Diagnosis not present

## 2021-04-07 DIAGNOSIS — F250 Schizoaffective disorder, bipolar type: Secondary | ICD-10-CM | POA: Diagnosis not present

## 2021-04-14 DIAGNOSIS — F4310 Post-traumatic stress disorder, unspecified: Secondary | ICD-10-CM | POA: Diagnosis not present

## 2021-04-14 DIAGNOSIS — F250 Schizoaffective disorder, bipolar type: Secondary | ICD-10-CM | POA: Diagnosis not present

## 2021-04-16 DIAGNOSIS — F259 Schizoaffective disorder, unspecified: Secondary | ICD-10-CM | POA: Diagnosis not present

## 2021-04-21 DIAGNOSIS — F250 Schizoaffective disorder, bipolar type: Secondary | ICD-10-CM | POA: Diagnosis not present

## 2021-04-21 DIAGNOSIS — F4310 Post-traumatic stress disorder, unspecified: Secondary | ICD-10-CM | POA: Diagnosis not present

## 2021-04-28 DIAGNOSIS — M545 Low back pain, unspecified: Secondary | ICD-10-CM | POA: Diagnosis not present

## 2021-04-28 DIAGNOSIS — F4310 Post-traumatic stress disorder, unspecified: Secondary | ICD-10-CM | POA: Diagnosis not present

## 2021-04-28 DIAGNOSIS — S8002XA Contusion of left knee, initial encounter: Secondary | ICD-10-CM | POA: Diagnosis not present

## 2021-04-28 DIAGNOSIS — F250 Schizoaffective disorder, bipolar type: Secondary | ICD-10-CM | POA: Diagnosis not present

## 2021-04-30 DIAGNOSIS — F259 Schizoaffective disorder, unspecified: Secondary | ICD-10-CM | POA: Diagnosis not present

## 2021-05-01 DIAGNOSIS — Z6821 Body mass index (BMI) 21.0-21.9, adult: Secondary | ICD-10-CM | POA: Diagnosis not present

## 2021-05-01 DIAGNOSIS — Z01419 Encounter for gynecological examination (general) (routine) without abnormal findings: Secondary | ICD-10-CM | POA: Diagnosis not present

## 2021-05-05 DIAGNOSIS — F4310 Post-traumatic stress disorder, unspecified: Secondary | ICD-10-CM | POA: Diagnosis not present

## 2021-05-05 DIAGNOSIS — F250 Schizoaffective disorder, bipolar type: Secondary | ICD-10-CM | POA: Diagnosis not present

## 2021-05-08 ENCOUNTER — Telehealth: Payer: Self-pay | Admitting: Family Medicine

## 2021-05-08 NOTE — Telephone Encounter (Signed)
This pt called and is going to stop by and bring a form by from her job that shows she had a cpe this year. She said its only a check list and will need a provider signature. Will you be ok signing it? CPE was done 01/26/21 with Vickie

## 2021-05-12 DIAGNOSIS — F250 Schizoaffective disorder, bipolar type: Secondary | ICD-10-CM | POA: Diagnosis not present

## 2021-05-12 DIAGNOSIS — F4310 Post-traumatic stress disorder, unspecified: Secondary | ICD-10-CM | POA: Diagnosis not present

## 2021-05-12 NOTE — Telephone Encounter (Signed)
Patient notified- form faxed and up front to be scanned

## 2021-05-19 DIAGNOSIS — F4310 Post-traumatic stress disorder, unspecified: Secondary | ICD-10-CM | POA: Diagnosis not present

## 2021-05-19 DIAGNOSIS — F250 Schizoaffective disorder, bipolar type: Secondary | ICD-10-CM | POA: Diagnosis not present

## 2021-05-26 DIAGNOSIS — F250 Schizoaffective disorder, bipolar type: Secondary | ICD-10-CM | POA: Diagnosis not present

## 2021-05-26 DIAGNOSIS — F4310 Post-traumatic stress disorder, unspecified: Secondary | ICD-10-CM | POA: Diagnosis not present

## 2021-06-02 DIAGNOSIS — F4310 Post-traumatic stress disorder, unspecified: Secondary | ICD-10-CM | POA: Diagnosis not present

## 2021-06-02 DIAGNOSIS — F250 Schizoaffective disorder, bipolar type: Secondary | ICD-10-CM | POA: Diagnosis not present

## 2021-06-08 DIAGNOSIS — F259 Schizoaffective disorder, unspecified: Secondary | ICD-10-CM | POA: Diagnosis not present

## 2021-06-10 DIAGNOSIS — F250 Schizoaffective disorder, bipolar type: Secondary | ICD-10-CM | POA: Diagnosis not present

## 2021-06-10 DIAGNOSIS — F4310 Post-traumatic stress disorder, unspecified: Secondary | ICD-10-CM | POA: Diagnosis not present

## 2021-06-19 DIAGNOSIS — F250 Schizoaffective disorder, bipolar type: Secondary | ICD-10-CM | POA: Diagnosis not present

## 2021-06-19 DIAGNOSIS — Z6822 Body mass index (BMI) 22.0-22.9, adult: Secondary | ICD-10-CM | POA: Diagnosis not present

## 2021-06-23 DIAGNOSIS — F4310 Post-traumatic stress disorder, unspecified: Secondary | ICD-10-CM | POA: Diagnosis not present

## 2021-06-23 DIAGNOSIS — F250 Schizoaffective disorder, bipolar type: Secondary | ICD-10-CM | POA: Diagnosis not present

## 2021-06-30 DIAGNOSIS — F4310 Post-traumatic stress disorder, unspecified: Secondary | ICD-10-CM | POA: Diagnosis not present

## 2021-06-30 DIAGNOSIS — F250 Schizoaffective disorder, bipolar type: Secondary | ICD-10-CM | POA: Diagnosis not present

## 2021-07-07 DIAGNOSIS — F4310 Post-traumatic stress disorder, unspecified: Secondary | ICD-10-CM | POA: Diagnosis not present

## 2021-07-07 DIAGNOSIS — F250 Schizoaffective disorder, bipolar type: Secondary | ICD-10-CM | POA: Diagnosis not present

## 2021-07-10 DIAGNOSIS — F32A Depression, unspecified: Secondary | ICD-10-CM | POA: Diagnosis not present

## 2021-07-10 DIAGNOSIS — Z79899 Other long term (current) drug therapy: Secondary | ICD-10-CM | POA: Diagnosis not present

## 2021-07-10 DIAGNOSIS — F4310 Post-traumatic stress disorder, unspecified: Secondary | ICD-10-CM | POA: Diagnosis not present

## 2021-07-14 DIAGNOSIS — F250 Schizoaffective disorder, bipolar type: Secondary | ICD-10-CM | POA: Diagnosis not present

## 2021-07-14 DIAGNOSIS — F4310 Post-traumatic stress disorder, unspecified: Secondary | ICD-10-CM | POA: Diagnosis not present

## 2021-07-21 DIAGNOSIS — R079 Chest pain, unspecified: Secondary | ICD-10-CM | POA: Diagnosis not present

## 2021-07-21 DIAGNOSIS — J90 Pleural effusion, not elsewhere classified: Secondary | ICD-10-CM | POA: Diagnosis not present

## 2021-07-21 DIAGNOSIS — R9389 Abnormal findings on diagnostic imaging of other specified body structures: Secondary | ICD-10-CM | POA: Diagnosis not present

## 2021-07-21 DIAGNOSIS — F250 Schizoaffective disorder, bipolar type: Secondary | ICD-10-CM | POA: Diagnosis not present

## 2021-07-21 DIAGNOSIS — F4310 Post-traumatic stress disorder, unspecified: Secondary | ICD-10-CM | POA: Diagnosis not present

## 2021-07-28 DIAGNOSIS — F4310 Post-traumatic stress disorder, unspecified: Secondary | ICD-10-CM | POA: Diagnosis not present

## 2021-07-28 DIAGNOSIS — F250 Schizoaffective disorder, bipolar type: Secondary | ICD-10-CM | POA: Diagnosis not present

## 2021-07-30 DIAGNOSIS — R079 Chest pain, unspecified: Secondary | ICD-10-CM | POA: Diagnosis not present

## 2021-07-30 DIAGNOSIS — I288 Other diseases of pulmonary vessels: Secondary | ICD-10-CM | POA: Diagnosis not present

## 2021-07-30 DIAGNOSIS — I2510 Atherosclerotic heart disease of native coronary artery without angina pectoris: Secondary | ICD-10-CM | POA: Diagnosis not present

## 2021-07-30 DIAGNOSIS — R9389 Abnormal findings on diagnostic imaging of other specified body structures: Secondary | ICD-10-CM | POA: Diagnosis not present

## 2021-07-30 DIAGNOSIS — Q2547 Right aortic arch: Secondary | ICD-10-CM | POA: Diagnosis not present

## 2021-07-30 DIAGNOSIS — Q213 Tetralogy of Fallot: Secondary | ICD-10-CM | POA: Diagnosis not present

## 2021-07-30 DIAGNOSIS — Z9889 Other specified postprocedural states: Secondary | ICD-10-CM | POA: Diagnosis not present

## 2021-07-30 DIAGNOSIS — F32A Depression, unspecified: Secondary | ICD-10-CM | POA: Diagnosis not present

## 2021-07-30 DIAGNOSIS — F4310 Post-traumatic stress disorder, unspecified: Secondary | ICD-10-CM | POA: Diagnosis not present

## 2021-08-04 DIAGNOSIS — F250 Schizoaffective disorder, bipolar type: Secondary | ICD-10-CM | POA: Diagnosis not present

## 2021-08-04 DIAGNOSIS — F4310 Post-traumatic stress disorder, unspecified: Secondary | ICD-10-CM | POA: Diagnosis not present

## 2021-08-11 DIAGNOSIS — F250 Schizoaffective disorder, bipolar type: Secondary | ICD-10-CM | POA: Diagnosis not present

## 2021-08-11 DIAGNOSIS — F4310 Post-traumatic stress disorder, unspecified: Secondary | ICD-10-CM | POA: Diagnosis not present

## 2021-08-18 DIAGNOSIS — F4310 Post-traumatic stress disorder, unspecified: Secondary | ICD-10-CM | POA: Diagnosis not present

## 2021-08-18 DIAGNOSIS — F250 Schizoaffective disorder, bipolar type: Secondary | ICD-10-CM | POA: Diagnosis not present

## 2021-08-25 DIAGNOSIS — F250 Schizoaffective disorder, bipolar type: Secondary | ICD-10-CM | POA: Diagnosis not present

## 2021-08-25 DIAGNOSIS — F4310 Post-traumatic stress disorder, unspecified: Secondary | ICD-10-CM | POA: Diagnosis not present

## 2021-08-28 DIAGNOSIS — J984 Other disorders of lung: Secondary | ICD-10-CM | POA: Insufficient documentation

## 2021-08-28 DIAGNOSIS — Q210 Ventricular septal defect: Secondary | ICD-10-CM | POA: Diagnosis not present

## 2021-08-28 DIAGNOSIS — R918 Other nonspecific abnormal finding of lung field: Secondary | ICD-10-CM | POA: Diagnosis not present

## 2021-08-28 DIAGNOSIS — Q213 Tetralogy of Fallot: Secondary | ICD-10-CM | POA: Diagnosis not present

## 2021-09-01 DIAGNOSIS — F250 Schizoaffective disorder, bipolar type: Secondary | ICD-10-CM | POA: Diagnosis not present

## 2021-09-01 DIAGNOSIS — F4310 Post-traumatic stress disorder, unspecified: Secondary | ICD-10-CM | POA: Diagnosis not present

## 2021-09-10 DIAGNOSIS — F4310 Post-traumatic stress disorder, unspecified: Secondary | ICD-10-CM | POA: Diagnosis not present

## 2021-09-10 DIAGNOSIS — Z6821 Body mass index (BMI) 21.0-21.9, adult: Secondary | ICD-10-CM | POA: Diagnosis not present

## 2021-09-15 DIAGNOSIS — F250 Schizoaffective disorder, bipolar type: Secondary | ICD-10-CM | POA: Diagnosis not present

## 2021-09-15 DIAGNOSIS — F4310 Post-traumatic stress disorder, unspecified: Secondary | ICD-10-CM | POA: Diagnosis not present

## 2021-09-22 DIAGNOSIS — F250 Schizoaffective disorder, bipolar type: Secondary | ICD-10-CM | POA: Diagnosis not present

## 2021-09-22 DIAGNOSIS — F4310 Post-traumatic stress disorder, unspecified: Secondary | ICD-10-CM | POA: Diagnosis not present

## 2021-10-02 DIAGNOSIS — G4734 Idiopathic sleep related nonobstructive alveolar hypoventilation: Secondary | ICD-10-CM | POA: Insufficient documentation

## 2021-10-06 ENCOUNTER — Encounter: Payer: Self-pay | Admitting: Internal Medicine

## 2021-10-06 ENCOUNTER — Encounter: Payer: Self-pay | Admitting: Physician Assistant

## 2021-10-06 ENCOUNTER — Ambulatory Visit: Payer: Federal, State, Local not specified - PPO | Admitting: Physician Assistant

## 2021-10-06 VITALS — BP 120/70 | HR 75 | Ht 65.0 in | Wt 122.2 lb

## 2021-10-06 DIAGNOSIS — R112 Nausea with vomiting, unspecified: Secondary | ICD-10-CM

## 2021-10-06 DIAGNOSIS — E559 Vitamin D deficiency, unspecified: Secondary | ICD-10-CM | POA: Diagnosis not present

## 2021-10-06 DIAGNOSIS — G43701 Chronic migraine without aura, not intractable, with status migrainosus: Secondary | ICD-10-CM | POA: Diagnosis not present

## 2021-10-06 DIAGNOSIS — J984 Other disorders of lung: Secondary | ICD-10-CM | POA: Diagnosis not present

## 2021-10-06 MED ORDER — VITAMIN D (ERGOCALCIFEROL) 1.25 MG (50000 UNIT) PO CAPS: 50000.0000 [IU] | ORAL_CAPSULE | ORAL | 3 refills | Status: DC

## 2021-10-06 MED ORDER — PROMETHAZINE HCL 25 MG PO TABS: 25.0000 mg | ORAL_TABLET | Freq: Three times a day (TID) | ORAL | 0 refills | Status: DC | PRN

## 2021-10-06 NOTE — Assessment & Plan Note (Signed)
Chronic, phenergan 25 mg qid prn, referral to GI

## 2021-10-06 NOTE — Assessment & Plan Note (Signed)
Status unknown, will check vitamin D lab today and refill vitamin d supplement to take weekly

## 2021-10-06 NOTE — Assessment & Plan Note (Signed)
Stable, If symptoms get worse, please call the office for a follow up appointment if available, otherwise go to Urgent Care, call 911 / EMS, or go to the Emergency Department for further evaluation, referral back to Neurology

## 2021-10-06 NOTE — Progress Notes (Signed)
Established Patient Office Visit  Subjective:  Patient ID: Alexis [name], female    DOB: 1996-04-15  Age: 26 y.o. MRN: [id number]  CC:  Chief Complaint  Patient presents with   Acute Visit    She has been having migraines so bad to where she almost blacks out. Her vision gets blurry and today she cant keep anything down.    HPI Alexis [name] presents for migraine headaches without an aura that are getting worse; has had a headache for the past 2 days and took tylenol 500 mg x 2 yesterday, but vomited it back up; today states her vision got foggy for a minute from the headache; states she can't tolerate zofran and several other migraine headache medicines; has been to a Neurologist in the past but doesn't have a follow up appointment scheduled; also states that she has nausea and vomiting with and without headaches; had a negative upper endoscopy, and hasn't been back to the GI Dr in a while   Outpatient Medications Prior to Visit  Medication Sig Dispense Refill   JUNEL 1/20 1-20 MG-MCG tablet Take 1 tablet by mouth daily.     lurasidone (LATUDA) 40 MG TABS tablet Take 1 tablet by mouth daily.     venlafaxine (EFFEXOR) 37.5 MG tablet Take by mouth.     busPIRone (BUSPAR) 10 MG tablet Take 10 mg by mouth 2 (two) times daily. (Patient not taking: Reported on 10/06/2021)     EPINEPHrine 0.3 mg/0.3 mL IJ SOAJ injection Inject 0.3 mLs (0.3 mg total) into the muscle as needed for anaphylaxis. (Patient not taking: Reported on 10/06/2021) 1 each 1   Erenumab-aooe (AIMOVIG) 70 MG/ML SOAJ Inject 70 mg into the skin every 30 (thirty) days. (Patient not taking: Reported on 02/23/2021) 1 mL 11   ibuprofen (ADVIL) 200 MG tablet Take 600 mg by mouth every 6 (six) hours as needed for headache or moderate pain.     lamoTRIgine (LAMICTAL) 200 MG tablet Take 200 mg by mouth daily.     LO LOESTRIN FE 1 MG-10 MCG / 10 MCG tablet Take 1 tablet by mouth daily. (Patient not taking: Reported on 02/23/2021)      omeprazole (PRILOSEC) 40 MG capsule TAKE 1 CAPSULE (40 MG TOTAL) BY MOUTH DAILY. (Patient not taking: Reported on 10/06/2021) 90 capsule 1   ondansetron (ZOFRAN ODT) 4 MG disintegrating tablet Take 1 tablet (4 mg total) by mouth every 8 (eight) hours as needed. 20 tablet 6   promethazine (PHENERGAN) 25 MG tablet Take 1 tablet (25 mg total) by mouth every 8 (eight) hours as needed for nausea or vomiting. (Patient not taking: Reported on 10/06/2021) 10 tablet 0   QUEtiapine (SEROQUEL) 100 MG tablet Take 100 mg by mouth at bedtime. (Patient not taking: Reported on 10/06/2021)     SUMAtriptan (IMITREX) 100 MG tablet Take 1 tablet (100 mg total) by mouth once as needed for up to 1 dose for migraine. May repeat in 2 hours if headache persists or recurs. (Patient not taking: Reported on 02/23/2021) 12 tablet 6   Vitamin D, Ergocalciferol, (DRISDOL) 1.25 MG (50000 UNIT) CAPS capsule Take 1 capsule (50,000 Units total) by mouth every 7 (seven) days. (Patient not taking: Reported on 10/06/2021) 12 capsule 1   No facility-administered medications prior to visit.    Allergies  Allergen Reactions   Dopamine     Makes WBC rise cardiac arrest   Peanut-Containing Drug Products     Hazel nuts [place] nuts  Patient Care Team: Lexine Baton as PCP - General (Physician Assistant) Rosiland Oz, MD as Consulting Physician (Pediatrics)  ROS Review of Systems  Constitutional:  Negative for activity change and chills.  HENT:  Negative for congestion and voice change.   Eyes:  Positive for photophobia and visual disturbance. Negative for pain and redness.  Respiratory:  Negative for cough and wheezing.   Cardiovascular:  Negative for chest pain.  Gastrointestinal:  Positive for nausea and vomiting. Negative for constipation and diarrhea.  Endocrine: Negative for polyuria.  Genitourinary:  Negative for frequency.  Skin:  Negative for color change and rash.  Allergic/Immunologic: Negative for  immunocompromised state.  Neurological:  Positive for headaches. Negative for dizziness.  Psychiatric/Behavioral:  Negative for agitation.      Objective:    Physical Exam Vitals and nursing note reviewed.  Constitutional:      General: She is not in acute distress.    Appearance: She is normal weight. She is not ill-appearing.  HENT:     Head: Normocephalic and atraumatic.     Right Ear: External ear normal.     Left Ear: External ear normal.  Eyes:     Extraocular Movements: Extraocular movements intact.     Conjunctiva/sclera: Conjunctivae normal.     Pupils: Pupils are equal, round, and reactive to light.  Cardiovascular:     Rate and Rhythm: Normal rate and regular rhythm.  Pulmonary:     Effort: Pulmonary effort is normal.     Breath sounds: Normal breath sounds. No wheezing.  Abdominal:     General: Bowel sounds are normal.     Palpations: Abdomen is soft.  Musculoskeletal:        General: Normal range of motion.     Cervical back: Normal range of motion.  Skin:    General: Skin is warm and dry.  Neurological:     Mental Status: She is alert and oriented to person, place, and time.  Psychiatric:        Mood and Affect: Mood normal.    BP 120/70   Pulse 75   Ht 5\' 5"  (1.651 m)   Wt 122 lb 3.2 oz (55.4 kg)   SpO2 99%   BMI 20.34 kg/m   Wt Readings from Last 3 Encounters:  10/06/21 122 lb 3.2 oz (55.4 kg)  02/23/21 131 lb 12.8 oz (59.8 kg)  01/26/21 131 lb 9.6 oz (59.7 kg)    Results for orders placed or performed in visit on 02/23/21  Save Smear (SSMR)  Result Value Ref Range   Smear Review SMEAR STAINED AND AVAILABLE FOR REVIEW   CBC with Differential (Cancer Center Only)  Result Value Ref Range   WBC Count 2.9 (L) 4.0 - 10.5 K/uL   RBC 4.63 3.87 - 5.11 MIL/uL   Hemoglobin 14.0 12.0 - 15.0 g/dL   HCT 04/25/21 40.3 - 47.4 %   MCV 91.4 80.0 - 100.0 fL   MCH 30.2 26.0 - 34.0 pg   MCHC 33.1 30.0 - 36.0 g/dL   RDW 25.9 56.3 - 87.5 %   Platelet Count 141  (L) 150 - 400 K/uL   nRBC 0.0 0.0 - 0.2 %   Neutrophils Relative % 49 %   Neutro Abs 1.4 (L) 1.7 - 7.7 K/uL   Lymphocytes Relative 36 %   Lymphs Abs 1.1 0.7 - 4.0 K/uL   Monocytes Relative 10 %   Monocytes Absolute 0.3 0.1 - 1.0 K/uL   Eosinophils Relative 3 %  Eosinophils Absolute 0.1 0.0 - 0.5 K/uL   Basophils Relative 2 %   Basophils Absolute 0.1 0.0 - 0.1 K/uL   Immature Granulocytes 0 %   Abs Immature Granulocytes 0.01 0.00 - 0.07 K/uL  Vitamin B12  Result Value Ref Range   Vitamin B-12 419 180 - 914 pg/mL  ANA w/Reflex  Result Value Ref Range   Anti Nuclear Antibody (ANA) Negative Negative       The ASCVD Risk score (Arnett DK, et al., 2019) failed to calculate for the following reasons:   The 2019 ASCVD risk score is only valid for ages 2440 to 4779    Assessment & Plan:   Problem List Items Addressed This Visit       Cardiovascular and Mediastinum   Chronic migraine without aura - Primary    Stable, If symptoms get worse, please call the office for a follow up appointment if available, otherwise go to Urgent Care, call 911 / EMS, or go to the Emergency Department for further evaluation, referral back to Neurology       Relevant Medications   venlafaxine (EFFEXOR) 37.5 MG tablet   Other Relevant Orders   Ambulatory referral to Neurology   CBC with Differential/Platelet   Comprehensive metabolic panel     Digestive   Nausea and vomiting    Chronic, phenergan 25 mg qid prn, referral to GI       Relevant Medications   promethazine (PHENERGAN) 25 MG tablet   Other Relevant Orders   Ambulatory referral to Gastroenterology   CBC with Differential/Platelet   Comprehensive metabolic panel     Other   Vitamin D deficiency    Status unknown, will check vitamin D lab today and refill vitamin d supplement to take weekly       Relevant Orders   VITAMIN D 25 Hydroxy (Vit-D Deficiency, Fractures)    Meds ordered this encounter  Medications   promethazine  (PHENERGAN) 25 MG tablet    Sig: Take 1 tablet (25 mg total) by mouth every 8 (eight) hours as needed for nausea or vomiting. May cause drowsiness    Dispense:  30 tablet    Refill:  0    Order Specific Question:   Supervising Provider    Answer:   Ronnald NianLALONDE, JOHN C [[id number]]   Vitamin D, Ergocalciferol, (DRISDOL) 1.25 MG (50000 UNIT) CAPS capsule    Sig: Take 1 capsule (50,000 Units total) by mouth every 7 (seven) days.    Dispense:  12 capsule    Refill:  3    Order Specific Question:   Supervising Provider    Answer:   Ronnald NianLALONDE, JOHN C [[id number]]    Follow-up: Return for Return as Already Scheduled.    Alexis SharkLynne B Arvine Clayburn, PA-C

## 2021-10-07 DIAGNOSIS — F4310 Post-traumatic stress disorder, unspecified: Secondary | ICD-10-CM | POA: Diagnosis not present

## 2021-10-07 DIAGNOSIS — F250 Schizoaffective disorder, bipolar type: Secondary | ICD-10-CM | POA: Diagnosis not present

## 2021-10-07 LAB — COMPREHENSIVE METABOLIC PANEL
ALT: 14 IU/L (ref 0–32)
AST: 19 IU/L (ref 0–40)
Albumin/Globulin Ratio: 1.8 (ref 1.2–2.2)
Albumin: 4.7 g/dL (ref 3.9–5.0)
Alkaline Phosphatase: 63 IU/L (ref 44–121)
BUN/Creatinine Ratio: 9 (ref 9–23)
BUN: 8 mg/dL (ref 6–20)
Bilirubin Total: 0.6 mg/dL (ref 0.0–1.2)
CO2: 19 mmol/L — ABNORMAL LOW (ref 20–29)
Calcium: 9.9 mg/dL (ref 8.7–10.2)
Chloride: 102 mmol/L (ref 96–106)
Creatinine, Ser: 0.86 mg/dL (ref 0.57–1.00)
Globulin, Total: 2.6 g/dL (ref 1.5–4.5)
Glucose: 95 mg/dL (ref 70–99)
Potassium: 4.4 mmol/L (ref 3.5–5.2)
Sodium: 138 mmol/L (ref 134–144)
Total Protein: 7.3 g/dL (ref 6.0–8.5)
eGFR: 97 mL/min/{1.73_m2} (ref >59)

## 2021-10-07 LAB — CBC WITH DIFFERENTIAL/PLATELET
Basophils Absolute: 0 10*3/uL (ref 0.0–0.2)
Basos: 1 %
EOS (ABSOLUTE): 0 10*3/uL (ref 0.0–0.4)
Eos: 1 %
Hematocrit: 43.9 % (ref 34.0–46.6)
Hemoglobin: 14.9 g/dL (ref 11.1–15.9)
Immature Grans (Abs): 0 10*3/uL (ref 0.0–0.1)
Immature Granulocytes: 0 %
Lymphocytes Absolute: 0.7 10*3/uL (ref 0.7–3.1)
Lymphs: 18 %
MCH: 30.2 pg (ref 26.6–33.0)
MCHC: 33.9 g/dL (ref 31.5–35.7)
MCV: 89 fL (ref 79–97)
Monocytes Absolute: 0.4 10*3/uL (ref 0.1–0.9)
Monocytes: 11 %
Neutrophils Absolute: 2.6 10*3/uL (ref 1.4–7.0)
Neutrophils: 69 %
Platelets: 122 10*3/uL — ABNORMAL LOW (ref 150–450)
RBC: 4.94 x10E6/uL (ref 3.77–5.28)
RDW: 13.3 % (ref 11.7–15.4)
WBC: 3.7 10*3/uL (ref 3.4–10.8)

## 2021-10-07 LAB — VITAMIN D 25 HYDROXY (VIT D DEFICIENCY, FRACTURES): Vit D, 25-Hydroxy: 8.8 ng/mL — ABNORMAL LOW (ref 30.0–100.0)

## 2021-10-08 DIAGNOSIS — F4310 Post-traumatic stress disorder, unspecified: Secondary | ICD-10-CM | POA: Diagnosis not present

## 2021-10-13 DIAGNOSIS — G4734 Idiopathic sleep related nonobstructive alveolar hypoventilation: Secondary | ICD-10-CM | POA: Diagnosis not present

## 2021-10-13 DIAGNOSIS — J984 Other disorders of lung: Secondary | ICD-10-CM | POA: Diagnosis not present

## 2021-10-14 DIAGNOSIS — F250 Schizoaffective disorder, bipolar type: Secondary | ICD-10-CM | POA: Diagnosis not present

## 2021-10-14 DIAGNOSIS — F4310 Post-traumatic stress disorder, unspecified: Secondary | ICD-10-CM | POA: Diagnosis not present

## 2021-10-21 DIAGNOSIS — F4310 Post-traumatic stress disorder, unspecified: Secondary | ICD-10-CM | POA: Diagnosis not present

## 2021-10-21 DIAGNOSIS — F250 Schizoaffective disorder, bipolar type: Secondary | ICD-10-CM | POA: Diagnosis not present

## 2021-10-28 ENCOUNTER — Other Ambulatory Visit (INDEPENDENT_AMBULATORY_CARE_PROVIDER_SITE_OTHER): Payer: Federal, State, Local not specified - PPO

## 2021-10-28 ENCOUNTER — Ambulatory Visit: Payer: Federal, State, Local not specified - PPO | Admitting: Internal Medicine

## 2021-10-28 ENCOUNTER — Encounter: Payer: Self-pay | Admitting: Internal Medicine

## 2021-10-28 VITALS — BP 100/70 | HR 70 | Ht 65.0 in | Wt 124.0 lb

## 2021-10-28 DIAGNOSIS — R112 Nausea with vomiting, unspecified: Secondary | ICD-10-CM | POA: Diagnosis not present

## 2021-10-28 DIAGNOSIS — Z8719 Personal history of other diseases of the digestive system: Secondary | ICD-10-CM

## 2021-10-28 DIAGNOSIS — R109 Unspecified abdominal pain: Secondary | ICD-10-CM

## 2021-10-28 DIAGNOSIS — K5900 Constipation, unspecified: Secondary | ICD-10-CM

## 2021-10-28 LAB — C-REACTIVE PROTEIN: CRP: <1 mg/dL (ref 0.5–20.0)

## 2021-10-28 NOTE — Patient Instructions (Addendum)
If you are age 25 or older, your body mass index should be between 23-30. Your Body mass index is 20.63 kg/m. If this is out of the aforementioned range listed, please consider follow up with your Primary Care Provider.  If you are age 36 or younger, your body mass index should be between 19-25. Your Body mass index is 20.63 kg/m. If this is out of the aformentioned range listed, please consider follow up with your Primary Care Provider.   Your provider has requested that you go to the basement level for lab work before leaving today. Press "B" on the elevator. The lab is located at the first door on the left as you exit the elevator.   Please follow [employer] diet provided.   We have sent the following medications to your pharmacy for you to pick up at your convenience: Omeprazole 40 mg once daily.   Drink 8 cups of water a day and walk 30 minutes a day.  Please purchase the following medications over the counter and take as directed: Fiber supplement such as Benefiber- use as directed daily Miralax: Take as  directed up to 3 times a day to achieve regular bowel movements   The [facility] providers would like to encourage you to use [facility] to communicate with providers for non-urgent requests or questions.  Due to long hold times on the telephone, sending your provider a message by [facility] may be a faster and more efficient way to get a response.  Please allow 48 business hours for a response.  Please remember that this is for non-urgent requests.   Thank you for entrusting me with your care and for choosing [facility], Dr. Eulah Pont

## 2021-10-28 NOTE — Progress Notes (Addendum)
Chief Complaint: N&V  HPI : 26 year old female with history of migraines, bipolar disorder, and schizoaffective disorder presents with N&V.   She has been dealing with N&V for the last several years, which has worsened over the last 3 months. She has had to call out from her job due to vomiting. She cannot identify any particular triggers for the N&V. She has seen Dr. Elnoria Howard in the past for the same issue of nausea, and she was found to have some gastritis on EGD. Endorses constipation. She used to have regular BMs and now she is lucky if she has a BM twice a week. Endorses abdominal pain. The pain is located in the middle of her abdomen. She does have issues with migraines and will see a neurologist in 11/2021 for further options for treatment. Denies blood in the vomit. Endorses blood in the stool on occasion. Zofran does not help with her N&V. Phenergan helps but makes her sleepy. Never tried PPI. Denies NSAIDs. Denies dysphagia, chest burning, or regurgitation. She has lost about 4 lbs over the last 3 weeks. She is not sure if migraines are related to her N&V. Endorses vertigo maybe 4 times per week, which is connected to her migraines. She does have menstrual cycles due to her birth control. Endorses marijuana use to help with her schizoaffective disorder. She did do trial off of the marijuana for 3 years and it did not help with N&V. She just started smoking marijuana again a few months ago. Endorses alcohol use once every two months.   She works as a Data processing manager.  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   Schizoaffective disorder (HCC)    Tetralogy of Fallot     Past Surgical History:  Procedure Laterality Date   BIOPSY  03/08/2019   Procedure: BIOPSY;  Surgeon: Jeani Hawking, MD;  Location: [facility];  Service: Endoscopy;;   CARDIAC SURGERY     CARDIAC SURGERY     4 open heart surgeries   ESOPHAGOGASTRODUODENOSCOPY (EGD) WITH PROPOFOL N/A 03/08/2019   Procedure: ESOPHAGOGASTRODUODENOSCOPY (EGD) WITH PROPOFOL;  Surgeon: Jeani Hawking, MD;  Location: [facility];  Service: Endoscopy;  Laterality: N/A;   GASTROSTOMY W/ FEEDING TUBE     removed 1 year ago    THORACIC DUCT LIGATION     Family History  Problem Relation Age of Onset   Hypertension Mother    Healthy Father    Hypertension Maternal Grandmother    Diabetes Maternal Grandfather    Heart disease Maternal Grandfather    Stroke Maternal Grandfather    Kidney disease Paternal Grandfather    Heart attack Other    Stroke Other    Colon cancer Neg Hx    Social History   Tobacco Use   Smoking status: Never   Smokeless tobacco: Never  Vaping Use   Vaping Use: Never used  Substance Use Topics   Alcohol use: Yes    Comment:  occas   Drug use: Yes    Types: Marijuana    Comment: Occas.  Hemp   Current Outpatient Medications  Medication Sig Dispense Refill   albuterol (VENTOLIN HFA) 108 (90 Base) MCG/ACT inhaler Inhale 2 puffs into the lungs every 6 (six) hours as needed.     JUNEL 1/20 1-20 MG-MCG tablet Take 1 tablet by mouth daily.     lurasidone (LATUDA) 40 MG TABS tablet Take 1 tablet by mouth daily.     promethazine (PHENERGAN) 25 MG tablet Take 1 tablet (25 mg total) by mouth every 8 (eight) hours as needed for nausea or vomiting. May cause drowsiness 30 tablet 0   venlafaxine XR (EFFEXOR-XR) 75 MG 24 hr capsule Take 1 capsule by mouth daily.     Vitamin D, Ergocalciferol, (DRISDOL) 1.25 MG (50000 UNIT) CAPS capsule Take 1 capsule (50,000 Units total) by mouth every 7  (seven) days. 12 capsule 3   No current facility-administered medications for this visit.   Allergies  Allergen Reactions   Dopamine     Makes WBC rise cardiac arrest   Peanut-Containing Drug Products     Hazel nuts [place] nuts     Review of Systems: All systems reviewed and negative except where noted in HPI.   Physical Exam: BP 100/70   Pulse 70   Ht 5\' 5"  (1.651 m)   Wt 124 lb (56.2 kg)   SpO2 98%   BMI 20.63 kg/m  Constitutional: Pleasant,well-developed, female in no acute distress. HEENT: Normocephalic and atraumatic. Conjunctivae are normal. No scleral icterus. Cardiovascular: Normal rate, systolic ejection murmur.  Pulmonary/chest: Effort normal and breath sounds normal. No wheezing, rales or rhonchi. Abdominal: Soft, nondistended, tender in the suprapubic area. Bowel sounds active throughout. There are no masses palpable. No hepatomegaly. Extremities: No edema Neurological: Alert and oriented to person place and time. Skin: Skin is warm and dry. No rashes noted. Psychiatric: Normal mood and affect. Behavior is normal.  Labs 09/2021: CBC with low plts of 122. CMP unremarkable. Vit D low.  CT A/P w/contrast 02/20/19: IMPRESSION: 3.7 cm right ovarian cyst. No other acute intra-abdominal or pelvic pathology.  EGD 03/08/19: - Normal esophagus. - Post-surgical deformity in the gastric antrum. - Normal mucosa was found in the entire stomach. Biopsied. - Normal examined duodenum. Path: A. STOMACH, RANDOM, BIOPSY:  - Chronic inactive gastritis.  - There is no evidence of Helicobacter pylori, dysplasia, or malignancy.  - See comment.  COMMENT:  A Warthin-Starry stain is negative for the presence of Helicobacter  pylori organisms  ASSESSMENT AND PLAN: N&V Constipation Lower midline abdominal pain Weight loss History of gastritis Patient has been dealing with longstanding nausea and vomiting for years.  Unclear etiology.  Potential contributors could include  migraines, vertigo, constipation, GERD, gastritis, gallstones. Will plan to start off with an initial evaluation with labs and RUQ U/S.  In the meantime will start her on empiric therapy for GERD and constipation.  - Low FODMAP diet - Check TTG IgA, IgA, CRP - Start 8 cups of water, walking 30 minutes, and taking daily fiber supplement - Start daily Miralax. Titrate to achieve  - Start omeprazole 40 mg QD - RUQ U/S - Patient is scheduled to see neurology for migraines soon - RTC 6 weeks. If symptoms are not improved, then consider EGD/colonoscopy at that time. Could also consider gastric emptying study in the future  03/10/19, MD

## 2021-10-29 LAB — IGA: Immunoglobulin A: 70 mg/dL (ref 47–310)

## 2021-10-29 LAB — TISSUE TRANSGLUTAMINASE, IGA: (tTG) Ab, IgA: <1 U/mL

## 2021-11-04 DIAGNOSIS — F4310 Post-traumatic stress disorder, unspecified: Secondary | ICD-10-CM | POA: Diagnosis not present

## 2021-11-04 DIAGNOSIS — F250 Schizoaffective disorder, bipolar type: Secondary | ICD-10-CM | POA: Diagnosis not present

## 2021-11-13 ENCOUNTER — Encounter: Payer: Self-pay | Admitting: Internal Medicine

## 2021-11-18 DIAGNOSIS — F250 Schizoaffective disorder, bipolar type: Secondary | ICD-10-CM | POA: Diagnosis not present

## 2021-11-18 DIAGNOSIS — F4310 Post-traumatic stress disorder, unspecified: Secondary | ICD-10-CM | POA: Diagnosis not present

## 2021-11-19 DIAGNOSIS — F4310 Post-traumatic stress disorder, unspecified: Secondary | ICD-10-CM | POA: Diagnosis not present

## 2021-11-25 DIAGNOSIS — F4310 Post-traumatic stress disorder, unspecified: Secondary | ICD-10-CM | POA: Diagnosis not present

## 2021-11-25 DIAGNOSIS — F250 Schizoaffective disorder, bipolar type: Secondary | ICD-10-CM | POA: Diagnosis not present

## 2021-12-02 DIAGNOSIS — F250 Schizoaffective disorder, bipolar type: Secondary | ICD-10-CM | POA: Diagnosis not present

## 2021-12-02 DIAGNOSIS — F4310 Post-traumatic stress disorder, unspecified: Secondary | ICD-10-CM | POA: Diagnosis not present

## 2021-12-03 ENCOUNTER — Encounter: Payer: Self-pay | Admitting: Neurology

## 2021-12-03 ENCOUNTER — Telehealth: Payer: Self-pay | Admitting: Neurology

## 2021-12-03 ENCOUNTER — Ambulatory Visit: Payer: Federal, State, Local not specified - PPO | Admitting: Neurology

## 2021-12-03 VITALS — BP 106/75 | HR 101 | Ht 65.0 in | Wt 127.0 lb

## 2021-12-03 DIAGNOSIS — G43709 Chronic migraine without aura, not intractable, without status migrainosus: Secondary | ICD-10-CM | POA: Diagnosis not present

## 2021-12-03 DIAGNOSIS — F319 Bipolar disorder, unspecified: Secondary | ICD-10-CM

## 2021-12-03 MED ORDER — NURTEC 75 MG PO TBDP
75.0000 mg | ORAL_TABLET | ORAL | 11 refills | Status: DC | PRN
Start: 2021-12-03 — End: 2022-04-10

## 2021-12-03 MED ORDER — EMGALITY 120 MG/ML ~~LOC~~ SOAJ
120.0000 mg | SUBCUTANEOUS | 11 refills | Status: DC
Start: 2021-12-03 — End: 2022-01-07

## 2021-12-03 MED ORDER — KETOROLAC TROMETHAMINE 30 MG/ML IJ SOLN
30.0000 mg | Freq: Once | INTRAMUSCULAR | Status: AC
  Administered 2021-12-03: 30 mg via INTRAMUSCULAR

## 2021-12-03 MED ORDER — EMGALITY 120 MG/ML ~~LOC~~ SOAJ
240.0000 mg | Freq: Once | SUBCUTANEOUS | 0 refills | Status: AC
Start: 2021-12-03 — End: 2021-12-03

## 2021-12-03 NOTE — Telephone Encounter (Signed)
[employer] fed NPR sent to GI  

## 2021-12-03 NOTE — Progress Notes (Signed)
Orders given from NP for Ketorolac 30 mg Pt verified by name and DOB Explain procedure to pt, ketorolac 30 mg given in the RUOQ. Pt advised to wait 15 min's after injection  Pt tolerated well

## 2021-12-03 NOTE — Patient Instructions (Signed)
Start the [employer] for migraine prevention, the 1st month you will do 2 injections as loading dose, followed 30 days later bu 1 injection, try Nurtec 1 tablet at onset of headache, max is 1 tablet in 24 hours, check MRI of the brain

## 2021-12-03 NOTE — Addendum Note (Signed)
Addended by: Rosezella [place] on: 12/03/2021 05:01 PM   Modules accepted: Orders

## 2021-12-03 NOTE — Progress Notes (Signed)
Patient: Alexis [name] Date of Birth: Dec 26, 1995  Reason for Visit: Follow up History from: Patient Primary Neurologist: Dr.Yan   ASSESSMENT AND PLAN 26 y.o. year old female   1.  Chronic migraine headache 2.  Schizoaffective disorder 3.  History of Tetralogy of Fallot, multiple open heart surgery in the past  -Start Emgality with loading dose for the first month followed by maintenance dose for migraine preventative -Try Nurtec 75 mg at onset of headache -Check MRI of the brain with and without contrast due to reported vision loss with recent migraine x 2 -Previously tried and failed Lamictal, Inderal, Aimovig, Vraylar, Seroquel, Risperdal, Geodon, Latuda, lithium, Zyprexa, Saphris, Imitrex, Maxalt, Depakote -Given 30 mg Toradol IM injection today  -Follow-up in 4 months or sooner if needed  HISTORY  Alexis [name] is a 26 year old female, seen in request by her primary care nurse practitioner Hetty Blend for evaluation of chronic migraine headache.   I reviewed and summarized the referring note.  Past medical history Tetralogy of Fallot, status post multiple heart surgery in the past Schizoaffective disorder, admission in September 2021 for worsening depression,   I saw her previously in 2017 for chronic migraine headache, she reported a history of migraine headaches since middle school, her typical migraine a lateralized severe pounding headache with associated light noise sensitivity, lasting for hours to days, she could not function during migraine, she was put on Depakote as preventive medication, reported significant improvement of her headache,   She currently works as a Metallurgist, reported increased migraine headaches since 2021, at least once a week, severe migraine headache, with nausea, lasting for 1 day, sometimes relieved by over-the-counter Advil, but oftentimes is not as effective,   I personally reviewed MRI of the brain in 2017, there was no  significant abnormality   Laboratory evaluations in September 2021: Normal TSH, lipid panel, A1c 5.8, CMP, CBC hemoglobin 14.9   Hospital admission for worsening schizoaffective versus bipolar and posttraumatic stress disorder, currently she is treated with lamotrigine 125 mg daily, she reported suboptimal control of her mood disorder, still feel depressed   She denied lateralized motor or sensory deficit,   UPDATE Sep 24 2020: She did not get her aimovig as ordered, tried Maxalt few times, did not have significant side effect, but did not help her headache, she continues to have frequent headaches, she had a headache for 1 week, taking ibuprofen 1000 mg daily, total of Maxalt 3 times without helping her headache, pain in complaints retro-orbital area, headache with light noise sensitivity, 9/10, she received IV Depacon, Toradol 30 mg, reported trending down of her headache, tolerating it well   She also complains of worsening depression, higher dose of lamotrigine 200 mg daily, stop Risperdal because of frequent eye blinking concerning tardive dyskinesia, now on Seroquel 75 mg every night,   Update December 03, 2021 SS: Patient seen for Dr. Terrace Arabia, here today to discuss increase in migraine headache, reports a daily headache, usually right-sided, has dizziness, nausea, vomiting.  Twice, claims she has had severe headache, had loss of vision lasted about 2 minutes.  When the headache is bad she has blurry vision.  Took Aimovig 70 mg once, did not improve her headache, did not continue.  No longer on Lamictal.  Claims mood disorder is under good control.  She is a Museum/gallery conservator.  Today, reports right-sided headache level 5 out of 10.  Sometimes with her headache she may get tingling to the right side.  REVIEW OF SYSTEMS: Out of a complete 14 system review of symptoms, the patient complains only of the following symptoms, and all other reviewed systems are negative.  See HPI  ALLERGIES: Allergies  Allergen  Reactions   Dopamine     Makes WBC rise cardiac arrest   Peanut-Containing Drug Products     Hazel nuts [place] nuts pistachios    HOME MEDICATIONS: Outpatient Medications Prior to Visit  Medication Sig Dispense Refill   albuterol (VENTOLIN HFA) 108 (90 Base) MCG/ACT inhaler Inhale 2 puffs into the lungs every 6 (six) hours as needed.     JUNEL 1/20 1-20 MG-MCG tablet Take 1 tablet by mouth daily.     lurasidone (LATUDA) 40 MG TABS tablet Take 1 tablet by mouth daily.     promethazine (PHENERGAN) 25 MG tablet Take 1 tablet (25 mg total) by mouth every 8 (eight) hours as needed for nausea or vomiting. May cause drowsiness 30 tablet 0   venlafaxine XR (EFFEXOR-XR) 75 MG 24 hr capsule Take 1 capsule by mouth daily.     Vitamin D, Ergocalciferol, (DRISDOL) 1.25 MG (50000 UNIT) CAPS capsule Take 1 capsule (50,000 Units total) by mouth every 7 (seven) days. 12 capsule 3   No facility-administered medications prior to visit.    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   Schizoaffective disorder (HCC)    Tetralogy of Fallot     PAST SURGICAL HISTORY: Past Surgical History:  Procedure Laterality Date   BIOPSY  03/08/2019   Procedure: BIOPSY;  Surgeon: Jeani Hawking, MD;  Location: [facility];  Service: Endoscopy;;   CARDIAC SURGERY     CARDIAC SURGERY     4 open heart surgeries   ESOPHAGOGASTRODUODENOSCOPY (EGD) WITH PROPOFOL N/A 03/08/2019   Procedure: ESOPHAGOGASTRODUODENOSCOPY (EGD) WITH PROPOFOL;  Surgeon: Jeani Hawking, MD;  Location: [facility];  Service: Endoscopy;  Laterality: N/A;   GASTROSTOMY W/ FEEDING TUBE     removed 1 year ago    THORACIC DUCT  LIGATION      FAMILY HISTORY: Family History  Problem Relation Age of Onset   Hypertension Mother    Healthy Father    Hypertension Maternal Grandmother    Diabetes Maternal Grandfather    Heart disease Maternal Grandfather    Stroke Maternal Grandfather    Kidney disease Paternal Grandfather    Heart attack Other    Stroke Other    Colon cancer Neg Hx     SOCIAL HISTORY: Social History   Socioeconomic History   Marital status: Single    Spouse name: Not on file   Number of children: 0   Years of education: College   Highest education level: Not on file  Occupational History   Occupation: Consulting civil engineer   Occupation: Data processing manager  Tobacco Use   Smoking status: Never   Smokeless tobacco: Never  Vaping Use   Vaping Use: Never used  Substance and Sexual Activity   Alcohol use: Yes    Comment: occas   Drug use: Yes    Types: Marijuana    Comment: Occas.  Hemp   Sexual activity: Not Currently  Partners: Male  Other Topics Concern   Not on file  Social History Narrative   Lives at home with mother.   Right-handed.   No more than 2 cups caffeine per day.      Works at [employer]   Social Determinants of Health   Financial Resource Strain: Not on file  Food Insecurity: Not on file  Transportation Needs: Not on file  Physical Activity: Not on file  Stress: Not on file  Social Connections: Not on file  Intimate Partner Violence: Not on file    PHYSICAL EXAM  Vitals:   12/03/21 1521  BP: 106/75  Pulse: (!) 101  Weight: 127 lb (57.6 kg)  Height: 5\' 5"  (1.651 m)   Body mass index is 21.13 kg/m.  Generalized: Well developed, in no acute distress  Neurological examination  Mentation: Alert oriented to time, place, history taking. Follows all commands speech and language fluent Cranial nerve II-XII: Pupils were equal round reactive to light. Extraocular movements were full, visual field were full on confrontational test. Facial sensation and  strength were normal. Head turning and shoulder shrug  were normal and symmetric. Motor: The motor testing reveals 5 over 5 strength of all 4 extremities. Good symmetric motor tone is noted throughout.  Sensory: Sensory testing is intact to soft touch on all 4 extremities. No evidence of extinction is noted.  Coordination: Cerebellar testing reveals good finger-nose-finger and heel-to-shin bilaterally.  Gait and station: Gait is normal.    DIAGNOSTIC DATA (LABS, IMAGING, TESTING) - I reviewed patient records, labs, notes, testing and imaging myself where available.  Lab Results  Component Value Date   WBC 3.7 10/06/2021   HGB 14.9 10/06/2021   HCT 43.9 10/06/2021   MCV 89 10/06/2021   PLT 122 (L) 10/06/2021      Component Value Date/Time   NA 138 10/06/2021 1348   K 4.4 10/06/2021 1348   CL 102 10/06/2021 1348   CO2 19 (L) 10/06/2021 1348   GLUCOSE 95 10/06/2021 1348   GLUCOSE 108 (H) 11/24/2020 1843   BUN 8 10/06/2021 1348   CREATININE 0.86 10/06/2021 1348   CALCIUM 9.9 10/06/2021 1348   PROT 7.3 10/06/2021 1348   ALBUMIN 4.7 10/06/2021 1348   AST 19 10/06/2021 1348   ALT 14 10/06/2021 1348   ALKPHOS 63 10/06/2021 1348   BILITOT 0.6 10/06/2021 1348   GFRNONAA >60 11/24/2020 1843   GFRAA >60 02/12/2020 1806   Lab Results  Component Value Date   CHOL 158 02/12/2020   HDL 58 02/12/2020   LDLCALC 82 02/12/2020   TRIG 89 02/12/2020   CHOLHDL 2.7 02/12/2020   Lab Results  Component Value Date   HGBA1C 5.8 (H) 02/12/2020   Lab Results  Component Value Date   VITAMINB12 419 02/23/2021   Lab Results  Component Value Date   TSH 2.210 01/26/2021    03/28/2021, AGNP-C, DNP 12/03/2021, 4:47 PM [facility] [address] [place], [place] [phone]

## 2021-12-07 ENCOUNTER — Telehealth: Payer: Self-pay | Admitting: *Deleted

## 2021-12-07 NOTE — Telephone Encounter (Addendum)
PA for Nurtec started on covermymeds (key: [id number]). Approved through 06/03/2022.  There was an issue submitting Emgality through covermymeds. I called the plan at [phone] and completed the PA over the phone. It was denied by the pharmacist. They only allow one CGRP, despite one being for prevention and the other for acute needs.   Pharmacy coverage through [employer]/[facility] [phone]).

## 2021-12-09 ENCOUNTER — Ambulatory Visit: Payer: Federal, State, Local not specified - PPO | Admitting: Internal Medicine

## 2021-12-09 DIAGNOSIS — F4310 Post-traumatic stress disorder, unspecified: Secondary | ICD-10-CM | POA: Diagnosis not present

## 2021-12-09 DIAGNOSIS — F250 Schizoaffective disorder, bipolar type: Secondary | ICD-10-CM | POA: Diagnosis not present

## 2021-12-09 NOTE — Telephone Encounter (Signed)
We will need to speak to Alexis [name] for clarification of treatment. If we are going to continue Alexis [name] for acute migraines, then she will need a medication for preventive care that is not a CGRP.  If Alexis [name] prefers she use Emaglity for prevention, then she will need a rescue medication that is not a CGRP. If we go this route, her plan will have to contacted back to void the Alexis [name] approval and complete the PA again for Emgality.   The patient will need an update, once treatment has been decided.

## 2021-12-10 ENCOUNTER — Encounter: Payer: Self-pay | Admitting: Neurology

## 2021-12-10 NOTE — Telephone Encounter (Signed)
We may consider an appeal, at this point she has tried Maxalt and Imitrex without benefit, has extensive cardiac history with history of tetralogy of fallot with multiple open heart surgeries.  In my opinion, this is an excellent patient to utilize a CGRP for acute and preventative indications, given her frequent occurrence and severity of migraines. With some of her recent headaches she had reported a right sided sensory component, further inclination to try a CGRP vs another triptan.   You may give her a choice if she would rather try Emgality as preventative, or Nurtec which we may use for rescue or prevention.

## 2021-12-10 NOTE — Telephone Encounter (Signed)
I have reached out to [facility] department spoke with [place] and we canceled the nurtec PA and processed emgality PA.  PA was approved instantly  Ref # [id number] moutainstandard time No PA number provided  Auth is good from 11/10/2021-06/08/2021. Pt updated on this and let us know if any issues come up.

## 2021-12-10 NOTE — Telephone Encounter (Signed)
I called pt. No answer, left a message asking pt to call me back. Also advised she could message through [place] if more convenient.

## 2021-12-10 NOTE — Telephone Encounter (Signed)
I talked with pt; she reports she was able to pick up her Nurtec rx last week and will keep on had as needed.   Going forward she would prefer to try the emgaility. She understands the insurance will only pay for one ( nurtec or emgality). I advised I would work on this and be back in touch with her.   I will have to call insurance and have them cancel the Nurtec PA out and resubmit the PA for the emgality. Pt was advised this may take a few days since we are coming up on the weekend and she was understanding/appreciative.

## 2021-12-16 DIAGNOSIS — F4310 Post-traumatic stress disorder, unspecified: Secondary | ICD-10-CM | POA: Diagnosis not present

## 2021-12-16 DIAGNOSIS — F250 Schizoaffective disorder, bipolar type: Secondary | ICD-10-CM | POA: Diagnosis not present

## 2021-12-23 DIAGNOSIS — F250 Schizoaffective disorder, bipolar type: Secondary | ICD-10-CM | POA: Diagnosis not present

## 2021-12-23 DIAGNOSIS — F4310 Post-traumatic stress disorder, unspecified: Secondary | ICD-10-CM | POA: Diagnosis not present

## 2021-12-30 DIAGNOSIS — F250 Schizoaffective disorder, bipolar type: Secondary | ICD-10-CM | POA: Diagnosis not present

## 2021-12-30 DIAGNOSIS — F4310 Post-traumatic stress disorder, unspecified: Secondary | ICD-10-CM | POA: Diagnosis not present

## 2022-01-06 DIAGNOSIS — F250 Schizoaffective disorder, bipolar type: Secondary | ICD-10-CM | POA: Diagnosis not present

## 2022-01-06 DIAGNOSIS — F4310 Post-traumatic stress disorder, unspecified: Secondary | ICD-10-CM | POA: Diagnosis not present

## 2022-01-07 MED ORDER — EMGALITY 120 MG/ML ~~LOC~~ SOAJ: 120.0000 mg | SUBCUTANEOUS | 11 refills | Status: DC

## 2022-01-07 NOTE — Addendum Note (Signed)
Addended by: Ann Maki on: 01/07/2022 08:22 AM   Modules accepted: Orders

## 2022-01-12 DIAGNOSIS — R059 Cough, unspecified: Secondary | ICD-10-CM | POA: Diagnosis not present

## 2022-01-12 DIAGNOSIS — B349 Viral infection, unspecified: Secondary | ICD-10-CM | POA: Diagnosis not present

## 2022-01-12 DIAGNOSIS — R0989 Other specified symptoms and signs involving the circulatory and respiratory systems: Secondary | ICD-10-CM | POA: Diagnosis not present

## 2022-01-14 DIAGNOSIS — F410 Panic disorder [episodic paroxysmal anxiety] without agoraphobia: Secondary | ICD-10-CM | POA: Diagnosis not present

## 2022-01-14 DIAGNOSIS — F411 Generalized anxiety disorder: Secondary | ICD-10-CM | POA: Diagnosis not present

## 2022-01-14 DIAGNOSIS — F4310 Post-traumatic stress disorder, unspecified: Secondary | ICD-10-CM | POA: Diagnosis not present

## 2022-01-16 DIAGNOSIS — J208 Acute bronchitis due to other specified organisms: Secondary | ICD-10-CM | POA: Diagnosis not present

## 2022-01-20 ENCOUNTER — Encounter: Payer: Self-pay | Admitting: Internal Medicine

## 2022-01-20 DIAGNOSIS — F250 Schizoaffective disorder, bipolar type: Secondary | ICD-10-CM | POA: Diagnosis not present

## 2022-01-20 DIAGNOSIS — F4310 Post-traumatic stress disorder, unspecified: Secondary | ICD-10-CM | POA: Diagnosis not present

## 2022-01-27 ENCOUNTER — Encounter: Payer: Federal, State, Local not specified - PPO | Admitting: Family Medicine

## 2022-01-27 DIAGNOSIS — F4310 Post-traumatic stress disorder, unspecified: Secondary | ICD-10-CM | POA: Diagnosis not present

## 2022-01-27 DIAGNOSIS — F250 Schizoaffective disorder, bipolar type: Secondary | ICD-10-CM | POA: Diagnosis not present

## 2022-01-28 ENCOUNTER — Encounter: Payer: Federal, State, Local not specified - PPO | Admitting: Physician Assistant

## 2022-02-02 ENCOUNTER — Encounter (INDEPENDENT_AMBULATORY_CARE_PROVIDER_SITE_OTHER): Payer: Self-pay

## 2022-02-03 DIAGNOSIS — F4310 Post-traumatic stress disorder, unspecified: Secondary | ICD-10-CM | POA: Diagnosis not present

## 2022-02-03 DIAGNOSIS — F250 Schizoaffective disorder, bipolar type: Secondary | ICD-10-CM | POA: Diagnosis not present

## 2022-02-08 ENCOUNTER — Other Ambulatory Visit (INDEPENDENT_AMBULATORY_CARE_PROVIDER_SITE_OTHER): Payer: Federal, State, Local not specified - PPO

## 2022-02-08 DIAGNOSIS — Z23 Encounter for immunization: Secondary | ICD-10-CM

## 2022-02-10 DIAGNOSIS — F250 Schizoaffective disorder, bipolar type: Secondary | ICD-10-CM | POA: Diagnosis not present

## 2022-02-10 DIAGNOSIS — F4310 Post-traumatic stress disorder, unspecified: Secondary | ICD-10-CM | POA: Diagnosis not present

## 2022-02-11 DIAGNOSIS — F411 Generalized anxiety disorder: Secondary | ICD-10-CM | POA: Diagnosis not present

## 2022-02-11 DIAGNOSIS — F410 Panic disorder [episodic paroxysmal anxiety] without agoraphobia: Secondary | ICD-10-CM | POA: Diagnosis not present

## 2022-02-11 DIAGNOSIS — F4310 Post-traumatic stress disorder, unspecified: Secondary | ICD-10-CM | POA: Diagnosis not present

## 2022-02-17 DIAGNOSIS — F4310 Post-traumatic stress disorder, unspecified: Secondary | ICD-10-CM | POA: Diagnosis not present

## 2022-02-17 DIAGNOSIS — F250 Schizoaffective disorder, bipolar type: Secondary | ICD-10-CM | POA: Diagnosis not present

## 2022-02-23 ENCOUNTER — Inpatient Hospital Stay: Payer: Federal, State, Local not specified - PPO | Admitting: Nurse Practitioner

## 2022-02-23 ENCOUNTER — Encounter: Payer: Self-pay | Admitting: Internal Medicine

## 2022-02-23 ENCOUNTER — Inpatient Hospital Stay: Payer: Federal, State, Local not specified - PPO | Attending: Family Medicine

## 2022-02-23 DIAGNOSIS — Q213 Tetralogy of Fallot: Secondary | ICD-10-CM | POA: Diagnosis not present

## 2022-02-23 DIAGNOSIS — Q210 Ventricular septal defect: Secondary | ICD-10-CM | POA: Diagnosis not present

## 2022-02-23 DIAGNOSIS — Q255 Atresia of pulmonary artery: Secondary | ICD-10-CM | POA: Diagnosis not present

## 2022-03-10 DIAGNOSIS — F250 Schizoaffective disorder, bipolar type: Secondary | ICD-10-CM | POA: Diagnosis not present

## 2022-03-10 DIAGNOSIS — F4310 Post-traumatic stress disorder, unspecified: Secondary | ICD-10-CM | POA: Diagnosis not present

## 2022-03-11 DIAGNOSIS — F4310 Post-traumatic stress disorder, unspecified: Secondary | ICD-10-CM | POA: Diagnosis not present

## 2022-03-17 DIAGNOSIS — F4310 Post-traumatic stress disorder, unspecified: Secondary | ICD-10-CM | POA: Diagnosis not present

## 2022-03-17 DIAGNOSIS — F250 Schizoaffective disorder, bipolar type: Secondary | ICD-10-CM | POA: Diagnosis not present

## 2022-03-23 DIAGNOSIS — F29 Unspecified psychosis not due to a substance or known physiological condition: Secondary | ICD-10-CM | POA: Diagnosis not present

## 2022-03-23 DIAGNOSIS — F331 Major depressive disorder, recurrent, moderate: Secondary | ICD-10-CM | POA: Diagnosis not present

## 2022-03-30 ENCOUNTER — Encounter: Payer: Self-pay | Admitting: Internal Medicine

## 2022-03-31 DIAGNOSIS — F4310 Post-traumatic stress disorder, unspecified: Secondary | ICD-10-CM | POA: Diagnosis not present

## 2022-03-31 DIAGNOSIS — F250 Schizoaffective disorder, bipolar type: Secondary | ICD-10-CM | POA: Diagnosis not present

## 2022-04-05 ENCOUNTER — Ambulatory Visit: Payer: Federal, State, Local not specified - PPO | Admitting: Neurology

## 2022-04-06 DIAGNOSIS — F4310 Post-traumatic stress disorder, unspecified: Secondary | ICD-10-CM | POA: Diagnosis not present

## 2022-04-06 DIAGNOSIS — F250 Schizoaffective disorder, bipolar type: Secondary | ICD-10-CM | POA: Diagnosis not present

## 2022-04-09 ENCOUNTER — Emergency Department (HOSPITAL_BASED_OUTPATIENT_CLINIC_OR_DEPARTMENT_OTHER): Payer: No Typology Code available for payment source | Admitting: Radiology

## 2022-04-09 ENCOUNTER — Encounter (HOSPITAL_BASED_OUTPATIENT_CLINIC_OR_DEPARTMENT_OTHER): Payer: Self-pay

## 2022-04-09 ENCOUNTER — Other Ambulatory Visit: Payer: Self-pay

## 2022-04-09 ENCOUNTER — Encounter (HOSPITAL_COMMUNITY): Payer: Self-pay

## 2022-04-09 ENCOUNTER — Inpatient Hospital Stay (HOSPITAL_BASED_OUTPATIENT_CLINIC_OR_DEPARTMENT_OTHER)
Admission: EM | Admit: 2022-04-09 | Discharge: 2022-04-12 | DRG: 602 | Disposition: A | Discharge status: Home / Self Care | Payer: No Typology Code available for payment source | Attending: Student | Admitting: Student

## 2022-04-09 DIAGNOSIS — F411 Generalized anxiety disorder: Secondary | ICD-10-CM | POA: Diagnosis not present

## 2022-04-09 DIAGNOSIS — Z8774 Personal history of (corrected) congenital malformations of heart and circulatory system: Secondary | ICD-10-CM | POA: Diagnosis not present

## 2022-04-09 DIAGNOSIS — Z8249 Family history of ischemic heart disease and other diseases of the circulatory system: Secondary | ICD-10-CM | POA: Diagnosis not present

## 2022-04-09 DIAGNOSIS — W5501XA Bitten by cat, initial encounter: Secondary | ICD-10-CM | POA: Diagnosis not present

## 2022-04-09 DIAGNOSIS — Q213 Tetralogy of Fallot: Secondary | ICD-10-CM

## 2022-04-09 DIAGNOSIS — M659 Synovitis and tenosynovitis, unspecified: Secondary | ICD-10-CM | POA: Diagnosis not present

## 2022-04-09 DIAGNOSIS — Z833 Family history of diabetes mellitus: Secondary | ICD-10-CM | POA: Diagnosis not present

## 2022-04-09 DIAGNOSIS — F329 Major depressive disorder, single episode, unspecified: Secondary | ICD-10-CM | POA: Diagnosis not present

## 2022-04-09 DIAGNOSIS — Z823 Family history of stroke: Secondary | ICD-10-CM

## 2022-04-09 DIAGNOSIS — D696 Thrombocytopenia, unspecified: Secondary | ICD-10-CM | POA: Diagnosis not present

## 2022-04-09 DIAGNOSIS — F259 Schizoaffective disorder, unspecified: Secondary | ICD-10-CM | POA: Diagnosis present

## 2022-04-09 DIAGNOSIS — L089 Local infection of the skin and subcutaneous tissue, unspecified: Secondary | ICD-10-CM | POA: Diagnosis not present

## 2022-04-09 DIAGNOSIS — L03113 Cellulitis of right upper limb: Principal | ICD-10-CM | POA: Diagnosis present

## 2022-04-09 DIAGNOSIS — Z79899 Other long term (current) drug therapy: Secondary | ICD-10-CM | POA: Diagnosis not present

## 2022-04-09 DIAGNOSIS — S61451A Open bite of right hand, initial encounter: Secondary | ICD-10-CM | POA: Diagnosis present

## 2022-04-09 DIAGNOSIS — G43909 Migraine, unspecified, not intractable, without status migrainosus: Secondary | ICD-10-CM | POA: Diagnosis present

## 2022-04-09 DIAGNOSIS — S61452A Open bite of left hand, initial encounter: Secondary | ICD-10-CM | POA: Diagnosis not present

## 2022-04-09 DIAGNOSIS — D6959 Other secondary thrombocytopenia: Secondary | ICD-10-CM | POA: Diagnosis not present

## 2022-04-09 DIAGNOSIS — M7989 Other specified soft tissue disorders: Secondary | ICD-10-CM | POA: Diagnosis not present

## 2022-04-09 DIAGNOSIS — Z888 Allergy status to other drugs, medicaments and biological substances status: Secondary | ICD-10-CM

## 2022-04-09 DIAGNOSIS — Z8659 Personal history of other mental and behavioral disorders: Secondary | ICD-10-CM | POA: Diagnosis not present

## 2022-04-09 LAB — CBC WITH DIFFERENTIAL/PLATELET
Abs Immature Granulocytes: 0.02 10*3/uL (ref 0.00–0.07)
Basophils Absolute: 0 10*3/uL (ref 0.0–0.1)
Basophils Relative: 1 %
Eosinophils Absolute: 0 10*3/uL (ref 0.0–0.5)
Eosinophils Relative: 0 %
HCT: 39.3 % (ref 36.0–46.0)
Hemoglobin: 13 g/dL (ref 12.0–15.0)
Immature Granulocytes: 0 %
Lymphocytes Relative: 12 %
Lymphs Abs: 0.7 10*3/uL (ref 0.7–4.0)
MCH: 30.8 pg (ref 26.0–34.0)
MCHC: 33.1 g/dL (ref 30.0–36.0)
MCV: 93.1 fL (ref 80.0–100.0)
Monocytes Absolute: 0.5 10*3/uL (ref 0.1–1.0)
Monocytes Relative: 9 %
Neutro Abs: 4.8 10*3/uL (ref 1.7–7.7)
Neutrophils Relative %: 78 %
Platelets: 123 10*3/uL — ABNORMAL LOW (ref 150–400)
RBC: 4.22 MIL/uL (ref 3.87–5.11)
RDW: 12.8 % (ref 11.5–15.5)
WBC: 6.1 10*3/uL (ref 4.0–10.5)
nRBC: 0 % (ref 0.0–0.2)

## 2022-04-09 LAB — BASIC METABOLIC PANEL
Anion gap: 9 (ref 5–15)
BUN: 11 mg/dL (ref 6–20)
CO2: 23 mmol/L (ref 22–32)
Calcium: 9.6 mg/dL (ref 8.9–10.3)
Chloride: 104 mmol/L (ref 98–111)
Creatinine, Ser: 0.72 mg/dL (ref 0.44–1.00)
GFR, Estimated: >60 mL/min (ref >60)
Glucose, Bld: 126 mg/dL — ABNORMAL HIGH (ref 70–99)
Potassium: 3.8 mmol/L (ref 3.5–5.1)
Sodium: 136 mmol/L (ref 135–145)

## 2022-04-09 LAB — C-REACTIVE PROTEIN: CRP: 4 mg/dL — ABNORMAL HIGH (ref <1.0)

## 2022-04-09 LAB — LACTIC ACID, PLASMA
Lactic Acid, Venous: 0.7 mmol/L (ref 0.5–1.9)
Lactic Acid, Venous: 1.3 mmol/L (ref 0.5–1.9)

## 2022-04-09 LAB — PREGNANCY, URINE: Preg Test, Ur: NEGATIVE

## 2022-04-09 LAB — SEDIMENTATION RATE: Sed Rate: 3 mm/hr (ref 0–22)

## 2022-04-09 MED ORDER — PIPERACILLIN-TAZOBACTAM 4.5 G IVPB: 4.5000 g | Freq: Once | INTRAVENOUS | Status: DC

## 2022-04-09 MED ORDER — OXYCODONE-ACETAMINOPHEN 5-325 MG PO TABS
1.0000 | ORAL_TABLET | Freq: Once | ORAL | Status: AC
  Administered 2022-04-09: 1 via ORAL
  Filled 2022-04-09: qty 1

## 2022-04-09 MED ORDER — PIPERACILLIN-TAZOBACTAM 3.375 G IVPB 30 MIN
3.3750 g | Freq: Once | INTRAVENOUS | Status: AC
  Administered 2022-04-09: 3.375 g via INTRAVENOUS
  Filled 2022-04-09: qty 50

## 2022-04-09 NOTE — ED Notes (Signed)
[facility] has been called, ready for patient transport  

## 2022-04-09 NOTE — ED Provider Notes (Signed)
[facility] EMERGENCY DEPT Provider Note   CSN: [id number] Arrival date & time: 04/09/22  1333     History  Chief Complaint  Patient presents with   Animal Bite    Alexis [name] is a 26 y.o. female.   Animal Bite    Patient presents due to cat bite.  Patient works as a vet tech, had cat bite 2 days ago.  Tetanus is up-to-date, will start on Augmentin which she has been taking religiously.  Despite that she has been having spreading erythema starting last night which has been progressively spreading up her hand.  She also expressed purulent discharge at the site of the puncture wound at work today and was told to go to the ED.  She denies any fevers or chills, she is unable to flex her index finger on the right hand.  Right-hand-dominant.  Home Medications Prior to Admission medications   Medication Sig Start Date End Date Taking? Authorizing Provider  albuterol (VENTOLIN HFA) 108 (90 Base) MCG/ACT inhaler Inhale 2 puffs into the lungs every 6 (six) hours as needed. 10/13/21   [provider]  Galcanezumab-gnlm (EMGALITY) 120 MG/ML SOAJ Inject 120 mg into the skin every 30 (thirty) days. 01/07/22   Slack, Sarah J, NP  JUNEL 1/20 1-20 MG-MCG tablet Take 1 tablet by mouth daily. 02/04/21   [provider]  promethazine (PHENERGAN) 25 MG tablet Take 1 tablet (25 mg total) by mouth every 8 (eight) hours as needed for nausea or vomiting. May cause drowsiness 10/06/21   Williams, Lynne B, PA-C  Rimegepant Sulfate (NURTEC) 75 MG TBDP Take 75 mg by mouth as needed (take 1 tablet at onset of headache). 12/03/21   Slack, Sarah J, NP  venlafaxine XR (EFFEXOR-XR) 75 MG 24 hr capsule Take 1 capsule by mouth daily. 10/08/21   [provider]  Vitamin D, Ergocalciferol, (DRISDOL) 1.25 MG (50000 UNIT) CAPS capsule Take 1 capsule (50,000 Units total) by mouth every 7 (seven) days. 10/06/21   Williams, Lynne B, PA-C      Allergies    Dopamine and Peanut-containing  drug products    Review of Systems   Review of Systems  Physical Exam Updated Vital Signs BP 129/73 (BP Location: Left Arm)   Pulse 82   Temp 98.6 F (37 C) (Oral)   Resp 18   Ht 5' 5" (1.651 m)   Wt 55.6 kg   SpO2 100%   BMI 20.40 kg/m  Physical Exam Vitals and nursing note reviewed. Exam conducted with a chaperone present.  Constitutional:      General: She is not in acute distress.    Appearance: Normal appearance.  HENT:     Head: Normocephalic and atraumatic.  Eyes:     General: No scleral icterus.    Extraocular Movements: Extraocular movements intact.     Pupils: Pupils are equal, round, and reactive to light.  Cardiovascular:     Pulses: Normal pulses.  Musculoskeletal:        General: Swelling and tenderness present.     Comments: Patient is unable to flex index finger right hand.  Unable to fully extend digits 3 through 5 greater than picture, see ED course.  Soft tissue edema but I do not palpate any crepitus.  Skin:    Capillary Refill: Capillary refill takes less than 2 seconds.     Coloration: Skin is not jaundiced.     Findings: Erythema present.  Neurological:     Mental Status: She   is alert. Mental status is at baseline.     Coordination: Coordination normal.       ED Results / Procedures / Treatments   Labs (all labs ordered are listed, but only abnormal results are displayed) Labs Reviewed  CBC WITH DIFFERENTIAL/PLATELET - Abnormal; Notable for the following components:      Result Value   Platelets 123 (*)    All other components within normal limits  BASIC METABOLIC PANEL - Abnormal; Notable for the following components:   Glucose, Bld 126 (*)    All other components within normal limits  CULTURE, BLOOD (ROUTINE X 2)  CULTURE, BLOOD (ROUTINE X 2)  SEDIMENTATION RATE  PREGNANCY, URINE  LACTIC ACID, PLASMA  LACTIC ACID, PLASMA  C-REACTIVE PROTEIN    EKG None  Radiology DG Hand Complete Right  Result Date: 04/09/2022 CLINICAL  DATA:  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 EXAM: RIGHT HAND - COMPLETE 3+ VIEW COMPARISON:  None Available. FINDINGS: There is no evidence of fracture or dislocation. There is no evidence of arthropathy or other focal bone abnormality. Soft tissues swelling about the dorsum of the hand. No radiopaque foreign body. IMPRESSION: Soft tissue swelling about the dorsum of the hand. No radiopaque foreign body. No acute osseous abnormality Electronically Signed   By: Keane Police D.O.   On: 04/09/2022 15:25    Procedures Procedures    Medications Ordered in ED Medications  piperacillin-tazobactam (ZOSYN) IVPB 3.375 g (0 g Intravenous Stopped 04/09/22 1858)    ED Course/ Medical Decision Making/ A&P Clinical Course as of 04/09/22 1900  Fri Apr 09, 2022  1717 I consulted with Dr. Greta Doom on-call for orthopedic hand surgery.  Advises admission for IV antibiotics requesting inflammatory labs ESR and CRP.  Patient appropriate for [facility] or [facility].  [HS]    Clinical Course User Index [HS] Sherrill Raring, PA-C                           Medical Decision Making Amount and/or Complexity of Data Reviewed Labs: ordered. Radiology: ordered.  Risk Prescription drug management. Decision regarding hospitalization.   This is a 26 year old female presenting today due to animal bite.  Differential includes not limited to cellulitis, flexor tenosynovitis, sepsis.  On exam patient is significantly decreased ROM to right index.  She has good sensation, radial pulses 2+ and cap refills less than 2 in each digit.  I do not express any purulence but she is significantly reduced ROM, spreading erythema and warmth to the upper extremity over to the wrist as detailed in the photo above.  I am concerned about left tonsillitis versus cellulitis.  I ordered Zosyn for the patient.  I ordered, interpreted and viewed laboratory workup.  No leukocytosis or anemia.  Patient not pregnant, BMP without gross electrolyte, AKI.  ESR  within normal limit, lactic nondetectable, not septic.  X-ray shows soft tissue edema but no specific bony abnormality.  I consulted Dr. Greta Doom with orthopedics documented ED course.  He recommends IV antibiotics, hospitalist admission orthopedic to consult.  I consulted the hospitalist agrees with admission.        Final Clinical Impression(s) / ED Diagnoses Final diagnoses:  Cellulitis of right upper extremity    Rx / DC Orders ED Discharge Orders     None         Sherrill Raring, Hershal Coria 04/09/22 1900    Ezequiel Essex, MD 04/09/22 2346

## 2022-04-09 NOTE — ED Triage Notes (Signed)
Pt was bitten by cat on Wednesday, went to urgent care and is on antibiotics but bite on right hand is getting worse.

## 2022-04-09 NOTE — Progress Notes (Addendum)
Plan of Care Note for accepted transfer   Patient: Martin[place] N Zundel MRN: [id number]   DOA: 04/09/2022  Facility requesting transfer: [facility] Requesting Provider: Sherrill Raring, PA-C Reason for transfer: Cellulitis of right hand Facility course: [place] of care: Patient is a 26 year old female with a cat bite which  occurred 2 days ago, she has been taking Augmentin without any improvement, last night, she noted an erythema has been spreading up her hand and she also complained of purulent discharge at the puncture site of the wound.  She went to an urgent care and was told to go to ED for further evaluation and management.  She complained of difficulty in being able to flex right index finger.  Workup in the ED showed normal CBC except thrombocytopenia, BMP was normal except for CBG of 126, lactic acid was negative, pregnancy test was negative, sed rate was 3. Right hand x-ray showed soft tissue swelling about the dorsum of the hand.  No radiopaque foreign body.  No acute osseous abnormality. Orthopedic hand surgery (Dr. Greta Doom) was consulted and recommended IV antibiotics, ESR and CRP patient can be admitted to [facility] or [facility]. She was started on IV Zosyn. [facility] was consulted for admission and further management.   The patient is accepted for admission to [place]  unit, at [facility]. While holding at [facility], medical decision making responsibilities remain with the [place]. Upon arrival to [facility], [facility] will assume care. Thank you.    Author: Bernadette Hoit, DO 04/09/2022  Check [email or web address] for on-call coverage.  Nursing staff, Please call [place] number on Amion as soon as patient's arrival, so appropriate admitting provider can evaluate the pt.

## 2022-04-10 ENCOUNTER — Encounter (HOSPITAL_COMMUNITY): Payer: Self-pay | Admitting: Internal Medicine

## 2022-04-10 DIAGNOSIS — Z8774 Personal history of (corrected) congenital malformations of heart and circulatory system: Secondary | ICD-10-CM

## 2022-04-10 DIAGNOSIS — M659 Synovitis and tenosynovitis, unspecified: Secondary | ICD-10-CM

## 2022-04-10 DIAGNOSIS — D6959 Other secondary thrombocytopenia: Secondary | ICD-10-CM | POA: Diagnosis present

## 2022-04-10 DIAGNOSIS — F329 Major depressive disorder, single episode, unspecified: Secondary | ICD-10-CM

## 2022-04-10 DIAGNOSIS — Z833 Family history of diabetes mellitus: Secondary | ICD-10-CM | POA: Diagnosis not present

## 2022-04-10 DIAGNOSIS — W5501XA Bitten by cat, initial encounter: Secondary | ICD-10-CM | POA: Diagnosis not present

## 2022-04-10 DIAGNOSIS — Z8249 Family history of ischemic heart disease and other diseases of the circulatory system: Secondary | ICD-10-CM | POA: Diagnosis not present

## 2022-04-10 DIAGNOSIS — G43909 Migraine, unspecified, not intractable, without status migrainosus: Secondary | ICD-10-CM | POA: Diagnosis present

## 2022-04-10 DIAGNOSIS — F411 Generalized anxiety disorder: Secondary | ICD-10-CM | POA: Diagnosis present

## 2022-04-10 DIAGNOSIS — Q213 Tetralogy of Fallot: Secondary | ICD-10-CM | POA: Diagnosis not present

## 2022-04-10 DIAGNOSIS — D696 Thrombocytopenia, unspecified: Secondary | ICD-10-CM | POA: Diagnosis not present

## 2022-04-10 DIAGNOSIS — L03113 Cellulitis of right upper limb: Secondary | ICD-10-CM

## 2022-04-10 DIAGNOSIS — Z823 Family history of stroke: Secondary | ICD-10-CM | POA: Diagnosis not present

## 2022-04-10 DIAGNOSIS — M65941 Unspecified synovitis and tenosynovitis, right hand: Secondary | ICD-10-CM

## 2022-04-10 DIAGNOSIS — Z888 Allergy status to other drugs, medicaments and biological substances status: Secondary | ICD-10-CM | POA: Diagnosis not present

## 2022-04-10 DIAGNOSIS — Z8659 Personal history of other mental and behavioral disorders: Secondary | ICD-10-CM | POA: Diagnosis not present

## 2022-04-10 DIAGNOSIS — F259 Schizoaffective disorder, unspecified: Secondary | ICD-10-CM | POA: Diagnosis present

## 2022-04-10 DIAGNOSIS — S61451A Open bite of right hand, initial encounter: Secondary | ICD-10-CM | POA: Diagnosis present

## 2022-04-10 DIAGNOSIS — Z79899 Other long term (current) drug therapy: Secondary | ICD-10-CM | POA: Diagnosis not present

## 2022-04-10 MED ORDER — MORPHINE SULFATE (PF) 2 MG/ML IV SOLN: 2.0000 mg | INTRAVENOUS | Status: DC | PRN

## 2022-04-10 MED ORDER — HYDROMORPHONE HCL 1 MG/ML IJ SOLN
0.5000 mg | INTRAMUSCULAR | Status: DC | PRN
  Administered 2022-04-10: 1 mg via INTRAVENOUS
  Filled 2022-04-10: qty 1

## 2022-04-10 MED ORDER — PROCHLORPERAZINE EDISYLATE 10 MG/2ML IJ SOLN
INTRAMUSCULAR | Status: AC
  Administered 2022-04-10: 10 mg
  Filled 2022-04-10: qty 2

## 2022-04-10 MED ORDER — ONDANSETRON HCL 4 MG/2ML IJ SOLN: 4.0000 mg | Freq: Four times a day (QID) | INTRAMUSCULAR | Status: DC | PRN

## 2022-04-10 MED ORDER — OXYCODONE HCL 5 MG PO TABS
5.0000 mg | ORAL_TABLET | Freq: Once | ORAL | Status: AC
  Administered 2022-04-10: 5 mg via ORAL
  Filled 2022-04-10: qty 1

## 2022-04-10 MED ORDER — VANCOMYCIN HCL IN DEXTROSE 1-5 GM/200ML-% IV SOLN
1000.0000 mg | Freq: Once | INTRAVENOUS | Status: DC
  Filled 2022-04-10: qty 200

## 2022-04-10 MED ORDER — ONDANSETRON HCL 4 MG PO TABS: 4.0000 mg | ORAL_TABLET | Freq: Four times a day (QID) | ORAL | Status: DC | PRN

## 2022-04-10 MED ORDER — PIPERACILLIN-TAZOBACTAM 3.375 G IVPB: 3.3750 g | Freq: Three times a day (TID) | INTRAVENOUS | Status: DC

## 2022-04-10 MED ORDER — ACETAMINOPHEN 325 MG PO TABS
650.0000 mg | ORAL_TABLET | Freq: Four times a day (QID) | ORAL | Status: DC | PRN
  Administered 2022-04-10 – 2022-04-11 (×3): 650 mg via ORAL
  Filled 2022-04-10 (×3): qty 2

## 2022-04-10 MED ORDER — LACTATED RINGERS IV SOLN: INTRAVENOUS | Status: DC

## 2022-04-10 MED ORDER — SENNOSIDES-DOCUSATE SODIUM 8.6-50 MG PO TABS: 1.0000 | ORAL_TABLET | Freq: Every evening | ORAL | Status: DC | PRN

## 2022-04-10 MED ORDER — ARIPIPRAZOLE 5 MG PO TABS
5.0000 mg | ORAL_TABLET | Freq: Every day | ORAL | Status: DC
  Administered 2022-04-10 – 2022-04-12 (×3): 5 mg via ORAL
  Filled 2022-04-10 (×3): qty 1

## 2022-04-10 MED ORDER — VANCOMYCIN HCL 750 MG/150ML IV SOLN
750.0000 mg | Freq: Two times a day (BID) | INTRAVENOUS | Status: DC
  Administered 2022-04-10 – 2022-04-11 (×4): 750 mg via INTRAVENOUS
  Filled 2022-04-10 (×5): qty 150

## 2022-04-10 MED ORDER — PROCHLORPERAZINE EDISYLATE 10 MG/2ML IJ SOLN: 10.0000 mg | Freq: Four times a day (QID) | INTRAMUSCULAR | Status: DC | PRN

## 2022-04-10 MED ORDER — LIDOCAINE HCL 2 % IJ SOLN
INTRAMUSCULAR | Status: AC
  Administered 2022-04-10: 400 mg
  Filled 2022-04-10: qty 20

## 2022-04-10 MED ORDER — ACETAMINOPHEN 650 MG RE SUPP: 650.0000 mg | Freq: Four times a day (QID) | RECTAL | Status: DC | PRN

## 2022-04-10 MED ORDER — OXYCODONE HCL 5 MG PO TABS
5.0000 mg | ORAL_TABLET | ORAL | Status: DC | PRN
  Administered 2022-04-10: 5 mg via ORAL
  Filled 2022-04-10: qty 1

## 2022-04-10 MED ORDER — SODIUM CHLORIDE 0.9 % IV SOLN
1.0000 g | Freq: Every day | INTRAVENOUS | Status: DC
  Administered 2022-04-10 – 2022-04-11 (×2): 1 g via INTRAVENOUS
  Filled 2022-04-10 (×3): qty 10

## 2022-04-10 MED ORDER — HYDROCODONE-ACETAMINOPHEN 5-325 MG PO TABS
1.0000 | ORAL_TABLET | ORAL | Status: DC | PRN
  Administered 2022-04-11: 1 via ORAL
  Filled 2022-04-10: qty 1

## 2022-04-10 MED ORDER — METRONIDAZOLE 500 MG/100ML IV SOLN
500.0000 mg | Freq: Two times a day (BID) | INTRAVENOUS | Status: DC
  Administered 2022-04-10 – 2022-04-11 (×4): 500 mg via INTRAVENOUS
  Filled 2022-04-10 (×4): qty 100

## 2022-04-10 NOTE — Progress Notes (Signed)
Writer completed the soaking of right hand I&D area. Packing, gauze, and light wrap applied.

## 2022-04-10 NOTE — Consult Note (Signed)
Orthopedic Hand Surgery Consultation:  Reason for Consult: Right hand cat bite Referring Physician: Dr. Maryfrances Bunnell   HPI: Alexis [name] is a(an) 26 y.o. female with right hand cat bite.  Patient was evaluated at bedside for right hand infection.  She had a cat bite 2 days ago developed worsening pain and swelling to the dorsal aspect of the right hand.  He works as a Agricultural engineer.  She does have a past history of tetralogy of Fallot and schizophrenia.   X-rays were negative for foreign body, soft tissue swelling present.   Physical exam of the right hand shows some tenderness over the dorsal aspect of the  right hand nontender proximal to the hand or within the digits.  No swelling or pain volarly.  She has some pain with active extension of the digits however no signs of an a sending tenosynovitis.  Incision intact light touch to the digits with brisk capillary refill.   Patient with a right hand cellulitis that is formed into an abscess dorsally from a cat bite.  My recommendation is for I&D culture.  Failed p.o. antibiotics a nd overnight IV antibiotics without improvement.   10 cc of lidocaine plain was utilized to anesthetize the area over the Puncture wound dorsally in line with the fourth ray.  After adequate anesthesia a longitudinal incision was made and hemostats were utilized to decompress the abscess.  Cultures were obtained and the wound was thoroughly irrigated with normal saline.  Packing was placed within the wound and wound was dressed.  S he tolerated the procedure well with no pain throughout.  She is able to perform full active and passive extension and flexion of the digits under local anesthesia.   Plan for IV antibiotics and will tailor the antibiotics appropriately.  Patient will follow-up with me in approximately 1 week.    Mathis Dad, MD Orthopaedic Hand Surgeon [facility] Office number: [phone] [address] [place], [place]  [address]    Past Medical History:  Diagnosis Date   Allergy    Auditory hallucination    Bipolar disorder [facility])    Deliberate self-cutting    GAD (generalized anxiety disorder)    Heart disease    Incomplete RBBB 01/2019   noted on EKG from [facility]   Leukopenia 01/27/2021   Migraines    Right ovarian cyst 02/20/2019   3.7 cm right ovarian cyst.   Schizoaffective disorder (HCC)    Suicidal ideation 02/12/2020   Tetralogy of Fallot     Past Surgical History:  Procedure Laterality Date   BIOPSY  03/08/2019   Procedure: BIOPSY;  Surgeon: Jeani Hawking, MD;  Location: [facility];  Service: Endoscopy;;   CARDIAC SURGERY     CARDIAC SURGERY     4 open heart surgeries   ESOPHAGOGASTRODUODENOSCOPY (EGD) WITH PROPOFOL N/A 03/08/2019   Procedure: ESOPHAGOGASTRODUODENOSCOPY (EGD) WITH PROPOFOL;  Surgeon: Jeani Hawking, MD;  Location: [facility] ENDOSCOPY;  Service: Endoscopy;  Laterality: N/A;   GASTROSTOMY W/ FEEDING TUBE     removed 1 year ago    THORACIC DUCT LIGATION      Family History  Problem Relation Age of Onset   Hypertension Mother    Healthy Father    Hypertension Maternal Grandmother    Diabetes Maternal Grandfather    Heart disease Maternal Grandfather    Stroke Maternal Grandfather    Kidney disease Paternal Grandfather    Heart attack Other    Stroke Other    Colon cancer Neg  Hx     Social History:  reports that she has never smoked. She has never used smokeless tobacco. She reports current alcohol use. She reports current drug use. Drug: Marijuana.  Allergies:  Allergies  Allergen Reactions   Dopamine     Makes WBC rise cardiac arrest   Peanut-Containing Drug Products     Hazel nuts [place] nuts pistachios    Medications: reviewed, no changes to patient's home medications  Results for orders placed or performed during the hospital encounter of 04/09/22 (from the past 48 hour(s))  CBC with Differential     Status: Abnormal   Collection Time: 04/09/22  5:26 PM   Result Value Ref Range   WBC 6.1 4.0 - 10.5 K/uL   RBC 4.22 3.87 - 5.11 MIL/uL   Hemoglobin 13.0 12.0 - 15.0 g/dL   HCT 16.1 09.6 - 04.5 %   MCV 93.1 80.0 - 100.0 fL   MCH 30.8 26.0 - 34.0 pg   MCHC 33.1 30.0 - 36.0 g/dL   RDW 40.9 81.1 - 91.4 %   Platelets 123 (L) 150 - 400 K/uL   nRBC 0.0 0.0 - 0.2 %   Neutrophils Relative % 78 %   Neutro Abs 4.8 1.7 - 7.7 K/uL   Lymphocytes Relative 12 %   Lymphs Abs 0.7 0.7 - 4.0 K/uL   Monocytes Relative 9 %   Monocytes Absolute 0.5 0.1 - 1.0 K/uL   Eosinophils Relative 0 %   Eosinophils Absolute 0.0 0.0 - 0.5 K/uL   Basophils Relative 1 %   Basophils Absolute 0.0 0.0 - 0.1 K/uL   Immature Granulocytes 0 %   Abs Immature Granulocytes 0.02 0.00 - 0.07 K/uL    Comment: Performed at [facility], [address], [place], [place] [address]  Basic metabolic panel     Status: Abnormal   Collection Time: 04/09/22  5:26 PM  Result Value Ref Range   Sodium 136 135 - 145 mmol/L   Potassium 3.8 3.5 - 5.1 mmol/L   Chloride 104 98 - 111 mmol/L   CO2 23 22 - 32 mmol/L   Glucose, Bld 126 (H) 70 - 99 mg/dL    Comment: Glucose reference range applies only to samples taken after fasting for at least 8 hours.   BUN 11 6 - 20 mg/dL   Creatinine, Ser 6.21 0.44 - 1.00 mg/dL   Calcium 9.6 8.9 - 30.8 mg/dL   GFR, Estimated >65 >78 mL/min    Comment: (NOTE) Calculated using the CKD-EPI Creatinine Equation (2021)    Anion gap 9 5 - 15    Comment: Performed at [facility], [address] [place], [place] [address]  Sedimentation rate     Status: None   Collection Time: 04/09/22  5:26 PM  Result Value Ref Range   Sed Rate 3 0 - 22 mm/hr    Comment: Performed at [facility], [address], [place], [place] [address]  Pregnancy, urine     Status: None   Collection Time: 04/09/22  5:26 PM  Result Value Ref Range   Preg Test, Ur NEGATIVE NEGATIVE    Comment:        THE SENSITIVITY OF  THIS METHODOLOGY IS >20 mIU/mL. Performed at [facility], [address] [place], [place] [address]   Lactic acid, plasma     Status: None   Collection Time: 04/09/22  5:26 PM  Result Value Ref Range   Lactic Acid, Venous 0.7 0.5 -  1.9 mmol/L    Comment: Performed at [facility], [address] [place], [place] [address]  C-reactive protein     Status: Abnormal   Collection Time: 04/09/22  6:50 PM  Result Value Ref Range   CRP 4.0 (H) <1.0 mg/dL    Comment: Performed at [facility], 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 EXAM: RIGHT HAND - COMPLETE 3+ VIEW COMPARISON:  None Available. FINDINGS: There is no evidence of fracture or dislocation. There is no evidence of arthropathy or other focal bone abnormality. Soft tissues swelling about the dorsum of the hand. No radiopaque foreign body. IMPRESSION: Soft tissue swelling about the dorsum of the hand. No radiopaque foreign body. No acute osseous abnormality Electronically Signed   By: Larose Hires D.O.   On: 04/09/2022 15:25    ROS: 14 point review of systems negative except per HPI

## 2022-04-10 NOTE — Assessment & Plan Note (Signed)
Cannot rule out tenosynovitis. Pt having pain and difficulty with extension of right 3rd, 4th and 5th digits. Will need hand surgery to evaluate her. Pt is right hand dominant.

## 2022-04-10 NOTE — Hospital Course (Signed)
Mrs. Alexis [name] is a 26 y.o. F with hx ToF, MDD and Bipolar who presented with hand swelling.  Works as Museum/gallery conservator.  Recently bitten by cat.  Was started on Augmentin and has been taking but started to have increased swelling.  In the ER, X-ray negative.  Started on Zosyn.  Hand surgery consulted.

## 2022-04-10 NOTE — Subjective & Objective (Signed)
CC: right hand cellulitis HPI: 26 year old African-American female history of tetralogy of Fallot status postrepair who was bit by a cat on her dorsum of her right hand this past Wednesday.  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  She was immediately started on Augmentin the days she was bitten.  She is noted increasing pain and swelling of the dorsum of her right hand.  She is lost the ability to extend her third fourth and fifth digits on her right hand due to pain.  She states that she has seen pus coming out of the puncture wound.  She went to the ER for evaluation.  Her laboratory evaluation was pretty much normal except for an elevated CRP of 4, glucose of 126, and chronic thrombocytopenia of 123.  X-rays of the right hand showed soft tissue swelling of the dorsum of the hand but no radiopaque foreign bodies.  Case was discussed with Dr. Yehuda Budd with hand surgery who recommended IV antibiotics.  They were formally consulted.  Patient started on IV Zosyn as she has been taking several days of Augmentin with failure to improve.  Patient transferred to the hospital for further care.

## 2022-04-10 NOTE — Assessment & Plan Note (Signed)
Admit to med/surg bed. Continue with IV zosyn. Keep NPO until seen by hand surgery. Po oxycodone for pain.

## 2022-04-10 NOTE — Progress Notes (Signed)
Pharmacy Antibiotic Note  Alexis [name] is a 26 y.o. female admitted on 04/09/2022 with cellulitis of hand s/p cat bite.  Pharmacy has been consulted for Zosyn dosing.  Plan: Zosyn 3.375g IV q8h (4 hour infusion). Need for further dosage adjustment appears unlikely at present.    Will sign off at this time.  Please reconsult if a change in clinical status warrants re-evaluation of dosage.   Height: 5\' 5"  (165.1 cm) Weight: 55.6 kg (122 lb 9.6 oz) IBW/kg (Calculated) : 57  Temp (24hrs), Avg:98.4 F (36.9 C), Min:98.1 F (36.7 C), Max:98.6 F (37 C)  Recent Labs  Lab 04/09/22 1726 04/09/22 1858  WBC 6.1  --   CREATININE 0.72  --   LATICACIDVEN 0.7 1.3    Estimated Creatinine Clearance: 94.4 mL/min (by C-G formula based on SCr of 0.72 mg/dL).    Allergies  Allergen Reactions   Dopamine     Makes WBC rise cardiac arrest   Peanut-Containing Drug Products     Hazel nuts 04/11/22 nuts pistachios     Thank you for allowing pharmacy to be a part of this patient's care.  [place], PharmD 04/10/2022 6:32 AM

## 2022-04-10 NOTE — Assessment & Plan Note (Signed)
Continue Abilify 5 mg daily

## 2022-04-10 NOTE — Progress Notes (Addendum)
Pharmacy Antibiotic Note  Alexis [name] is a 26 y.o. female admitted on 04/09/2022 with cellulitis.  Pharmacy has been consulted for vanc dosing. Zosyn changed to Rocephin per Md  Plan: Vanc 750mg  IV q12 - goal AUC 400-550   Height: 5\' 5"  (165.1 cm) Weight: 55.6 kg (122 lb 9.6 oz) IBW/kg (Calculated) : 57  Temp (24hrs), Avg:98.4 F (36.9 C), Min:98.1 F (36.7 C), Max:98.6 F (37 C)  Recent Labs  Lab 04/09/22 1726 04/09/22 1858  WBC 6.1  --   CREATININE 0.72  --   LATICACIDVEN 0.7 1.3    Estimated Creatinine Clearance: 94.4 mL/min (by C-G formula based on SCr of 0.72 mg/dL).    Allergies  Allergen Reactions   Dopamine     Makes WBC rise cardiac arrest   Peanut-Containing Drug Products     Hazel nuts 04/11/22 nuts pistachios     Thank you for allowing pharmacy to be a part of this patient's care.  04/11/22 04/10/2022 8:31 AM

## 2022-04-10 NOTE — Progress Notes (Signed)
Patient was evaluated at bedside for right hand infection.  She had a cat bite 2 days ago developed worsening pain and swelling to the dorsal aspect of the right hand.  He works as a Agricultural engineer.  She does have a past history of tetralogy of Fallot and schizophrenia.  X-rays were negative for foreign body, soft tissue swelling present.  Physical exam of the right hand shows some tenderness over the dorsal aspect of the  right hand nontender proximal to the hand or within the digits.  No swelling or pain volarly.  She has some pain with active extension of the digits however no signs of an a sending tenosynovitis.  Incision intact light touch to the digits with brisk capillary refill.  Patient with a right hand cellulitis that is formed into an abscess dorsally from a cat bite.  My recommendation is for I&D culture.  Failed p.o. antibiotics a nd overnight IV antibiotics without improvement.  10 cc of lidocaine plain was utilized to anesthetize the area over the Puncture wound dorsally in line with the fourth ray.  After adequate anesthesia a longitudinal incision was made and hemostats were utilized to decompress the abscess.  Cultures were obtained and the wound was thoroughly irrigated with normal saline.  Packing was placed within the wound and wound was dressed.  S he tolerated the procedure well with no pain throughout.  She is able to perform full active and passive extension and flexion of the digits under local anesthesia.  Plan for IV antibiotics and will tailor the antibiotics appropriately.  Patient will follow-up with me in approximately 1 week.

## 2022-04-10 NOTE — Progress Notes (Signed)
  Progress Note   Patient: Alexis [name] WGN:[id number] DOB: 01-Sep-1995 DOA: 04/09/2022     0 DOS: the patient was seen and examined on 04/10/2022        Brief hospital course: Alexis [name] is a 26 y.o. F with hx ToF, MDD and Bipolar who presented with hand swelling.  Works as Museum/gallery conservator.  Recently bitten by cat.  Was started on Augmentin and has been taking but started to have increased swelling.  In the ER, X-ray negative.  Started on Zosyn.  Hand surgery consulted.     Assessment and Plan: * Cellulitis of right hand due to Cat bite  History of tetralogy of Fallot  MDD (major depressive disorder)   This is a no charge note, for further details, please see the H&P by my partner Dr. Imogene Burn from earlier today.  Cat bite, not responding to treatment.  Underwent I&D by Ortho earlier today, reportedly not much pus, although Ortho suspect this didn't respond to oral abx because it was an abscess.  Failed augmentin and PsA not a cat bite pathogen, so will change to Rocephin Flagyl and add vancomycin for MRSA coverage  Follow I&D culture               Physical Exam: BP (!) 116/48 (BP Location: Left Arm)   Pulse 81   Temp 98.3 F (36.8 C) (Oral)   Resp 18   Ht 5\' 5"  (1.651 m)   Wt 55.6 kg   SpO2 100%   BMI 20.40 kg/m     Data Reviewed:   Family Communication:     Disposition: Status is: Inpatient         Author: , MD 04/10/2022 4:48 PM  For on call review [email or web address].

## 2022-04-10 NOTE — Assessment & Plan Note (Signed)
Seen early this year by her pediatric cardiologist. Her cardiac function is normal. Per their note, pt does not require prophylactic antibiotics for dental procedures.

## 2022-04-10 NOTE — H&P (Signed)
History and Physical    Alexis [name] NTI:[id number] DOB: 22-Sep-1995 DOA: 04/09/2022  DOS: the patient was seen and examined on 04/09/2022  PCP: Jake Shark, PA-C   Patient coming from: Home  I have personally briefly reviewed patient's old medical records in [facility]  CC: right hand cellulitis HPI: 26 year old African-American female history of tetralogy of Fallot status postrepair who was bit by a cat on her dorsum of her right hand this past Wednesday.  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  She was immediately started on Augmentin the days she was bitten.  She is noted increasing pain and swelling of the dorsum of her right hand.  She is lost the ability to extend her third fourth and fifth digits on her right hand due to pain.  She states that she has seen pus coming out of the puncture wound.  She went to the ER for evaluation.  Her laboratory evaluation was pretty much normal except for an elevated CRP of 4, glucose of 126, and chronic thrombocytopenia of 123.  X-rays of the right hand showed soft tissue swelling of the dorsum of the hand but no radiopaque foreign bodies.  Case was discussed with Dr. Yehuda Budd with hand surgery who recommended IV antibiotics.  They were formally consulted.  Patient started on IV Zosyn as she has been taking several days of Augmentin with failure to improve.  Patient transferred to the hospital for further care.   ED Course: xray negative for foreign body  Review of Systems:  Review of Systems  Constitutional: Negative.   HENT: Negative.    Eyes: Negative.   Respiratory: Negative.    Cardiovascular: Negative.   Gastrointestinal: Negative.   Genitourinary:  Positive for dysuria.  Musculoskeletal:        Increasing pain with extension of her third fourth and fifth digits of her right hand.  Pus oozing from the dorsum of the right hand puncture site.  Skin:  Negative.   Neurological: Negative.   Endo/Heme/Allergies: Negative.   Psychiatric/Behavioral: Negative.    All other systems reviewed and are negative.   Past Medical History:  Diagnosis Date   Allergy    Auditory hallucination    Bipolar disorder (HCC)    Deliberate self-cutting    GAD (generalized anxiety disorder)    Heart disease    Incomplete RBBB 01/2019   noted on EKG from [facility]   Leukopenia 01/27/2021   Migraines    Right ovarian cyst 02/20/2019   3.7 cm right ovarian cyst.   Schizoaffective disorder (HCC)    Suicidal ideation 02/12/2020   Tetralogy of Fallot     Past Surgical History:  Procedure Laterality Date   BIOPSY  03/08/2019   Procedure: BIOPSY;  Surgeon: Jeani Hawking, MD;  Location: [facility];  Service: Endoscopy;;   CARDIAC SURGERY     CARDIAC SURGERY     4 open heart surgeries   ESOPHAGOGASTRODUODENOSCOPY (EGD) WITH PROPOFOL N/A 03/08/2019   Procedure: ESOPHAGOGASTRODUODENOSCOPY (EGD) WITH PROPOFOL;  Surgeon: Jeani Hawking, MD;  Location: [facility];  Service: Endoscopy;  Laterality: N/A;   GASTROSTOMY W/ FEEDING TUBE     removed 1 year ago    THORACIC DUCT LIGATION       reports that she has never smoked. She has never used smokeless tobacco. She reports current alcohol use. She reports current drug use. Drug: Marijuana.  Allergies  Allergen Reactions   Dopamine  Makes WBC rise cardiac arrest   Peanut-Containing Drug Products     Hazel nuts [place] nuts pistachios    Family History  Problem Relation Age of Onset   Hypertension Mother    Healthy Father    Hypertension Maternal Grandmother    Diabetes Maternal Grandfather    Heart disease Maternal Grandfather    Stroke Maternal Grandfather    Kidney disease Paternal Grandfather    Heart attack Other    Stroke Other    Colon cancer Neg Hx     Prior to Admission medications   Medication Sig Start Date End Date Taking? Authorizing Provider  albuterol (VENTOLIN HFA) 108 (90  Base) MCG/ACT inhaler Inhale 2 puffs into the lungs every 6 (six) hours as needed. 10/13/21   [provider]    Physical Exam: Vitals:   04/10/22 0045 04/10/22 0300 04/10/22 0400 04/10/22 0507  BP: (!) 110/55 110/67  126/62  Pulse: 61 (!) 57 63 66  Resp: 16 16  15   Temp:    98.1 F (36.7 C)  TempSrc:    Oral  SpO2: 98% 100% 100% 100%  Weight:      Height:        Physical Exam Vitals and nursing note reviewed.  Constitutional:      General: She is not in acute distress.    Appearance: Normal appearance. She is normal weight. She is not ill-appearing, toxic-appearing or diaphoretic.  HENT:     Head: Normocephalic and atraumatic.     Nose: Nose normal.  Cardiovascular:     Rate and Rhythm: Normal rate and regular rhythm.     Pulses: Normal pulses.     Heart sounds: Murmur heard.  Pulmonary:     Effort: Pulmonary effort is normal. No respiratory distress.     Breath sounds: No wheezing or rales.  Abdominal:     General: Abdomen is flat. Bowel sounds are normal. There is no distension.     Palpations: Abdomen is soft.     Tenderness: There is no abdominal tenderness. There is no guarding or rebound.  Musculoskeletal:     Right hand: Swelling and tenderness present.       Hands:     Right lower leg: No edema.     Left lower leg: No edema.  Skin:    Capillary Refill: Capillary refill takes less than 2 seconds.     Comments: Edema over dorsum of right hand. Unable to extend 3rd, 4th, 5th digits on her right hand from a position of flexion.  See picture  Neurological:     General: No focal deficit present.     Mental Status: She is alert and oriented to person, place, and time.      Labs on Admission: I have personally reviewed following labs and imaging studies  CBC: Recent Labs  Lab 04/09/22 1726  WBC 6.1  NEUTROABS 4.8  HGB 13.0  HCT 39.3  MCV 93.1  PLT 123*   Basic Metabolic Panel: Recent Labs  Lab 04/09/22 1726  NA 136  K 3.8  CL 104  CO2  23  GLUCOSE 126*  BUN 11  CREATININE 0.72  CALCIUM 9.6   GFR: Estimated Creatinine Clearance: 94.4 mL/min (by C-G formula based on SCr of 0.72 mg/dL). Liver Function Tests: No results for input(s): "AST", "ALT", "ALKPHOS", "BILITOT", "PROT", "ALBUMIN" in the last 168 hours. No results for input(s): "LIPASE", "AMYLASE" in the last 168 hours. No results for input(s): "AMMONIA" in the last 168  hours. Coagulation Profile: No results for input(s): "INR", "PROTIME" in the last 168 hours. Cardiac Enzymes: No results for input(s): "CKTOTAL", "CKMB", "CKMBINDEX", "TROPONINI", "TROPONINIHS" in the last 168 hours. BNP (last 3 results) No results for input(s): "PROBNP" in the last 8760 hours. HbA1C: No results for input(s): "HGBA1C" in the last 72 hours. CBG: No results for input(s): "GLUCAP" in the last 168 hours. Lipid Profile: No results for input(s): "CHOL", "HDL", "LDLCALC", "TRIG", "CHOLHDL", "LDLDIRECT" in the last 72 hours. Thyroid Function Tests: No results for input(s): "TSH", "T4TOTAL", "FREET4", "T3FREE", "THYROIDAB" in the last 72 hours. Anemia Panel: No results for input(s): "VITAMINB12", "FOLATE", "FERRITIN", "TIBC", "IRON", "RETICCTPCT" in the last 72 hours. Urine analysis:    Component Value Date/Time   COLORURINE YELLOW (A) 02/19/2019 2105   APPEARANCEUR TURBID (A) 02/19/2019 2105   LABSPEC 1.030 11/19/2020 1013   PHURINE 5.0 11/13/2020 1708   GLUCOSEU NEGATIVE 11/13/2020 1708   HGBUR NEGATIVE 11/13/2020 1708   BILIRUBINUR small (A) 11/19/2020 1013   KETONESUR trace (5) (A) 11/19/2020 1013   KETONESUR 40 (A) 11/13/2020 1708   PROTEINUR trace (A) 11/19/2020 1013   PROTEINUR 30 (A) 11/13/2020 1708   UROBILINOGEN 0.2 11/13/2020 1708   NITRITE Negative 11/19/2020 1013   NITRITE NEGATIVE 11/13/2020 1708   LEUKOCYTESUR Negative 11/19/2020 1013   LEUKOCYTESUR NEGATIVE 11/13/2020 1708    Radiological Exams on Admission: I have personally reviewed images DG Hand  Complete Right  Result Date: 04/09/2022 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 EXAM: RIGHT HAND - COMPLETE 3+ VIEW COMPARISON:  None Available. FINDINGS: There is no evidence of fracture or dislocation. There is no evidence of arthropathy or other focal bone abnormality. Soft tissues swelling about the dorsum of the hand. No radiopaque foreign body. IMPRESSION: Soft tissue swelling about the dorsum of the hand. No radiopaque foreign body. No acute osseous abnormality Electronically Signed   By: Larose Hires D.O.   On: 04/09/2022 15:25    EKG: My personal interpretation of EKG shows: no EKG to review  Assessment/Plan Principal Problem:   Cellulitis of right hand Active Problems:   Tenosynovitis of right hand   History of tetralogy of Fallot    Assessment and Plan: * Cellulitis of right hand Admit to med/surg bed. Continue with IV zosyn. Keep NPO until seen by hand surgery. Po oxycodone for pain.  Tenosynovitis of right hand Cannot rule out tenosynovitis. Pt having pain and difficulty with extension of right 3rd, 4th and 5th digits. Will need hand surgery to evaluate her. Pt is right hand dominant.  History of tetralogy of Fallot Seen early this year by her pediatric cardiologist. Her cardiac function is normal. Per their note, pt does not require prophylactic antibiotics for dental procedures.    DVT prophylaxis: SCDs Code Status: Full Code Family Communication: discussed with pt and her fiance [name]  Disposition Plan: return home  Consults called: EDP has consulted hand surgery Dr. Yehuda Budd  Admission status: Inpatient, Med-Surg   Carollee Herter, DO [facility] 04/10/2022, 6:14 AM

## 2022-04-11 DIAGNOSIS — D696 Thrombocytopenia, unspecified: Secondary | ICD-10-CM

## 2022-04-11 DIAGNOSIS — L03113 Cellulitis of right upper limb: Secondary | ICD-10-CM | POA: Diagnosis not present

## 2022-04-11 DIAGNOSIS — Z8659 Personal history of other mental and behavioral disorders: Secondary | ICD-10-CM | POA: Diagnosis not present

## 2022-04-11 DIAGNOSIS — Z8774 Personal history of (corrected) congenital malformations of heart and circulatory system: Secondary | ICD-10-CM | POA: Diagnosis not present

## 2022-04-11 LAB — BASIC METABOLIC PANEL
Anion gap: 9 (ref 5–15)
BUN: 14 mg/dL (ref 6–20)
CO2: 23 mmol/L (ref 22–32)
Calcium: 9.6 mg/dL (ref 8.9–10.3)
Chloride: 108 mmol/L (ref 98–111)
Creatinine, Ser: 0.69 mg/dL (ref 0.44–1.00)
GFR, Estimated: >60 mL/min (ref >60)
Glucose, Bld: 94 mg/dL (ref 70–99)
Potassium: 4.3 mmol/L (ref 3.5–5.1)
Sodium: 140 mmol/L (ref 135–145)

## 2022-04-11 LAB — CBC
HCT: 41.8 % (ref 36.0–46.0)
Hemoglobin: 13.5 g/dL (ref 12.0–15.0)
MCH: 30.5 pg (ref 26.0–34.0)
MCHC: 32.3 g/dL (ref 30.0–36.0)
MCV: 94.4 fL (ref 80.0–100.0)
Platelets: 130 10*3/uL — ABNORMAL LOW (ref 150–400)
RBC: 4.43 MIL/uL (ref 3.87–5.11)
RDW: 12.6 % (ref 11.5–15.5)
WBC: 3.9 10*3/uL — ABNORMAL LOW (ref 4.0–10.5)
nRBC: 0 % (ref 0.0–0.2)

## 2022-04-11 NOTE — Progress Notes (Signed)
PROGRESS NOTE  Alexis [name] UUV:[id number] DOB: 05/28/1995   PCP: Jake Shark, PA-C  Patient is from: Home.  DOA: 04/09/2022 LOS: 1  Chief complaints Chief Complaint  Patient presents with   Animal Bite     Brief Narrative / Interim history: 26 y.o. 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 Augmentin.  Patient reports tetanus vaccine about a year ago.  Per patient, the cat was fully vaccinated but missed its rabies vaccine this year on 04/04/2022 due to illness.  Patient is hemodynamically stable.  No fever or leukocytosis.  X-ray negative.  Blood cultures obtained.  Started on IV Zosyn.  Underwent bedside I&D on 11/26.  Tissue culture sent.  Antibiotic de-escalated to ceftriaxone, vancomycin and Flagyl.  Blood cultures NGTD.  Improving.  Subjective: Seen and examined earlier this morning.  No major events overnight of this morning.  Reports improvement in pain and swelling.  Rates her pain 3/10 when not moving and 8/10 with flexion and extension.  Denies numbness or tingling.  No other symptoms.  Patient's mother at bedside.  Objective: Vitals:   04/10/22 1342 04/10/22 1659 04/10/22 1939 04/11/22 0500  BP:  113/68 (!) 99/53 100/60  Pulse:  85 69 68  Resp:  20 16 16   Temp: 98.3 F (36.8 C) 98.6 F (37 C) 98.2 F (36.8 C) 98.2 F (36.8 C)  TempSrc: Oral Oral Oral Oral  SpO2:  100% 100% 98%  Weight:      Height:        Examination:  GENERAL: No apparent distress.  Nontoxic. HEENT: MMM.  Vision and hearing grossly intact.  NECK: Supple.  No apparent JVD.  RESP:  No IWOB.  Fair aeration bilaterally. CVS:  RRR.  Loud heart murmur all over.  Heart sounds normal.  ABD/GI/GU: BS+. Abd soft, NTND.  MSK/EXT:  Moves extremities.  Puncture wound over dorsal aspect of right hand.  Some swelling.  Able to make fist and extend.  No erythema or  drainage. SKIN: As above. NEURO: Awake, alert and oriented appropriately.  No apparent focal neuro deficit. PSYCH: Calm. Normal affect.   Procedures:  11/25-I&D  Microbiology summarized: Blood cultures NGTD I&D culture pending  Assessment and plan: Principal Problem:   Cellulitis of right hand Active Problems:   MDD (major depressive disorder)   History of tetralogy of Fallot   Tenosynovitis of right hand  Right hand cellulitis due to cat bite- Patient reports tetanus vaccine about a year ago.  Per patient, the cat was fully vaccinated but missed its rabies vaccine this year on 04/04/2022 due to illness -S/p I&D at bedside.  Blood cultures NGTD.  Surgical culture pending. -Continue ceftriaxone, Flagyl and vancomycin pending tissue culture -Continue wound care -Appreciate input by Ortho  History of Tetralogy of Fallot-blood cultures negative.  History of depression/BPD: Stable -Continue home meds  Thrombocytopenia: Likely due to infection.  Improving. -Monitor  Body mass index is 20.4 kg/m.           DVT prophylaxis:  SCDs Start: 04/10/22 0800  Code Status: Full code Family Communication: Updated patient's mother at bedside Level of care: Med-Surg Status is: Inpatient Remains inpatient appropriate because: Right hand cellulitis due to cat bite requiring IV antibiotics pending cultures   Final disposition: Home Consultants:  Orthopedic surgery  Sch Meds:  Scheduled Meds:  ARIPiprazole  5  mg Oral Daily   Continuous Infusions:  cefTRIAXone (ROCEPHIN)  IV 1 g (04/11/22 4008)   metronidazole 500 mg (04/10/22 2340)   vancomycin 750 mg (04/11/22 1030)   PRN Meds:.acetaminophen **OR** acetaminophen, HYDROcodone-acetaminophen, morphine injection, ondansetron **OR** ondansetron (ZOFRAN) IV, prochlorperazine, senna-docusate  Antimicrobials: Anti-infectives (From admission, onward)    Start     Dose/Rate Route Frequency Ordered Stop   04/10/22 1000   vancomycin (VANCOREADY) IVPB 750 mg/150 mL        750 mg 150 mL/hr over 60 Minutes Intravenous Every 12 hours 04/10/22 0833     04/10/22 0900  vancomycin (VANCOCIN) IVPB 1000 mg/200 mL premix  Status:  Discontinued        1,000 mg 200 mL/hr over 60 Minutes Intravenous  Once 04/10/22 0807 04/10/22 0833   04/10/22 0830  cefTRIAXone (ROCEPHIN) 1 g in sodium chloride 0.9 % 100 mL IVPB        1 g 200 mL/hr over 30 Minutes Intravenous Daily 04/10/22 0807 04/17/22 0959   04/10/22 0830  metroNIDAZOLE (FLAGYL) IVPB 500 mg        500 mg 100 mL/hr over 60 Minutes Intravenous 2 times daily 04/10/22 0807     04/10/22 0700  piperacillin-tazobactam (ZOSYN) IVPB 3.375 g  Status:  Discontinued        3.375 g 12.5 mL/hr over 240 Minutes Intravenous Every 8 hours 04/10/22 0631 04/10/22 0807   04/09/22 1745  piperacillin-tazobactam (ZOSYN) IVPB 3.375 g        3.375 g 100 mL/hr over 30 Minutes Intravenous  Once 04/09/22 1732 04/09/22 1858   04/09/22 1730  piperacillin-tazobactam (ZOSYN) IVPB 4.5 g  Status:  Discontinued       Note to Pharmacy: Cat bite infection ,weight 20.40   4.5 g 200 mL/hr over 30 Minutes Intravenous  Once 04/09/22 1726 04/09/22 1732        I have personally reviewed the following labs and images: CBC: Recent Labs  Lab 04/09/22 1726 04/11/22 0533  WBC 6.1 3.9*  NEUTROABS 4.8  --   HGB 13.0 13.5  HCT 39.3 41.8  MCV 93.1 94.4  PLT 123* 130*   BMP &GFR Recent Labs  Lab 04/09/22 1726 04/11/22 0533  NA 136 140  K 3.8 4.3  CL 104 108  CO2 23 23  GLUCOSE 126* 94  BUN 11 14  CREATININE 0.72 0.69  CALCIUM 9.6 9.6   Estimated Creatinine Clearance: 94.4 mL/min (by C-G formula based on SCr of 0.69 mg/dL). Liver & Pancreas: No results for input(s): "AST", "ALT", "ALKPHOS", "BILITOT", "PROT", "ALBUMIN" in the last 168 hours. No results for input(s): "LIPASE", "AMYLASE" in the last 168 hours. No results for input(s): "AMMONIA" in the last 168 hours. Diabetic: No results  for input(s): "HGBA1C" in the last 72 hours. No results for input(s): "GLUCAP" in the last 168 hours. Cardiac Enzymes: No results for input(s): "CKTOTAL", "CKMB", "CKMBINDEX", "TROPONINI" in the last 168 hours. No results for input(s): "PROBNP" in the last 8760 hours. Coagulation Profile: No results for input(s): "INR", "PROTIME" in the last 168 hours. Thyroid Function Tests: No results for input(s): "TSH", "T4TOTAL", "FREET4", "T3FREE", "THYROIDAB" in the last 72 hours. Lipid Profile: No results for input(s): "CHOL", "HDL", "LDLCALC", "TRIG", "CHOLHDL", "LDLDIRECT" in the last 72 hours. Anemia Panel: No results for input(s): "VITAMINB12", "FOLATE", "FERRITIN", "TIBC", "IRON", "RETICCTPCT" in the last 72 hours. Urine analysis:    Component Value Date/Time   COLORURINE YELLOW (A) 02/19/2019 2105   APPEARANCEUR TURBID (A) 02/19/2019 2105  LABSPEC 1.030 11/19/2020 1013   PHURINE 5.0 11/13/2020 1708   GLUCOSEU NEGATIVE 11/13/2020 1708   HGBUR NEGATIVE 11/13/2020 1708   BILIRUBINUR small (A) 11/19/2020 1013   KETONESUR trace (5) (A) 11/19/2020 1013   KETONESUR 40 (A) 11/13/2020 1708   PROTEINUR trace (A) 11/19/2020 1013   PROTEINUR 30 (A) 11/13/2020 1708   UROBILINOGEN 0.2 11/13/2020 1708   NITRITE Negative 11/19/2020 1013   NITRITE NEGATIVE 11/13/2020 1708   LEUKOCYTESUR Negative 11/19/2020 1013   LEUKOCYTESUR NEGATIVE 11/13/2020 1708   Sepsis Labs: Invalid input(s): "PROCALCITONIN", "LACTICIDVEN"  Microbiology: Recent Results (from the past 240 hour(s))  Blood culture (routine x 2)     Status: None (Preliminary result)   Collection Time: 04/09/22  5:25 PM   Specimen: BLOOD LEFT FOREARM  Result Value Ref Range Status   Specimen Description   Final    BLOOD LEFT FOREARM Performed at [facility], [address] [place], [place] [address]    Special Requests   Final    BOTTLES DRAWN AEROBIC ONLY Blood Culture adequate volume Performed at [facility], [address] [place], [place] [address]    Culture   Final    NO GROWTH < 24 HOURS Performed at [facility], [address], [place], [place] [address]    Report Status PENDING  Incomplete  Blood culture (routine x 2)     Status: None (Preliminary result)   Collection Time: 04/09/22  5:26 PM   Specimen: BLOOD  Result Value Ref Range Status   Specimen Description   Final    BLOOD LEFT ANTECUBITAL Performed at [facility], [address] [place], [place] [address]    Special Requests   Final    BOTTLES DRAWN AEROBIC AND ANAEROBIC Blood Culture adequate volume Performed at [facility], [address], [place], [place] [address]    Culture   Final    NO GROWTH < 24 HOURS Performed at [facility], [address], [place], [place] [address]    Report Status PENDING  Incomplete  Aerobic Culture w Gram Stain (superficial specimen)     Status: None (Preliminary result)   Collection Time: 04/10/22  9:15 AM   Specimen: Hand  Result Value Ref Range Status   Specimen Description   Final    HAND Performed at [facility], [address], [place], [place] [address]    Special Requests   Final    NONE Performed at [facility], [address] [place], [place] [address]    Gram Stain   Final    NO ORGANISMS SEEN NO WBC SEEN Performed at [facility], [address], [place], [place] [address]    Culture PENDING  Incomplete   Report Status PENDING  Incomplete    Radiology Studies: No results found.    Karcyn Menn T. Terrah Decoster [facility]  If 7PM-7AM, please contact night-coverage [email or web address] 04/11/2022, 12:12 PM

## 2022-04-11 NOTE — Plan of Care (Signed)

## 2022-04-12 ENCOUNTER — Other Ambulatory Visit (HOSPITAL_COMMUNITY): Payer: Self-pay

## 2022-04-12 DIAGNOSIS — L03113 Cellulitis of right upper limb: Secondary | ICD-10-CM | POA: Diagnosis not present

## 2022-04-12 DIAGNOSIS — M659 Synovitis and tenosynovitis, unspecified: Secondary | ICD-10-CM | POA: Diagnosis not present

## 2022-04-12 DIAGNOSIS — F329 Major depressive disorder, single episode, unspecified: Secondary | ICD-10-CM | POA: Diagnosis not present

## 2022-04-12 DIAGNOSIS — S61451A Open bite of right hand, initial encounter: Secondary | ICD-10-CM

## 2022-04-12 DIAGNOSIS — W5501XA Bitten by cat, initial encounter: Secondary | ICD-10-CM

## 2022-04-12 DIAGNOSIS — Z8774 Personal history of (corrected) congenital malformations of heart and circulatory system: Secondary | ICD-10-CM | POA: Diagnosis not present

## 2022-04-12 LAB — CBC WITH DIFFERENTIAL/PLATELET
Abs Immature Granulocytes: 0.01 10*3/uL (ref 0.00–0.07)
Basophils Absolute: 0 10*3/uL (ref 0.0–0.1)
Basophils Relative: 1 %
Eosinophils Absolute: 0.1 10*3/uL (ref 0.0–0.5)
Eosinophils Relative: 3 %
HCT: 38.6 % (ref 36.0–46.0)
Hemoglobin: 12.4 g/dL (ref 12.0–15.0)
Immature Granulocytes: 0 %
Lymphocytes Relative: 38 %
Lymphs Abs: 1.3 10*3/uL (ref 0.7–4.0)
MCH: 30.7 pg (ref 26.0–34.0)
MCHC: 32.1 g/dL (ref 30.0–36.0)
MCV: 95.5 fL (ref 80.0–100.0)
Monocytes Absolute: 0.4 10*3/uL (ref 0.1–1.0)
Monocytes Relative: 12 %
Neutro Abs: 1.5 10*3/uL — ABNORMAL LOW (ref 1.7–7.7)
Neutrophils Relative %: 46 %
Platelets: 132 10*3/uL — ABNORMAL LOW (ref 150–400)
RBC: 4.04 MIL/uL (ref 3.87–5.11)
RDW: 12.6 % (ref 11.5–15.5)
WBC: 3.3 10*3/uL — ABNORMAL LOW (ref 4.0–10.5)
nRBC: 0 % (ref 0.0–0.2)

## 2022-04-12 LAB — RENAL FUNCTION PANEL
Albumin: 3.7 g/dL (ref 3.5–5.0)
Anion gap: 8 (ref 5–15)
BUN: 12 mg/dL (ref 6–20)
CO2: 21 mmol/L — ABNORMAL LOW (ref 22–32)
Calcium: 9.1 mg/dL (ref 8.9–10.3)
Chloride: 110 mmol/L (ref 98–111)
Creatinine, Ser: 0.67 mg/dL (ref 0.44–1.00)
GFR, Estimated: >60 mL/min (ref >60)
Glucose, Bld: 98 mg/dL (ref 70–99)
Phosphorus: 4.7 mg/dL — ABNORMAL HIGH (ref 2.5–4.6)
Potassium: 4.1 mmol/L (ref 3.5–5.1)
Sodium: 139 mmol/L (ref 135–145)

## 2022-04-12 LAB — HIV ANTIBODY (ROUTINE TESTING W REFLEX): HIV Screen 4th Generation wRfx: NONREACTIVE

## 2022-04-12 LAB — MRSA NEXT GEN BY PCR, NASAL: MRSA by PCR Next Gen: NOT DETECTED

## 2022-04-12 LAB — MAGNESIUM: Magnesium: 2.1 mg/dL (ref 1.7–2.4)

## 2022-04-12 MED ORDER — DOXYCYCLINE HYCLATE 100 MG PO TABS
100.0000 mg | ORAL_TABLET | Freq: Two times a day (BID) | ORAL | Status: DC
  Administered 2022-04-12: 100 mg via ORAL
  Filled 2022-04-12: qty 1

## 2022-04-12 MED ORDER — ACETAMINOPHEN 325 MG PO TABS: ORAL_TABLET | ORAL | 0 refills | Status: AC

## 2022-04-12 MED ORDER — AMOXICILLIN-POT CLAVULANATE 875-125 MG PO TABS
1.0000 | ORAL_TABLET | Freq: Two times a day (BID) | ORAL | 0 refills | Status: AC
  Filled 2022-04-12: qty 36, 18d supply, fill #0

## 2022-04-12 MED ORDER — DOXYCYCLINE HYCLATE 100 MG PO CAPS
100.0000 mg | ORAL_CAPSULE | Freq: Two times a day (BID) | ORAL | 0 refills | Status: AC
  Filled 2022-04-12: qty 36, 18d supply, fill #0

## 2022-04-12 MED ORDER — AMOXICILLIN-POT CLAVULANATE 875-125 MG PO TABS
1.0000 | ORAL_TABLET | Freq: Two times a day (BID) | ORAL | Status: DC
  Administered 2022-04-12: 1 via ORAL
  Filled 2022-04-12: qty 1

## 2022-04-12 NOTE — Plan of Care (Signed)
  Problem: Education: Goal: Knowledge of General Education information will improve Description: Including pain rating scale, medication(s)/side effects and non-pharmacologic comfort measures Outcome: Adequate for Discharge   Problem: Health Behavior/Discharge Planning: Goal: Ability to manage health-related needs will improve Outcome: Adequate for Discharge   Problem: Clinical Measurements: Goal: Ability to maintain clinical measurements within normal limits will improve Outcome: Adequate for Discharge Goal: Will remain free from infection Outcome: Adequate for Discharge Goal: Diagnostic test results will improve Outcome: Adequate for Discharge Goal: Respiratory complications will improve Outcome: Adequate for Discharge Goal: Cardiovascular complication will be avoided Outcome: Adequate for Discharge   Problem: Activity: Goal: Risk for activity intolerance will decrease Outcome: Adequate for Discharge   Problem: Nutrition: Goal: Adequate nutrition will be maintained Outcome: Adequate for Discharge   Problem: Coping: Goal: Level of anxiety will decrease Outcome: Adequate for Discharge   Problem: Elimination: Goal: Will not experience complications related to bowel motility Outcome: Adequate for Discharge Goal: Will not experience complications related to urinary retention Outcome: Adequate for Discharge   Problem: Pain Managment: Goal: General experience of comfort will improve Outcome: Adequate for Discharge   Problem: Safety: Goal: Ability to remain free from injury will improve Outcome: Adequate for Discharge   Problem: Skin Integrity: Goal: Risk for impaired skin integrity will decrease Outcome: Adequate for Discharge   Problem: Clinical Measurements: Goal: Ability to avoid or minimize complications of infection will improve Outcome: Adequate for Discharge   Problem: Skin Integrity: Goal: Skin integrity will improve Outcome: Adequate for Discharge   

## 2022-04-12 NOTE — Progress Notes (Signed)
  Transition of Care [facility]) Screening Note   Patient Details  Name: [name] Date of Birth: [birth year]   Transition of Care [facility]) CM/SW Contact:    Otelia Santee, LCSW Phone Number: 04/12/2022, 9:24 AM    Transition of Care Department [facility]) has reviewed patient and no TOC needs have been identified at this time. We will continue to monitor patient advancement through interdisciplinary progression rounds. If new patient transition needs arise, please place a TOC consult.

## 2022-04-12 NOTE — Discharge Summary (Signed)
Physician Discharge Summary  [name] [facility] GDJ:[id number] DOB: 24-Mar-1996 DOA: 04/09/2022  PCP: Jake Shark, PA-C  Admit date: 04/09/2022 Discharge date: 04/12/2022 Admitted From: Home. Disposition: Home Recommendations for Outpatient Follow-up:  Follow up with PCP in 1 to 2 weeks Outpatient follow-up with orthopedic surgery in 1 week Check CBC and CMP in 1 week Please follow up on the following pending results: None  Home Health: Not indicated Equipment/Devices: Not indicated  Discharge Condition: Stable CODE STATUS: Full code  Follow-up Information     Jake Shark, PA-C. Schedule an appointment as soon as possible for a visit in 1 week(s).   Specialty: Physician Assistant Contact information: [address] [place] [address] [phone]         Gomez Cleverly, MD. Schedule an appointment as soon as possible for a visit in 1 week(s).   Specialty: Orthopedic Surgery Contact information: [address] [place] [address] [phone]                 Hospital course 26 y.o. 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 Augmentin.  Patient reports tetanus vaccine about a year ago.  Per patient, the cat was fully vaccinated but missed its rabies vaccine this year on 04/04/2022 due to illness.  Patient is hemodynamically stable.  No fever or leukocytosis.  X-ray negative.  Blood cultures obtained.  Started on IV Zosyn.  Underwent bedside I&D on 11/26.  Tissue culture sent.  Antibiotic de-escalated to ceftriaxone, vancomycin and Flagyl.  Blood cultures NGTD and surgical tissue culture NGTD.  On the day of discharge, pain and swelling improved.  Infectious disease consulted and recommended p.o. Augmentin and doxycycline for 18 more days on discharge.  Patient to continue daily wound care per orthopedic  surgery.  She will follow-up with orthopedic surgery in 1 week.  See individual problem list below for more.   Problems addressed during this hospitalization Principal Problem:   Cellulitis of right hand Active Problems:   MDD (major depressive disorder)   History of tetralogy of Fallot   Tenosynovitis of right hand              Vital signs Vitals:   04/11/22 0500 04/11/22 1241 04/11/22 1920 04/12/22 0600  BP: 100/60 100/88 102/61 (!) 104/52  Pulse: 68 64 60 63  Temp: 98.2 F (36.8 C) 98.3 F (36.8 C) 98.2 F (36.8 C) 98 F (36.7 C)  Resp: 16 16 18 18   Height:      Weight:      SpO2: 98% 100% 100% 100%  TempSrc: Oral Oral Oral Oral  BMI (Calculated):         Discharge exam  GENERAL: No apparent distress.  Nontoxic. HEENT: MMM.  Vision and hearing grossly intact.  NECK: Supple.  No apparent JVD.  RESP:  No IWOB.  Fair aeration bilaterally. CVS:  RRR.  Loud heart murmur globally.   ABD/GI/GU: BS+. Abd soft, NTND.  MSK/EXT:  Moves extremities.  Right hand swelling with open wound dorsally.  Almost full range of motion in the fingers. SKIN: As above NEURO: Awake and alert. Oriented appropriately.  No apparent focal neuro deficit. PSYCH: Calm. Normal affect.   Discharge Instructions Discharge Instructions     Call MD for:  extreme fatigue   Complete by: As directed    Call MD  for:  severe uncontrolled pain   Complete by: As directed    Call MD for:  temperature >100.4   Complete by: As directed    Diet general   Complete by: As directed    Discharge instructions   Complete by: As directed    It has been a pleasure taking care of you!  You were hospitalized due to right hand infection after cat bite for which you have been treated surgically and medically with antibiotics.  Your pain and swelling improved.  We are discharging you more antibiotics to complete treatment course.  It is very important that you complete the whole course of antibiotic.  Follow-up  with orthopedic surgery in 1 week.    Take care,   Discharge wound care:   Complete by: As directed    Follow wound care instruction by orthopedic surgery. Right hand: Remove bandage, Perform Warm soapy water/saline soaks 10-15 mins. Pat dry, apply new packing and bandage with non-adherent, gauze, light wrap.   Increase activity slowly   Complete by: As directed       Allergies as of 04/12/2022       Reactions   Dopamine    Makes WBC rise cardiac arrest   Dobutamine Other (See Comments)   High WBC   Peanut-containing Drug Products    Hazel nuts [place] nuts pistachios        Medication List     TAKE these medications    acetaminophen 325 MG tablet Commonly known as: TYLENOL Take 2 tablets (650 mg total) by mouth every 6 (six) hours for 4 days, THEN 2 tablets (650 mg total) every 6 (six) hours as needed for up to 6 days for mild pain (or Fever >/= 101). Start taking on: April 12, 2022   albuterol 108 (90 Base) MCG/ACT inhaler Commonly known as: VENTOLIN HFA Inhale 2 puffs into the lungs every 6 (six) hours as needed.   amoxicillin-clavulanate 875-125 MG tablet Commonly known as: AUGMENTIN Take 1 tablet by mouth 2 (two) times daily for 18 days.   ARIPiprazole 5 MG tablet Commonly known as: ABILIFY Take 5 mg by mouth daily.   doxycycline 100 MG capsule Commonly known as: Vibramycin Take 1 capsule (100 mg total) by mouth 2 (two) times daily for 18 days.               Discharge Care Instructions  (From admission, onward)           Start     Ordered   04/12/22 0000  Discharge wound care:       Comments: Follow wound care instruction by orthopedic surgery. Right hand: Remove bandage, Perform Warm soapy water/saline soaks 10-15 mins. Pat dry, apply new packing and bandage with non-adherent, gauze, light wrap.   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 EXAM: RIGHT HAND - COMPLETE 3+ VIEW COMPARISON:  None Available. FINDINGS: There is no evidence of fracture or dislocation. There is no evidence of arthropathy or other focal bone abnormality. Soft tissues swelling about the dorsum of the hand. No radiopaque foreign body. IMPRESSION: Soft tissue swelling about the dorsum of the hand. No radiopaque foreign body. No acute osseous abnormality Electronically Signed   By: Larose HiresImran  Ahmed D.O.   On:  04/09/2022 15:25       The results of significant diagnostics from this hospitalization (including imaging, microbiology, ancillary and laboratory) are listed below for reference.     Microbiology: Recent Results (from the past 240 hour(s))  Blood culture (routine x 2)     Status: None (Preliminary result)   Collection Time: 04/09/22  5:25 PM   Specimen: BLOOD LEFT FOREARM  Result Value Ref Range Status   Specimen Description   Final    BLOOD LEFT FOREARM Performed at [facility], [address] [place], [place] [address]    Special Requests   Final    BOTTLES DRAWN AEROBIC ONLY Blood Culture adequate volume Performed at [facility], [address], [place], [place] [address]    Culture   Final    NO GROWTH 2 DAYS Performed at [facility], [address], [place], [place] [address]    Report Status PENDING  Incomplete  Blood culture (routine x 2)     Status: None (Preliminary result)   Collection Time: 04/09/22  5:26 PM   Specimen: BLOOD  Result Value Ref Range Status   Specimen Description   Final    BLOOD LEFT ANTECUBITAL Performed at [facility], [address], [place], [place] [address]    Special Requests   Final    BOTTLES DRAWN AEROBIC AND ANAEROBIC Blood Culture adequate volume Performed at [facility], [address], [place],  [place] [address]    Culture   Final    NO GROWTH 2 DAYS Performed at [facility], [address], [place], [place] [address]    Report Status PENDING  Incomplete  Aerobic Culture w Gram Stain (superficial specimen)     Status: None (Preliminary result)   Collection Time: 04/10/22  9:15 AM   Specimen: Hand  Result Value Ref Range Status   Specimen Description   Final    HAND Performed at [facility], [address], [place], [place] [address]    Special Requests   Final    NONE Performed at [facility] [address], [place], [place] [address]    Gram Stain NO ORGANISMS SEEN NO WBC SEEN   Final   Culture   Final    NO GROWTH 2 DAYS Performed at [facility], [address], [place], [place] [address]    Report Status PENDING  Incomplete     Labs:  CBC: Recent Labs  Lab 04/09/22 1726 04/11/22 0533 04/12/22 0427  WBC 6.1 3.9* 3.3*  NEUTROABS 4.8  --  1.5*  HGB 13.0 13.5 12.4  HCT 39.3 41.8 38.6  MCV 93.1 94.4 95.5  PLT 123* 130* 132*   BMP &GFR Recent Labs  Lab 04/09/22 1726 04/11/22 0533 04/12/22 0427  NA 136 140 139  K 3.8 4.3 4.1  CL 104 108 110  CO2 23 23 21*  GLUCOSE 126* 94 98  BUN 11 14 12   CREATININE 0.72 0.69 0.67  CALCIUM 9.6 9.6 9.1  MG  --   --  2.1  PHOS  --   --  4.7*   Estimated Creatinine Clearance: 94.4 mL/min (by C-G formula based on SCr of 0.67 mg/dL). Liver & Pancreas: Recent Labs  Lab 04/12/22 0427  ALBUMIN 3.7   No results for input(s): "LIPASE", "AMYLASE" in the last 168 hours. No results for input(s): "AMMONIA" in the last 168 hours. Diabetic: No results for input(s): "HGBA1C" in  the last 72 hours. No results for input(s): "GLUCAP" in the last 168 hours. Cardiac Enzymes: No results for input(s): "CKTOTAL", "CKMB", "CKMBINDEX", "TROPONINI" in the last 168 hours. No results for input(s): "PROBNP" in the last 8760 hours. Coagulation Profile: No results for input(s): "INR",  "PROTIME" in the last 168 hours. Thyroid Function Tests: No results for input(s): "TSH", "T4TOTAL", "FREET4", "T3FREE", "THYROIDAB" in the last 72 hours. Lipid Profile: No results for input(s): "CHOL", "HDL", "LDLCALC", "TRIG", "CHOLHDL", "LDLDIRECT" in the last 72 hours. Anemia Panel: No results for input(s): "VITAMINB12", "FOLATE", "FERRITIN", "TIBC", "IRON", "RETICCTPCT" in the last 72 hours. Urine analysis:    Component Value Date/Time   COLORURINE YELLOW (A) 02/19/2019 2105   APPEARANCEUR TURBID (A) 02/19/2019 2105   LABSPEC 1.030 11/19/2020 1013   PHURINE 5.0 11/13/2020 1708   GLUCOSEU NEGATIVE 11/13/2020 1708   HGBUR NEGATIVE 11/13/2020 1708   BILIRUBINUR small (A) 11/19/2020 1013   KETONESUR trace (5) (A) 11/19/2020 1013   KETONESUR 40 (A) 11/13/2020 1708   PROTEINUR trace (A) 11/19/2020 1013   PROTEINUR 30 (A) 11/13/2020 1708   UROBILINOGEN 0.2 11/13/2020 1708   NITRITE Negative 11/19/2020 1013   NITRITE NEGATIVE 11/13/2020 1708   LEUKOCYTESUR Negative 11/19/2020 1013   LEUKOCYTESUR NEGATIVE 11/13/2020 1708   Sepsis Labs: Invalid input(s): "PROCALCITONIN", "LACTICIDVEN"   SIGNED:  Almon Hercules, MD  [facility] 04/12/2022, 2:13 PM

## 2022-04-12 NOTE — Consult Note (Incomplete)
[facility]                                                                                        Patient Identification: Patient Name: Alexis [name] MRN: [id number] Admit Date: 04/09/2022  4:46 PM Today's Date: 04/12/2022 Reason for consult: Cat bite wound Requesting provider: Dr. Alanda Slim  Principal Problem:   Cellulitis of right hand Active Problems:   MDD (major depressive disorder)   History of tetralogy of Fallot   Tenosynovitis of right hand   Antibiotics:  Zosyn 11/24 Vancomycin 11/25-c Metronidazole  11/25-c Ceftriaxone 11/25-c  Lines/Hardware: No known   Assessment 26-year-old female with PMH as below including tetralogy of Fallot, GAD/ bipolar disorder presented to the ED with provoked cat bite wound I indoor) in the right hand. 4 doses of P.o. Augmentin before I&D on `11/25 by surgery. Cx NG in less than 24 hrs  `  Recommendations  Okay to switch to doxycycline and Augmentin when ready to go home.  Do not think it is failure of p.o. Augmentin as patient had taken only 4 doses of antibiotics.  It seems to be more of a source control issue which was taken care of by I&D with significant improvement Duration -3 weeks from I&D. Follow-up cultures to completion Follow-up in ID clinic in 1 to 2 weeks will be arranged  Rest of the management as per the primary team. Please call with questions or concerns.  Thank you for the consult  Odette Fraction, MD Infectious Disease Physician [facility] [address] [place], [place] [address] Phone: [phone]  Fax: [phone]  __________________________________________________________________________________________________________ HPI and Hospital Course: 26 year old female with PMH as below who presented to the ED 11/24 after she had a cat bite on her right hand on 11/22  while she was trying to control the cat.  She works as a Fish farm manager.  Tells me the cat is up-to-date on all vaccination except the rabies vaccine which expired 2 days ago on 11/19.  She tells me the cat is indoor.  She tells me she was not given rabies ED as the vaccine had just recently expired and cat was indoor.  She was seen at the [facility] [employer]. at [facility] and was given a course of Augmentin which she took around 4 doses before seen in the ED on 11/24 she had progressive worsening swelling and pain in her right hand.   At ED afebrile,  Labs remarkable for CRP 4, lactic acid and WBC within normal limit.  [place] [address] Imagings as below Seen by hand surgery and had bedside I&D done on 11/25.  Cultures no growth to date in less than 24 hours Spoke with patient and her mom who states that there is significant improvement of the pain and swelling after the I&D  ROS: all systems reviewed with pertinent positive and negative as listed above  Past Medical History:  Diagnosis Date   Allergy    Auditory hallucination    Bipolar disorder (HCC)    Deliberate self-cutting    GAD (generalized anxiety disorder)  Heart disease    Incomplete RBBB 01/2019   [facility]   Leukopenia 01/27/2021   Migraines    Right ovarian cyst 02/20/2019   3.7 cm right ovarian cyst.   Schizoaffective disorder (HCC)    Suicidal ideation 02/12/2020   Tetralogy of Fallot    Past Surgical History:  Procedure Laterality Date   BIOPSY  03/08/2019   Procedure: BIOPSY;  Surgeon: Jeani HawkingHung, Patrick, MD;  Location: [facility] ENDOSCOPY;  Service: Endoscopy;;   CARDIAC SURGERY     CARDIAC SURGERY     4 open heart surgeries   ESOPHAGOGASTRODUODENOSCOPY (EGD) WITH PROPOFOL N/A 03/08/2019   Procedure: ESOPHAGOGASTRODUODENOSCOPY (EGD) WITH PROPOFOL;  Surgeon: Jeani HawkingHung, Patrick, MD;  Location: [facility] ENDOSCOPY;  Service: Endoscopy;  Laterality: N/A;   GASTROSTOMY W/ FEEDING TUBE     removed 1 year ago    THORACIC  DUCT LIGATION       Scheduled Meds:  ARIPiprazole  5 mg Oral Daily   Continuous Infusions:  cefTRIAXone (ROCEPHIN)  IV 1 g (04/11/22 16100937)   metronidazole Stopped (04/11/22 2212)   vancomycin 750 mg (04/11/22 2305)   PRN Meds:.acetaminophen **OR** acetaminophen, HYDROcodone-acetaminophen, morphine injection, ondansetron **OR** ondansetron (ZOFRAN) IV, prochlorperazine, senna-docusate  Allergies  Allergen Reactions   Dopamine     Makes WBC rise cardiac arrest   Dobutamine Other (See Comments)    High WBC   Peanut-Containing Drug Products     Hazel nuts [place] nuts pistachios   Social History   Socioeconomic History   Marital status: Single    Spouse name: Not on file   Number of children: 0   Years of education: College   Highest education level: Not on file  Occupational History   Occupation: Consulting civil engineertudent   Occupation: Data processing managervet assistant  Tobacco Use   Smoking status: Never   Smokeless tobacco: Never  Vaping Use   Vaping Use: Never used  Substance and Sexual Activity   Alcohol use: Yes    Comment: occas   Drug use: Yes    Types: Marijuana    Comment: Occas.  Hemp   Sexual activity: Not Currently    Partners: Male  Other Topics Concern   Not on file  Social History Narrative   Lives at home with mother.   Right-handed.   No more than 2 cups caffeine per day.      Works at [employer]   Social Determinants of Health   Financial Resource Strain: Not on file  Food Insecurity: No Food Insecurity (04/10/2022)   Hunger Vital Sign    Worried About Running Out of Food in the Last Year: Never true    Ran Out of Food in the Last Year: Never true  Transportation Needs: No Transportation Needs (04/10/2022)   PRAPARE - Administrator, Civil ServiceTransportation    Lack of Transportation (Medical): No    Lack of Transportation (Non-Medical): No  Physical Activity: Not on file  Stress: Not on file  Social Connections: Not on file  Intimate Partner Violence: Not At Risk (04/10/2022)    Humiliation, Afraid, Rape, and Kick questionnaire    Fear of Current or Ex-Partner: No    Emotionally Abused: No    Physically Abused: No    Sexually Abused: No   Family History  Problem Relation Age of Onset   Hypertension Mother    Healthy Father    Hypertension Maternal Grandmother    Diabetes Maternal Grandfather    Heart disease Maternal Grandfather    Stroke Maternal Grandfather  Kidney disease Paternal Grandfather    Heart attack Other    Stroke Other    Colon cancer Neg Hx    Vitals BP (!) 104/52 (BP Location: Left Arm)   Pulse 63   Temp 98 F (36.7 C) (Oral)   Resp 18   Ht 5\' 5"  (1.651 m)   Wt 55.6 kg   SpO2 100%   BMI 20.40 kg/m     Physical Exam Constitutional: Young female sitting up in the bed and appears comfortable    Comments:   Cardiovascular:     Rate and Rhythm: Normal rate and regular rhythm.     Heart sounds: Pansystolic murmur  Pulmonary:     Effort: Pulmonary effort is normal.     Comments: Normal breath sounds  Abdominal:     Palpations: Abdomen is soft.     Tenderness: Nontender and nondistended  Musculoskeletal:        General: No swelling or tenderness in peripheral joints.   Wound in the right dorsal hand, appears clean with no active drainage with no surrounding signs of cellulitis.  Range of motion of her right fingers right wrist and right elbow within normal limit  Skin:    Comments: No rashes   Neurological:     General: awake, alert and oriented. Follows commands appropriately   Psychiatric:        Mood and Affect: Mood normal.    Pertinent Microbiology Results for orders placed or performed during the hospital encounter of 04/09/22  Blood culture (routine x 2)     Status: None (Preliminary result)   Collection Time: 04/09/22  5:25 PM   Specimen: BLOOD LEFT FOREARM  Result Value Ref Range Status   Specimen Description   Final    BLOOD LEFT FOREARM Performed at [facility], [address]  [place], [place]    Special Requests   Final    BOTTLES DRAWN AEROBIC ONLY Blood Culture adequate volume Performed at [facility], [address], [place], [place]    Culture   Final    NO GROWTH 2 DAYS Performed at [facility], [address], [place], [place]    Report Status PENDING  Incomplete  Blood culture (routine x 2)     Status: None (Preliminary result)   Collection Time: 04/09/22  5:26 PM   Specimen: BLOOD  Result Value Ref Range Status   Specimen Description   Final    BLOOD LEFT ANTECUBITAL Performed at [facility], [address] [place], [place]    Special Requests   Final    BOTTLES DRAWN AEROBIC AND ANAEROBIC Blood Culture adequate volume Performed at [facility], [address], [place], [place]    Culture   Final    NO GROWTH 2 DAYS Performed at [facility], [address] [place], [place]    Report Status PENDING  Incomplete  Aerobic Culture w Gram Stain (superficial specimen)     Status: None (Preliminary result)   Collection Time: 04/10/22  9:15 AM   Specimen: Hand  Result Value Ref Range Status   Specimen Description   Final    HAND Performed at [facility], [address] [place], [place]    Special Requests   Final    NONE Performed at [facility], [address], [place], [place]    Gram Stain NO ORGANISMS  SEEN NO WBC SEEN   Final   Culture   Final    NO GROWTH < 24 HOURS Performed at [facility], [address] [place], [place] [address]    Report Status PENDING  Incomplete     Pertinent Lab seen by me: -  Pertinent Imagings/Other Imagings Plain films and CT images have been personally visualized and interpreted; radiology reports have been reviewed. Decision making incorporated into the Impression / Recommendations.  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 EXAM: RIGHT HAND - COMPLETE 3+ VIEW COMPARISON:  None Available. FINDINGS: There is no evidence of fracture or dislocation. There is no evidence of arthropathy or other focal bone abnormality. Soft tissues swelling about the dorsum of the hand. No radiopaque foreign body. IMPRESSION: Soft tissue swelling about the dorsum of the hand. No radiopaque foreign body. No acute osseous abnormality Electronically Signed   By: Larose Hires D.O.   On: 04/09/2022 15:25      I spent 80 minutes for this patient encounter including review of prior medical records/discussing diagnostics and treatment plan with the patient/family/coordinate care with primary/other specialits with greater than 50% of time in face to face encounter.   Electronically signed by:   Odette Fraction, MD Infectious Disease Physician [facility] Pager: [phone]

## 2022-04-13 ENCOUNTER — Telehealth: Payer: Self-pay | Admitting: Nurse Practitioner

## 2022-04-13 ENCOUNTER — Encounter: Payer: Self-pay | Admitting: Infectious Diseases

## 2022-04-13 ENCOUNTER — Other Ambulatory Visit: Payer: Self-pay

## 2022-04-13 DIAGNOSIS — D72819 Decreased white blood cell count, unspecified: Secondary | ICD-10-CM

## 2022-04-13 LAB — AEROBIC CULTURE W GRAM STAIN (SUPERFICIAL SPECIMEN)
Culture: NO GROWTH
Gram Stain: NONE SEEN

## 2022-04-13 NOTE — Telephone Encounter (Signed)
Transition Care Management Follow-up Telephone Call Date of discharge and from where: 04/12/2022 Alexis [name]  How have you been since you were released from the hospital? Very Sore Any questions or concerns? Yes  Pt was bitten on the hand by a cat wound became infected  Items Reviewed: Did the pt receive and understand the discharge instructions provided? Yes  Medications obtained and verified? Yes  Other? Yes  Any new allergies since your discharge? No  Dietary orders reviewed? No Do you have support at home? Yes   Home Care and Equipment/Supplies: Were home health services ordered? not applicable PCP Hospital f/u appt confirmed? Yes  Scheduled to see Les Pou on 04/20/2022 @ 2:00..  Are transportation arrangements needed? No  If their condition worsens, is the pt aware to call PCP or go to the Emergency Dept.? Yes Was the patient provided with contact information for the PCP's office or ED? Yes Was to pt encouraged to call back with questions or concerns? Yes

## 2022-04-13 NOTE — Progress Notes (Signed)
     Component 3 d ago  Specimen Description HAND Performed at [facility], [address] [place], [place] [address]  Special Requests NONE Performed at [facility], [address] [place], [place] [address]  Gram Stain NO ORGANISMS SEEN NO WBC SEEN  Culture NO GROWTH 3 DAYS Performed at [facility], [address], [place], [place] [address]  Report Status 04/13/2022 FINAL      Odette Fraction, MD Infectious Disease Physician [facility] [address] [place], [place] [address] Phone: [phone]  Fax: [phone]

## 2022-04-14 DIAGNOSIS — F4310 Post-traumatic stress disorder, unspecified: Secondary | ICD-10-CM | POA: Diagnosis not present

## 2022-04-14 DIAGNOSIS — F250 Schizoaffective disorder, bipolar type: Secondary | ICD-10-CM | POA: Diagnosis not present

## 2022-04-15 ENCOUNTER — Inpatient Hospital Stay: Payer: Federal, State, Local not specified - PPO | Attending: Family Medicine

## 2022-04-15 ENCOUNTER — Encounter: Payer: Self-pay | Admitting: Nurse Practitioner

## 2022-04-15 ENCOUNTER — Inpatient Hospital Stay: Payer: Federal, State, Local not specified - PPO | Admitting: Nurse Practitioner

## 2022-04-15 VITALS — BP 105/73 | HR 66 | Temp 98.1°F | Resp 20 | Ht 65.0 in | Wt 118.8 lb

## 2022-04-15 DIAGNOSIS — G43909 Migraine, unspecified, not intractable, without status migrainosus: Secondary | ICD-10-CM | POA: Diagnosis not present

## 2022-04-15 DIAGNOSIS — D72819 Decreased white blood cell count, unspecified: Secondary | ICD-10-CM | POA: Diagnosis not present

## 2022-04-15 DIAGNOSIS — Q213 Tetralogy of Fallot: Secondary | ICD-10-CM | POA: Diagnosis not present

## 2022-04-15 DIAGNOSIS — L02511 Cutaneous abscess of right hand: Secondary | ICD-10-CM | POA: Diagnosis not present

## 2022-04-15 DIAGNOSIS — Q255 Atresia of pulmonary artery: Secondary | ICD-10-CM | POA: Insufficient documentation

## 2022-04-15 DIAGNOSIS — F259 Schizoaffective disorder, unspecified: Secondary | ICD-10-CM | POA: Insufficient documentation

## 2022-04-15 DIAGNOSIS — D696 Thrombocytopenia, unspecified: Secondary | ICD-10-CM

## 2022-04-15 LAB — CBC WITH DIFFERENTIAL (CANCER CENTER ONLY)
Abs Immature Granulocytes: 0.02 10*3/uL (ref 0.00–0.07)
Basophils Absolute: 0.1 10*3/uL (ref 0.0–0.1)
Basophils Relative: 2 %
Eosinophils Absolute: 0.1 10*3/uL (ref 0.0–0.5)
Eosinophils Relative: 3 %
HCT: 44.6 % (ref 36.0–46.0)
Hemoglobin: 14.6 g/dL (ref 12.0–15.0)
Immature Granulocytes: 1 %
Lymphocytes Relative: 35 %
Lymphs Abs: 1.3 10*3/uL (ref 0.7–4.0)
MCH: 30.4 pg (ref 26.0–34.0)
MCHC: 32.7 g/dL (ref 30.0–36.0)
MCV: 92.7 fL (ref 80.0–100.0)
Monocytes Absolute: 0.5 10*3/uL (ref 0.1–1.0)
Monocytes Relative: 13 %
Neutro Abs: 1.7 10*3/uL (ref 1.7–7.7)
Neutrophils Relative %: 46 %
Platelet Count: 182 10*3/uL (ref 150–400)
RBC: 4.81 MIL/uL (ref 3.87–5.11)
RDW: 12.5 % (ref 11.5–15.5)
WBC Count: 3.6 10*3/uL — ABNORMAL LOW (ref 4.0–10.5)
nRBC: 0 % (ref 0.0–0.2)

## 2022-04-15 LAB — CULTURE, BLOOD (ROUTINE X 2)
Culture: NO GROWTH
Culture: NO GROWTH
Special Requests: ADEQUATE
Special Requests: ADEQUATE

## 2022-04-15 NOTE — Progress Notes (Signed)
  [facility] OFFICE PROGRESS NOTE   Diagnosis: Leukopenia and thrombocytopenia  INTERVAL HISTORY:   Ms. Alexis [name] returns as scheduled.  She was hospitalized 11/24 to 04/12/2022 with cellulitis of the right hand following a cat bite.  She underwent an I&D procedure, required IV antibiotics.  She is now followed by infectious disease.  She reports having bronchitis followed by pneumonia earlier this year.  She has had a few colds.  She continues to note easy bruising.  No spontaneous bleeding.  Main complaint is fatigue.  She has applied to veterinary school.  Objective:  Vital signs in last 24 hours:  Blood pressure 105/73, pulse 66, temperature 98.1 F (36.7 C), temperature source Oral, resp. rate 20, height 5\' 5"  (1.651 m), weight 118 lb 12.8 oz (53.9 kg), SpO2 100 %.    HEENT: No bleeding within the oral cavity. Resp: Lungs clear bilaterally. Cardio: Regular rate and rhythm.  2/6 systolic murmur. GI: No hepatosplenomegaly. Vascular: No leg edema. Skin: No petechiae.   Lab Results:  Lab Results  Component Value Date   WBC 3.6 (L) 04/15/2022   HGB 14.6 04/15/2022   HCT 44.6 04/15/2022   MCV 92.7 04/15/2022   PLT 182 04/15/2022   NEUTROABS 1.7 04/15/2022    Imaging:  No results found.  Medications: I have reviewed the patient's current medications.  Assessment/Plan: Mild leukopenia Mild thrombocytopenia Tetralogy of Fallot/pulmonary atresia Schizoaffective disorder Migraines Cellulitis/abscess right hand following a cat bite status post I&D, IV antibiotics; followed by ID  Disposition: Ms. 04/17/2022 remains stable from a hematologic standpoint.  She understands to seek evaluation for signs of infection, spontaneous bleeding.  She will return for a CBC and follow-up visit in 1 year.    Alexis [name] ANP/GNP-BC   04/15/2022  1:12 PM

## 2022-04-19 DIAGNOSIS — W5501XA Bitten by cat, initial encounter: Secondary | ICD-10-CM | POA: Diagnosis not present

## 2022-04-19 DIAGNOSIS — F29 Unspecified psychosis not due to a substance or known physiological condition: Secondary | ICD-10-CM | POA: Diagnosis not present

## 2022-04-19 DIAGNOSIS — M79641 Pain in right hand: Secondary | ICD-10-CM | POA: Diagnosis not present

## 2022-04-19 DIAGNOSIS — F331 Major depressive disorder, recurrent, moderate: Secondary | ICD-10-CM | POA: Diagnosis not present

## 2022-04-20 ENCOUNTER — Ambulatory Visit: Payer: Federal, State, Local not specified - PPO | Admitting: Nurse Practitioner

## 2022-04-20 ENCOUNTER — Encounter: Payer: Self-pay | Admitting: Nurse Practitioner

## 2022-04-20 ENCOUNTER — Inpatient Hospital Stay: Payer: Federal, State, Local not specified - PPO | Admitting: Infectious Diseases

## 2022-04-20 VITALS — BP 100/70 | HR 68 | Temp 98.4°F | Resp 18 | Ht 65.0 in | Wt 124.0 lb

## 2022-04-20 DIAGNOSIS — R110 Nausea: Secondary | ICD-10-CM

## 2022-04-20 DIAGNOSIS — L03113 Cellulitis of right upper limb: Secondary | ICD-10-CM

## 2022-04-20 MED ORDER — TRAMADOL HCL 50 MG PO TABS: 50.0000 mg | ORAL_TABLET | Freq: Three times a day (TID) | ORAL | 0 refills | Status: AC | PRN

## 2022-04-20 MED ORDER — ONDANSETRON HCL 4 MG PO TABS: 4.0000 mg | ORAL_TABLET | Freq: Three times a day (TID) | ORAL | 2 refills | Status: DC | PRN

## 2022-04-20 NOTE — Patient Instructions (Signed)
Watch for signs of worsening infection, redness, swelling, drainage, or fevers and notify immediately.   I have sent in zofran to help with the stomach pains and tramadol for pain.

## 2022-04-20 NOTE — Progress Notes (Signed)
  Tollie Eth, DNP, AGNP-c [facility] [address] [place], [place] [address] [phone]  Subjective:   Alexis [name] is a 26 y.o. female presents to day for evaluation of: Cat Bite/Hospital FU Alexis was recently seen in the hospital for cat bite to her hand.  Unfortunately the bite did become infected and cellulitis presented.  She has been in to see the hand surgeon since leaving the hospital and will start physical therapy tomorrow.  She does still have limited range of motion and use of the hand which is very limiting for her as she is right handed.  She is wearing her brace to help with pain  She denies any new or worsening symptoms.  At this time she is still having quite a bit of pain in the hand.  PMH, Medications, and Allergies reviewed and updated in chart as appropriate.   ROS negative except for what is listed in HPI. Objective:  BP 100/70   Pulse 68   Temp 98.4 F (36.9 C) (Tympanic)   Resp 18   Ht 5\' 5"  (1.651 m)   Wt 124 lb (56.2 kg)   LMP 04/09/2022   BMI 20.63 kg/m  Physical Exam Vitals and nursing note reviewed.  Constitutional:      Appearance: Normal appearance.  HENT:     Head: Normocephalic.  Eyes:     Extraocular Movements: Extraocular movements intact.     Pupils: Pupils are equal, round, and reactive to light.  Cardiovascular:     Rate and Rhythm: Normal rate and regular rhythm.     Pulses: Normal pulses.     Heart sounds: Normal heart sounds.  Pulmonary:     Effort: Pulmonary effort is normal.     Breath sounds: Normal breath sounds.  Musculoskeletal:        General: Swelling, tenderness and signs of injury present.     Comments: Erythema and edema are present to the right hand with evidence of decreased range of motion.  Symptoms consistent with cellulitis.  Lymphadenopathy:     Cervical: No cervical adenopathy.  Skin:    General: Skin is warm and dry.     Capillary Refill: Capillary refill takes less than 2 seconds.   Neurological:     General: No focal deficit present.     Mental Status: She is alert and oriented to person, place, and time.     Sensory: No sensory deficit.     Motor: Weakness present.     Coordination: Coordination normal.  Psychiatric:        Mood and Affect: Mood normal.           Assessment & Plan:   Problem List Items Addressed This Visit     Cellulitis of right hand - Primary   Relevant Medications   ondansetron (ZOFRAN) 4 MG tablet   Other Visit Diagnoses     Nausea       Relevant Medications   ondansetron (ZOFRAN) 4 MG tablet         04/11/2022, DNP, AGNP-c 05/05/2022  7:19 PM    History, Medications, Surgery, SDOH, and Family History reviewed and updated as appropriate.

## 2022-04-21 ENCOUNTER — Ambulatory Visit (INDEPENDENT_AMBULATORY_CARE_PROVIDER_SITE_OTHER): Payer: No Typology Code available for payment source | Admitting: Internal Medicine

## 2022-04-21 ENCOUNTER — Other Ambulatory Visit: Payer: Self-pay

## 2022-04-21 ENCOUNTER — Encounter: Payer: Self-pay | Admitting: Internal Medicine

## 2022-04-21 ENCOUNTER — Ambulatory Visit (INDEPENDENT_AMBULATORY_CARE_PROVIDER_SITE_OTHER): Payer: Federal, State, Local not specified - PPO

## 2022-04-21 VITALS — BP 105/68 | HR 67 | Temp 98.1°F | Ht 65.0 in | Wt 122.0 lb

## 2022-04-21 DIAGNOSIS — L02519 Cutaneous abscess of unspecified hand: Secondary | ICD-10-CM

## 2022-04-21 DIAGNOSIS — Z23 Encounter for immunization: Secondary | ICD-10-CM | POA: Diagnosis not present

## 2022-04-21 DIAGNOSIS — F250 Schizoaffective disorder, bipolar type: Secondary | ICD-10-CM | POA: Diagnosis not present

## 2022-04-21 DIAGNOSIS — F4310 Post-traumatic stress disorder, unspecified: Secondary | ICD-10-CM | POA: Diagnosis not present

## 2022-04-21 NOTE — Progress Notes (Unsigned)
Patient Active Problem List   Diagnosis Date Noted   Tenosynovitis of right hand 04/10/2022   Cellulitis of right hand 04/09/2022   History of tetralogy of Fallot 12/04/2020   Dizzy 12/04/2020   Thrombocytopenia (HCC) 12/04/2020   Depression 09/24/2020   Chronic migraine without aura without status migrainosus, not intractable 06/30/2020   History of open heart surgery 06/30/2020   Bipolar I disorder (HCC) 02/12/2020   Schizoaffective disorder (HCC) 02/11/2020   Vitamin D deficiency 11/02/2019   GAD (generalized anxiety disorder) 03/13/2018   MDD (major depressive disorder) 03/13/2018   Chronic migraine without aura 08/31/2012   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   Adjustment disorder 07/26/2011   Abdominal pain 07/26/2011    Patient's Medications  New Prescriptions   No medications on file  Previous Medications   ACETAMINOPHEN (TYLENOL) 325 MG TABLET    Take 2 tablets (650 mg total) by mouth every 6 (six) hours for 4 days, THEN 2 tablets (650 mg total) every 6 (six) hours as needed for up to 6 days for mild pain (or Fever >/= 101).   ALBUTEROL (VENTOLIN HFA) 108 (90 BASE) MCG/ACT INHALER    Inhale 2 puffs into the lungs every 6 (six) hours as needed.   AMOXICILLIN-CLAVULANATE (AUGMENTIN) 875-125 MG TABLET    Take 1 tablet by mouth 2 (two) times daily for 18 days.   ARIPIPRAZOLE (ABILIFY) 5 MG TABLET    Take 5 mg by mouth daily.   DOXYCYCLINE (VIBRAMYCIN) 100 MG CAPSULE    Take 1 capsule (100 mg total) by mouth 2 (two) times daily for 18 days.   FLUOXETINE (PROZAC) 10 MG CAPSULE    Take 10 mg by mouth daily.   ONDANSETRON (ZOFRAN) 4 MG TABLET    Take 1 tablet (4 mg total) by mouth every 8 (eight) hours as needed for nausea or vomiting.   TRAMADOL (ULTRAM) 50 MG TABLET    Take 1 tablet (50 mg total) by mouth every 8 (eight) hours as needed for up to 5 days.  Modified Medications   No medications on file  Discontinued Medications   No medications on file     Subjective:  26 year old female with past medical history as below including tetralogy of Fallot, GAD/BPD presents for hospital follow-up of hand wound.  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  She was up-to-date on her vaccines except for rabies which expired 2 days prior to admission.  She was given  Augmentin outpatient and took about 4 doses prior to presenting to the ED.  On arrival vital stable.  She was seen by hide surgery and I&D done on lites 11/25 with negative cultures.  Seen by ID and discharged on 3 weeks of Augmentin and doxycycline, EOT 12/15. Today: She reports she does not have full ROM of her right hand. Seen by hand surgery and given a brace, PT. She has some nausea with meds and started on zofran from PCP.  Reports stools were soft today.  Review of Systems: Review of Systems  All other systems reviewed and are negative.   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   Schizoaffective disorder (HCC)    Suicidal ideation 02/12/2020   Tetralogy of Fallot     Social History   Tobacco Use   Smoking status: Never   Smokeless tobacco: Never  Vaping Use   Vaping Use: Never used  Substance Use Topics   Alcohol use: Yes    Comment: occas   Drug use: Yes    Types: Marijuana    Comment: Occas.  Hemp    Family History  Problem Relation Age of Onset   Hypertension Mother    Healthy Father    Hypertension Maternal Grandmother    Diabetes Maternal Grandfather    Heart disease Maternal Grandfather    Stroke Maternal Grandfather    Kidney disease Paternal Grandfather    Heart attack Other    Stroke  Other    Colon cancer Neg Hx     Allergies  Allergen Reactions   Dopamine     Makes WBC rise cardiac arrest   Dobutamine Other (See Comments)    High WBC   Peanut-Containing Drug Products     Hazel nuts [place] nuts pistachios    Health Maintenance  Topic Date Due   HPV VACCINES (1 - 2-dose series) Never done   Hepatitis C Screening  Never done   COVID-19 Vaccine (3 - Pfizer risk series) 10/18/2019   PAP-Cervical Cytology Screening  12/03/2022   PAP SMEAR-Modifier  12/03/2022   DTaP/Tdap/Td (3 - Td or Tdap) 10/31/2029   INFLUENZA VACCINE  Completed   HIV Screening  Completed    Objective:  There were no vitals filed for this visit. There is no height or weight on file to calculate BMI.  Physical Exam Constitutional:      Appearance: Normal appearance.  HENT:     Head: Normocephalic and atraumatic.     Right Ear: Tympanic membrane normal.     Left Ear: Tympanic membrane normal.     Nose: Nose normal.     Mouth/Throat:     Mouth: Mucous membranes are moist.  Eyes:     Extraocular Movements: Extraocular movements intact.     Conjunctiva/sclera: Conjunctivae normal.     Pupils: Pupils are equal, round, and reactive to light.  Cardiovascular:     Rate and Rhythm: Normal rate and regular rhythm.     Heart sounds: No murmur heard.    No friction rub. No gallop.  Pulmonary:     Effort: Pulmonary effort is normal.     Breath sounds: Normal breath sounds.  Abdominal:     General: Abdomen is flat.     Palpations: Abdomen is soft.  Musculoskeletal:        General: Normal range of motion.  Skin:    General: Skin is warm and dry.  Neurological:     General: No focal deficit present.     Mental Status: She is alert and oriented to person, place, and time.  Psychiatric:        Mood and Affect: Mood normal.     Lab Results Lab Results  Component Value Date   WBC 3.6 (L) 04/15/2022   HGB 14.6 04/15/2022   HCT 44.6 04/15/2022   MCV 92.7 04/15/2022   PLT 182  04/15/2022    Lab Results  Component Value Date   CREATININE 0.67 04/12/2022   BUN 12 04/12/2022   NA 139 04/12/2022  K 4.1 04/12/2022   CL 110 04/12/2022   CO2 21 (L) 04/12/2022    Lab Results  Component Value Date   ALT 14 10/06/2021   AST 19 10/06/2021   ALKPHOS 63 10/06/2021   BILITOT 0.6 10/06/2021    Lab Results  Component Value Date   CHOL 158 02/12/2020   HDL 58 02/12/2020   LDLCALC 82 02/12/2020   TRIG 89 02/12/2020   CHOLHDL 2.7 02/12/2020   No results found for: "LABRPR", "RPRTITER" No results found for: "HIV1RNAQUANT", "HIV1RNAVL", "CD4TABS"   Problem List Items Addressed This Visit   None  #Hand bite by cat status post I&D(bedside with Dr. Vincente Poli 11/25 with negative cultures #Medication monitoring - Patient had about 4 days Augmentin prior to I&D, Xray on adissio showed soft tissue swelling without osseus involvement.  - Continue doxycycline and Augmentin to complete 3 weeks antibiotics EOT 12/15 - Pt was seen by hand surgery Dr. Yehuda Budd and placed a brace, Rx PT Plan: - Labs today - Flu shot - Release of records for hand surgery, continue to follow with hand surgery - Follow with ID in about a month.  #Nausea-Abx associated #Soft stools -Pt was written zofran by PCP, counseled pt to take zofran 1/2 hour prior to taking antibiotics -Her stools have been soft starting today. Pt has probioitcs at home that she palns on starting  Danelle Earthly, MD [facility] Infectious Disease [facility] 04/21/2022, 8:31 AM

## 2022-04-21 NOTE — Progress Notes (Unsigned)
Signed release of information faxed to Dr. Yehuda Budd at [facility] requesting last office note be faxed to triage.   F: [phone]  Sandie Ano, RN'

## 2022-04-22 LAB — COMPLETE METABOLIC PANEL WITH GFR
AG Ratio: 1.8 (calc) (ref 1.0–2.5)
ALT: 15 U/L (ref 6–29)
AST: 14 U/L (ref 10–30)
Albumin: 4.3 g/dL (ref 3.6–5.1)
Alkaline phosphatase (APISO): 57 U/L (ref 31–125)
BUN: 11 mg/dL (ref 7–25)
CO2: 23 mmol/L (ref 20–32)
Calcium: 9.5 mg/dL (ref 8.6–10.2)
Chloride: 106 mmol/L (ref 98–110)
Creat: 0.89 mg/dL (ref 0.50–0.96)
Globulin: 2.4 g/dL (calc) (ref 1.9–3.7)
Glucose, Bld: 85 mg/dL (ref 65–99)
Potassium: 4.4 mmol/L (ref 3.5–5.3)
Sodium: 138 mmol/L (ref 135–146)
Total Bilirubin: 0.5 mg/dL (ref 0.2–1.2)
Total Protein: 6.7 g/dL (ref 6.1–8.1)
eGFR: 92 mL/min/{1.73_m2} (ref >=60)

## 2022-04-22 LAB — CBC WITH DIFFERENTIAL/PLATELET
Absolute Monocytes: 455 cells/uL (ref 200–950)
Basophils Absolute: 41 cells/uL (ref 0–200)
Basophils Relative: 1.1 %
Eosinophils Absolute: 52 cells/uL (ref 15–500)
Eosinophils Relative: 1.4 %
HCT: 40.1 % (ref 35.0–45.0)
Hemoglobin: 13.4 g/dL (ref 11.7–15.5)
Lymphs Abs: 847 cells/uL — ABNORMAL LOW (ref 850–3900)
MCH: 30.6 pg (ref 27.0–33.0)
MCHC: 33.4 g/dL (ref 32.0–36.0)
MCV: 91.6 fL (ref 80.0–100.0)
MPV: 11.1 fL (ref 7.5–12.5)
Monocytes Relative: 12.3 %
Neutro Abs: 2305 cells/uL (ref 1500–7800)
Neutrophils Relative %: 62.3 %
Platelets: 140 10*3/uL (ref 140–400)
RBC: 4.38 10*6/uL (ref 3.80–5.10)
RDW: 12.5 % (ref 11.0–15.0)
Total Lymphocyte: 22.9 %
WBC: 3.7 10*3/uL — ABNORMAL LOW (ref 3.8–10.8)

## 2022-04-22 LAB — C-REACTIVE PROTEIN: CRP: 0.8 mg/L (ref <8.0)

## 2022-04-22 LAB — SEDIMENTATION RATE: Sed Rate: 22 mm/h — ABNORMAL HIGH (ref 0–20)

## 2022-04-23 DIAGNOSIS — M79641 Pain in right hand: Secondary | ICD-10-CM | POA: Diagnosis not present

## 2022-04-26 ENCOUNTER — Ambulatory Visit: Payer: Federal, State, Local not specified - PPO | Admitting: Family Medicine

## 2022-04-27 DIAGNOSIS — M79641 Pain in right hand: Secondary | ICD-10-CM | POA: Diagnosis not present

## 2022-04-28 DIAGNOSIS — N760 Acute vaginitis: Secondary | ICD-10-CM | POA: Diagnosis not present

## 2022-04-28 DIAGNOSIS — R300 Dysuria: Secondary | ICD-10-CM | POA: Diagnosis not present

## 2022-04-28 DIAGNOSIS — F250 Schizoaffective disorder, bipolar type: Secondary | ICD-10-CM | POA: Diagnosis not present

## 2022-04-28 DIAGNOSIS — B9689 Other specified bacterial agents as the cause of diseases classified elsewhere: Secondary | ICD-10-CM | POA: Diagnosis not present

## 2022-04-28 DIAGNOSIS — F4310 Post-traumatic stress disorder, unspecified: Secondary | ICD-10-CM | POA: Diagnosis not present

## 2022-04-30 DIAGNOSIS — M79641 Pain in right hand: Secondary | ICD-10-CM | POA: Diagnosis not present

## 2022-05-03 ENCOUNTER — Encounter: Payer: Self-pay | Admitting: Family Medicine

## 2022-05-03 ENCOUNTER — Ambulatory Visit: Payer: Federal, State, Local not specified - PPO | Admitting: Family Medicine

## 2022-05-03 VITALS — BP 122/79 | HR 80 | Temp 98.1°F | Resp 16 | Ht 65.0 in | Wt 122.2 lb

## 2022-05-03 DIAGNOSIS — N920 Excessive and frequent menstruation with regular cycle: Secondary | ICD-10-CM | POA: Insufficient documentation

## 2022-05-03 DIAGNOSIS — Z7689 Persons encountering health services in other specified circumstances: Secondary | ICD-10-CM | POA: Diagnosis not present

## 2022-05-03 DIAGNOSIS — N83209 Unspecified ovarian cyst, unspecified side: Secondary | ICD-10-CM | POA: Insufficient documentation

## 2022-05-03 DIAGNOSIS — M79641 Pain in right hand: Secondary | ICD-10-CM | POA: Diagnosis not present

## 2022-05-03 DIAGNOSIS — B3731 Acute candidiasis of vulva and vagina: Secondary | ICD-10-CM | POA: Diagnosis not present

## 2022-05-03 DIAGNOSIS — K5900 Constipation, unspecified: Secondary | ICD-10-CM | POA: Insufficient documentation

## 2022-05-03 DIAGNOSIS — R634 Abnormal weight loss: Secondary | ICD-10-CM | POA: Insufficient documentation

## 2022-05-03 DIAGNOSIS — K625 Hemorrhage of anus and rectum: Secondary | ICD-10-CM | POA: Insufficient documentation

## 2022-05-03 MED ORDER — FLUCONAZOLE 150 MG PO TABS: 150.0000 mg | ORAL_TABLET | ORAL | 1 refills | Status: DC

## 2022-05-03 NOTE — Progress Notes (Unsigned)
Patient is here to established care with provider today. Patient has many health concern they would like to discuss with provider today  Care gaps discuss at appointment today  

## 2022-05-04 ENCOUNTER — Encounter: Payer: Self-pay | Admitting: Family Medicine

## 2022-05-04 NOTE — Progress Notes (Signed)
New Patient Office Visit  Subjective    Patient ID: Alexis [name], female    DOB: September 05, 1995  Age: 26 y.o. MRN: [id number]  CC:  Chief Complaint  Patient presents with   Establish Care    HPI Alexis [name] presents to establish care and for complaint of vaginal yeast infection. Patient reports that she just completed a 3 week course of antibiotics. She did have diflucan before the course was completed but her sxs persist.    Outpatient Encounter Medications as of 05/03/2022  Medication Sig   albuterol (VENTOLIN HFA) 108 (90 Base) MCG/ACT inhaler Inhale 2 puffs into the lungs every 6 (six) hours as needed.   ARIPiprazole (ABILIFY) 5 MG tablet Take 5 mg by mouth daily.   FLUoxetine (PROZAC) 10 MG capsule Take 10 mg by mouth daily.   clotrimazole (GYNE-LOTRIMIN) 1 % vaginal cream Place vaginally. (Patient not taking: Reported on 05/03/2022)   fluconazole (DIFLUCAN) 150 MG tablet Take 1 tablet (150 mg total) by mouth every 7 (seven) days.   ondansetron (ZOFRAN) 4 MG tablet Take 1 tablet (4 mg total) by mouth every 8 (eight) hours as needed for nausea or vomiting. (Patient not taking: Reported on 05/03/2022)   traZODone (DESYREL) 50 MG tablet Take by mouth. (Patient not taking: Reported on 05/03/2022)   [DISCONTINUED] fluconazole (DIFLUCAN) 150 MG tablet Take 1 tablet at first sign of yeast infection may repeat in 48  If needed. (Patient not taking: Reported on 05/03/2022)   No facility-administered encounter medications on file as of 05/03/2022.    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   Schizoaffective disorder (HCC)    Suicidal ideation 02/12/2020   Tetralogy of Fallot     Past  Surgical History:  Procedure Laterality Date   BIOPSY  03/08/2019   Procedure: BIOPSY;  Surgeon: Jeani Hawking, MD;  Location: [facility];  Service: Endoscopy;;   CARDIAC SURGERY     CARDIAC SURGERY     4 open heart surgeries   ESOPHAGOGASTRODUODENOSCOPY (EGD) WITH PROPOFOL N/A 03/08/2019   Procedure: ESOPHAGOGASTRODUODENOSCOPY (EGD) WITH PROPOFOL;  Surgeon: Jeani Hawking, MD;  Location: [facility] ENDOSCOPY;  Service: Endoscopy;  Laterality: N/A;   GASTROSTOMY W/ FEEDING TUBE     removed 1 year ago    THORACIC DUCT LIGATION      Family History  Problem Relation Age of Onset   Hypertension Mother    Healthy Father    Hypertension Maternal Grandmother    Diabetes Maternal Grandfather    Heart disease Maternal Grandfather    Stroke Maternal Grandfather    Kidney disease Paternal Grandfather    Heart attack Other    Stroke Other    Colon cancer Neg Hx     Social History   Socioeconomic History   Marital status: Single    Spouse name: Not on file   Number of children: 0   Years of education: College   Highest education level: Not on file  Occupational History   Occupation: Consulting civil engineer   Occupation: Data processing manager  Tobacco Use   Smoking status: Never   Smokeless tobacco: Never  Advertising account planner  Vaping Use: Never used  Substance and Sexual Activity   Alcohol use: Yes    Comment: occas   Drug use: Yes    Types: Marijuana    Comment: Occas.  Hemp   Sexual activity: Not Currently    Partners: Male  Other Topics Concern   Not on file  Social History Narrative   Lives at home with mother.   Right-handed.   No more than 2 cups caffeine per day.      Works at [employer]   Social Determinants of Health   Financial Resource Strain: Not on file  Food Insecurity: No Food Insecurity (04/10/2022)   Hunger Vital Sign    Worried About Running Out of Food in the Last Year: Never true    Ran Out of Food in the Last Year: Never true  Transportation Needs: No Transportation  Needs (04/10/2022)   PRAPARE - Administrator, Civil Service (Medical): No    Lack of Transportation (Non-Medical): No  Physical Activity: Not on file  Stress: Not on file  Social Connections: Not on file  Intimate Partner Violence: Not At Risk (04/10/2022)   Humiliation, Afraid, Rape, and Kick questionnaire    Fear of Current or Ex-Partner: No    Emotionally Abused: No    Physically Abused: No    Sexually Abused: No    Review of Systems  All other systems reviewed and are negative.       Objective    BP 122/79   Pulse 80   Temp 98.1 F (36.7 C) (Oral)   Resp 16   Ht 5\' 5"  (1.651 m)   Wt 122 lb 3.2 oz (55.4 kg)   LMP 04/09/2022   SpO2 98%   BMI 20.34 kg/m   Physical Exam Vitals and nursing note reviewed.  Constitutional:      General: She is not in acute distress. Cardiovascular:     Rate and Rhythm: Normal rate and regular rhythm.  Pulmonary:     Effort: Pulmonary effort is normal.     Breath sounds: Normal breath sounds.  Neurological:     General: No focal deficit present.     Mental Status: She is alert and oriented to person, place, and time.         Assessment & Plan:   1. Yeast vaginitis Diflucan prescribed for prolonged course.   2. Encounter to establish care     Return in about 3 months (around 08/02/2022) for physical.   08/04/2022, MD

## 2022-05-05 DIAGNOSIS — F250 Schizoaffective disorder, bipolar type: Secondary | ICD-10-CM | POA: Diagnosis not present

## 2022-05-05 DIAGNOSIS — F4310 Post-traumatic stress disorder, unspecified: Secondary | ICD-10-CM | POA: Diagnosis not present

## 2022-05-23 ENCOUNTER — Encounter: Payer: Self-pay | Admitting: Family Medicine

## 2022-05-24 ENCOUNTER — Telehealth: Payer: Federal, State, Local not specified - PPO | Admitting: Emergency Medicine

## 2022-05-24 DIAGNOSIS — U071 COVID-19: Secondary | ICD-10-CM | POA: Diagnosis not present

## 2022-05-24 MED ORDER — MOLNUPIRAVIR EUA 200MG CAPSULE: 4.0000 | ORAL_CAPSULE | Freq: Two times a day (BID) | ORAL | 0 refills | Status: AC

## 2022-05-24 NOTE — Patient Instructions (Signed)
  [name] Schreck, thank you for joining Montine Circle, PA-C for today's virtual visit.  While this provider is not your primary care provider (PCP), if your PCP is located in our provider database this encounter information will be shared with them immediately following your visit.   [facility] account gives you access to today's visit and all your visits, tests, and labs performed at [facility] " click here if you don't have a [facility] account or go to [email or web address]  Consent: (Patient) Alexis [name] provided verbal consent for this virtual visit at the beginning of the encounter.  Current Medications:  Current Outpatient Medications:    molnupiravir EUA (LAGEVRIO) 200 mg CAPS capsule, Take 4 capsules (800 mg total) by mouth 2 (two) times daily for 5 days., Disp: 40 capsule, Rfl: 0   albuterol (VENTOLIN HFA) 108 (90 Base) MCG/ACT inhaler, Inhale 2 puffs into the lungs every 6 (six) hours as needed., Disp: , Rfl:    ARIPiprazole (ABILIFY) 5 MG tablet, Take 5 mg by mouth daily., Disp: , Rfl:    clotrimazole (GYNE-LOTRIMIN) 1 % vaginal cream, Place vaginally. (Patient not taking: Reported on 05/03/2022), Disp: , Rfl:    fluconazole (DIFLUCAN) 150 MG tablet, Take 1 tablet (150 mg total) by mouth every 7 (seven) days., Disp: 4 tablet, Rfl: 1   FLUoxetine (PROZAC) 10 MG capsule, Take 10 mg by mouth daily., Disp: , Rfl:    ondansetron (ZOFRAN) 4 MG tablet, Take 1 tablet (4 mg total) by mouth every 8 (eight) hours as needed for nausea or vomiting. (Patient not taking: Reported on 05/03/2022), Disp: 30 tablet, Rfl: 2   traZODone (DESYREL) 50 MG tablet, Take by mouth. (Patient not taking: Reported on 05/03/2022), Disp: , Rfl:    Medications ordered in this encounter:  Meds ordered this encounter  Medications   molnupiravir EUA (LAGEVRIO) 200 mg CAPS capsule    Sig: Take 4 capsules (800 mg total) by mouth 2 (two) times daily for 5 days.    Dispense:  40  capsule    Refill:  0    Order Specific Question:   Supervising Provider    Answer:   Chase Picket [id number]     *If you need refills on other medications prior to your next appointment, please contact your pharmacy*  Follow-Up: Call back or seek an in-person evaluation if the symptoms worsen or if the condition fails to improve as anticipated.  [place] [phone]  Other Instructions    If you have been instructed to have an in-person evaluation today at a local Urgent Care facility, please use the link below. It will take you to a list of all of our available [facility], including address, phone number and hours of operation. Please do not delay care.  [facility]  If you or a family member do not have a primary care provider, use the link below to schedule a visit and establish care. When you choose a [place] primary care physician or advanced practice provider, you gain a long-term partner in health. Find a Primary Care Provider  Learn more about 's in-office and virtual care options: [facility]

## 2022-05-24 NOTE — Progress Notes (Signed)
Virtual Visit Consent   Alexis [name], you are scheduled for a virtual visit with a [facility] provider today. Just as with appointments in the office, your consent must be obtained to participate. Your consent will be active for this visit and any virtual visit you may have with one of our providers in the next 365 days. If you have a MyChart account, a copy of this consent can be sent to you electronically.  As this is a virtual visit, video technology does not allow for your provider to perform a traditional examination. This may limit your provider's ability to fully assess your condition. If your provider identifies any concerns that need to be evaluated in person or the need to arrange testing (such as labs, EKG, etc.), we will make arrangements to do so. Although advances in technology are sophisticated, we cannot ensure that it will always work on either your end or our end. If the connection with a video visit is poor, the visit may have to be switched to a telephone visit. With either a video or telephone visit, we are not always able to ensure that we have a secure connection.  By engaging in this virtual visit, you consent to the provision of healthcare and authorize for your insurance to be billed (if applicable) for the services provided during this visit. Depending on your insurance coverage, you may receive a charge related to this service.  I need to obtain your verbal consent now. Are you willing to proceed with your visit today? Alexis [name] has provided verbal consent on 05/24/2022 for a virtual visit (video or telephone). Montine Circle, PA-C  Date: 05/24/2022 10:32 AM  Virtual Visit via Video Note   I, Montine Circle, connected with  Alexis [name]  ([id number], 08-08-1995) on 05/24/22 at 10:30 AM EST by a video-enabled telemedicine application and verified that I am speaking with the correct person using two identifiers.  Location: Patient: Virtual Visit Location Patient:  Home Provider: Virtual Visit Location Provider: Home Office   I discussed the limitations of evaluation and management by telemedicine and the availability of in person appointments. The patient expressed understanding and agreed to proceed.    History of Present Illness: Alexis [name] is a 27 y.o. who identifies as a female who was assigned female at birth, and is being seen today for COVID 59.  States that she tested positive yesterday.  Reports sinus congestion, lack of taste, cough, body aches, fevers and chills. Reports heart history.  States that she'd like to try an antiviral.  She also states that she initially had some dizziness and SOB, but this has resolved.  HPI: HPI  Problems:  Patient Active Problem List   Diagnosis Date Noted   Abnormal weight loss 05/03/2022   Cyst of ovary 05/03/2022   Hemorrhage of rectum and anus 05/03/2022   Menorrhagia 05/03/2022   Constipation 05/03/2022   Tenosynovitis of right hand 04/10/2022   Cellulitis of right hand 04/09/2022   Nocturnal hypoxia 10/02/2021   Restrictive lung disease 08/28/2021   History of tetralogy of Fallot 12/04/2020   Dizzy 12/04/2020   Thrombocytopenia ([place]) 12/04/2020   Depression 09/24/2020   Chronic migraine without aura without status migrainosus, not intractable 06/30/2020   History of open heart surgery 06/30/2020   Bipolar I disorder ([place]) 02/12/2020   Schizoaffective disorder ([place]) 02/11/2020   Vitamin D deficiency 11/02/2019   Exposure to severe acute respiratory syndrome coronavirus 2 (SARS-CoV-2) 09/06/2019   GAD (generalized anxiety  disorder) 03/13/2018   MDD (major depressive disorder) 03/13/2018   Chronic migraine without aura 08/31/2012   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 (Patient not taking: Reported on 05/03/2022), Disp: , Rfl:    fluconazole (DIFLUCAN) 150 MG tablet, Take 1 tablet (150 mg total) by mouth every 7 (seven) days., Disp: 4 tablet, Rfl: 1   FLUoxetine (PROZAC) 10 MG capsule, Take 10 mg by mouth daily., Disp: , Rfl:    ondansetron (ZOFRAN) 4 MG tablet, Take 1 tablet (4 mg total) by mouth every 8 (eight) hours as needed for nausea or vomiting. (Patient not taking: Reported on 05/03/2022), Disp: 30 tablet, Rfl: 2   traZODone (DESYREL) 50 MG tablet, Take by mouth. (Patient not taking: Reported on 05/03/2022), Disp: , Rfl:   Observations/Objective: Patient is well-developed, well-nourished in no acute distress.  Resting comfortably  at home.  Head is normocephalic, atraumatic.  No labored breathing.  Speech is clear and coherent with logical content.  Patient is alert and oriented at baseline.    Assessment and Plan: 1. COVID-19   Meds ordered this encounter  Medications   molnupiravir EUA (LAGEVRIO) 200 mg CAPS capsule    Sig: Take 4 capsules (800 mg total) by mouth 2 (two) times daily for 5 days.    Dispense:  40 capsule    Refill:  0    Order Specific Question:   Supervising Provider    Answer:   Merrilee Jansky [id number]    Patient with COVID.  Requests antiviral.  Will treat with Molnupiravir.  Tylenol for fever. If having worsening symptoms, go to ER.   Follow Up Instructions: I discussed the assessment and treatment plan with the patient. The patient was provided an opportunity to ask questions and all were answered. The patient agreed with the plan  and demonstrated an understanding of the instructions.  A copy of instructions were sent to the patient via MyChart unless otherwise noted below.     The patient was advised to call back or seek an in-person evaluation if the symptoms worsen or if the condition fails to improve as anticipated.  Time:  I spent 11 minutes with the patient via telehealth technology discussing the above problems/concerns.    Roxy Horseman, PA-C

## 2022-05-25 DIAGNOSIS — F4310 Post-traumatic stress disorder, unspecified: Secondary | ICD-10-CM | POA: Diagnosis not present

## 2022-05-25 DIAGNOSIS — F250 Schizoaffective disorder, bipolar type: Secondary | ICD-10-CM | POA: Diagnosis not present

## 2022-05-26 ENCOUNTER — Ambulatory Visit: Payer: Federal, State, Local not specified - PPO | Admitting: Internal Medicine

## 2022-06-02 DIAGNOSIS — F29 Unspecified psychosis not due to a substance or known physiological condition: Secondary | ICD-10-CM | POA: Diagnosis not present

## 2022-06-02 DIAGNOSIS — F4310 Post-traumatic stress disorder, unspecified: Secondary | ICD-10-CM | POA: Diagnosis not present

## 2022-06-02 DIAGNOSIS — F250 Schizoaffective disorder, bipolar type: Secondary | ICD-10-CM | POA: Diagnosis not present

## 2022-06-02 DIAGNOSIS — F331 Major depressive disorder, recurrent, moderate: Secondary | ICD-10-CM | POA: Diagnosis not present

## 2022-06-11 DIAGNOSIS — F4310 Post-traumatic stress disorder, unspecified: Secondary | ICD-10-CM | POA: Diagnosis not present

## 2022-06-11 DIAGNOSIS — F250 Schizoaffective disorder, bipolar type: Secondary | ICD-10-CM | POA: Diagnosis not present

## 2022-06-14 ENCOUNTER — Ambulatory Visit: Payer: Federal, State, Local not specified - PPO | Admitting: Internal Medicine

## 2022-06-14 NOTE — Progress Notes (Deleted)
Patient Active Problem List   Diagnosis Date Noted   Abnormal weight loss 05/03/2022   Cyst of ovary 05/03/2022   Hemorrhage of rectum and anus 05/03/2022   Menorrhagia 05/03/2022   Constipation 05/03/2022   Tenosynovitis of right hand 04/10/2022   Cellulitis of right hand 04/09/2022   Nocturnal hypoxia 10/02/2021   Restrictive lung disease 08/28/2021   History of tetralogy of Fallot 12/04/2020   Dizzy 12/04/2020   Thrombocytopenia ([place]) 12/04/2020   Depression 09/24/2020   Chronic migraine without aura without status migrainosus, not intractable 06/30/2020   History of open heart surgery 06/30/2020   Bipolar I disorder ([place]) 02/12/2020   Schizoaffective disorder ([place]) 02/11/2020   Vitamin D deficiency 11/02/2019   Exposure to severe acute respiratory syndrome coronavirus 2 (SARS-CoV-2) 09/06/2019   GAD (generalized anxiety disorder) 03/13/2018   MDD (major depressive disorder) 03/13/2018   Chronic migraine without aura 08/31/2012   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   ARIPIPRAZOLE (ABILIFY) 5 MG TABLET    Take 5 mg by mouth daily.   CLOTRIMAZOLE (GYNE-LOTRIMIN) 1 % VAGINAL CREAM    Place vaginally.   FLUCONAZOLE (DIFLUCAN) 150 MG TABLET    Take 1 tablet (150 mg total) by mouth every 7 (seven) days.   FLUOXETINE (PROZAC) 10 MG CAPSULE    Take 10 mg by mouth daily.   ONDANSETRON (ZOFRAN) 4 MG TABLET    Take 1 tablet (4 mg total) by mouth every 8 (eight) hours as needed for nausea or vomiting.   TRAZODONE (DESYREL) 50 MG TABLET    Take by mouth.  Modified Medications   No medications on file  Discontinued Medications   No medications on file    Subjective: 27 year old female with past  medical history as below including tetralogy of Fallot, GAD/BPD presents for hospital follow-up of hand wound.  She was admitted to [facility] 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  She was up-to-date on her vaccines except for rabies which expired 2 days prior to admission.  She was given  Augmentin outpatient and took about 4 doses prior to presenting to the ED.  On arrival vital stable.  She was seen by hide surgery and I&D done on lites 11/25 with negative cultures.  Seen by ID and discharged on 3 weeks of Augmentin and doxycycline, EOT 12/15. 04/30/22: She reports she does not have full ROM of her right hand. Seen by hand surgery and given a brace, PT. She has some nausea with meds and started on zofran from PCP.  Reports stools were soft today.   Review of Systems: ROS  Past Medical History:  Diagnosis Date   Allergy    Auditory hallucination    Bipolar disorder ([place])    Deliberate self-cutting    GAD (generalized anxiety disorder)    Heart disease    Incomplete RBBB 01/2019   noted on EKG from [facility]   Leukopenia 01/27/2021   Migraines    Right ovarian cyst 02/20/2019   3.7 cm right ovarian cyst.   Schizoaffective disorder ([place])    Suicidal ideation 02/12/2020   Tetralogy  of Fallot     Social History   Tobacco Use   Smoking status: Never   Smokeless tobacco: Never  Vaping Use   Vaping Use: Never used  Substance Use Topics   Alcohol use: Yes    Comment: occas   Drug use: Yes    Types: Marijuana    Comment: Occas.  Hemp    Family History  Problem Relation Age of Onset   Hypertension Mother    Healthy Father    Hypertension Maternal Grandmother    Diabetes Maternal Grandfather    Heart disease Maternal Grandfather    Stroke Maternal Grandfather    Kidney disease Paternal Grandfather    Heart attack Other    Stroke Other    Colon cancer Neg Hx     Allergies  Allergen Reactions   Dopamine     Makes WBC rise cardiac  arrest   Dobutamine Other (See Comments)    High WBC   Peanut-Containing Drug Products     Hazel nuts [place] nuts pistachios    Health Maintenance  Topic Date Due   Hepatitis C Screening  Never done   COVID-19 Vaccine (4 - 2023-24 season) 06/16/2022   PAP-Cervical Cytology Screening  12/03/2022   PAP SMEAR-Modifier  12/03/2022   DTaP/Tdap/Td (3 - Td or Tdap) 10/31/2029   INFLUENZA VACCINE  Completed   HIV Screening  Completed   HPV VACCINES  Discontinued    Objective:  There were no vitals filed for this visit. There is no height or weight on file to calculate BMI.  Physical Exam  Lab Results Lab Results  Component Value Date   WBC 3.7 (L) 04/21/2022   HGB 13.4 04/21/2022   HCT 40.1 04/21/2022   MCV 91.6 04/21/2022   PLT 140 04/21/2022    Lab Results  Component Value Date   CREATININE 0.89 04/21/2022   BUN 11 04/21/2022   NA 138 04/21/2022   K 4.4 04/21/2022   CL 106 04/21/2022   CO2 23 04/21/2022    Lab Results  Component Value Date   ALT 15 04/21/2022   AST 14 04/21/2022   ALKPHOS 63 10/06/2021   BILITOT 0.5 04/21/2022    Lab Results  Component Value Date   CHOL 158 02/12/2020   HDL 58 02/12/2020   LDLCALC 82 02/12/2020   TRIG 89 02/12/2020   CHOLHDL 2.7 02/12/2020   No results found for: "LABRPR", "RPRTITER" No results found for: "HIV1RNAQUANT", "HIV1RNAVL", "CD4TABS"   Assessment/Plan #Hand bite by cat status post I&D(bedside with Dr. Thomos Lemons 11/25 with negative cultures #Medication monitoring - Patient had about 4 days Augmentin prior to I&D, Xray on adissio showed soft tissue swelling without osseus involvement.  - Continue doxycycline and Augmentin to complete 3 weeks antibiotics EOT 12/15 - Pt was seen by hand surgery Dr. Greta Doom and placed a brace, Rx PT Plan: - Labs today - Flu shot - Release of records for hand surgery, continue to follow with hand surgery - Follow with ID in about a month.   #Nausea-Abx associated #Soft  stools -Pt was written zofran by PCP, counseled pt to take zofran 1/2 hour prior to taking antibiotics -Her stools have been soft starting today. Pt has probioitcs at home that she palns on starting      Laurice Record, [facility] 06/14/2022, 3:22 PM

## 2022-06-22 DIAGNOSIS — F29 Unspecified psychosis not due to a substance or known physiological condition: Secondary | ICD-10-CM | POA: Diagnosis not present

## 2022-06-22 DIAGNOSIS — F331 Major depressive disorder, recurrent, moderate: Secondary | ICD-10-CM | POA: Diagnosis not present

## 2022-06-28 ENCOUNTER — Telehealth: Payer: Federal, State, Local not specified - PPO | Admitting: Family

## 2022-06-28 DIAGNOSIS — J069 Acute upper respiratory infection, unspecified: Secondary | ICD-10-CM

## 2022-06-28 MED ORDER — FLUTICASONE PROPIONATE 50 MCG/ACT NA SUSP: 2.0000 | Freq: Every day | NASAL | 6 refills | Status: AC

## 2022-06-28 MED ORDER — BENZONATATE 100 MG PO CAPS: 100.0000 mg | ORAL_CAPSULE | Freq: Three times a day (TID) | ORAL | 0 refills | Status: DC | PRN

## 2022-06-28 NOTE — Patient Instructions (Signed)

## 2022-06-28 NOTE — Progress Notes (Signed)
Virtual Visit Consent   Alexis [name], you are scheduled for a virtual visit with a [facility] provider today. Just as with appointments in the office, your consent must be obtained to participate. Your consent will be active for this visit and any virtual visit you may have with one of our providers in the next 365 days. If you have a MyChart account, a copy of this consent can be sent to you electronically.  As this is a virtual visit, video technology does not allow for your provider to perform a traditional examination. This may limit your provider's ability to fully assess your condition. If your provider identifies any concerns that need to be evaluated in person or the need to arrange testing (such as labs, EKG, etc.), we will make arrangements to do so. Although advances in technology are sophisticated, we cannot ensure that it will always work on either your end or our end. If the connection with a video visit is poor, the visit may have to be switched to a telephone visit. With either a video or telephone visit, we are not always able to ensure that we have a secure connection.  By engaging in this virtual visit, you consent to the provision of healthcare and authorize for your insurance to be billed (if applicable) for the services provided during this visit. Depending on your insurance coverage, you may receive a charge related to this service.  I need to obtain your verbal consent now. Are you willing to proceed with your visit today? Alexis [name] has provided verbal consent on 06/28/2022 for a virtual visit (video or telephone). Evelina Dun, FNP  Date: 06/28/2022 6:05 PM  Virtual Visit via Video Note   I, Evelina Dun, connected with  Alexis [name]  ([id number], 1995/07/24) on 06/28/22 at  6:00 PM EST by a video-enabled telemedicine application and verified that I am speaking with the correct person using two identifiers.  Location: Patient: Virtual Visit Location Patient:  Home Provider: Virtual Visit Location Provider: Home Office   I discussed the limitations of evaluation and management by telemedicine and the availability of in person appointments. The patient expressed understanding and agreed to proceed.    History of Present Illness: Alexis [name] is a 27 y.o. who identifies as a female who was assigned female at birth, and is being seen today for sinus congestion that started 3 days ago.  HPI: URI  This is a new problem. The current episode started in the past 7 days. The problem has been unchanged. There has been no fever. Associated symptoms include congestion, coughing, ear pain, headaches, rhinorrhea, sinus pain, sneezing and a sore throat. Pertinent negatives include no joint pain or joint swelling. She has tried acetaminophen for the symptoms. The treatment provided mild relief.    Problems:  Patient Active Problem List   Diagnosis Date Noted   Abnormal weight loss 05/03/2022   Cyst of ovary 05/03/2022   Hemorrhage of rectum and anus 05/03/2022   Menorrhagia 05/03/2022   Constipation 05/03/2022   Tenosynovitis of right hand 04/10/2022   Cellulitis of right hand 04/09/2022   Nocturnal hypoxia 10/02/2021   Restrictive lung disease 08/28/2021   History of tetralogy of Fallot 12/04/2020   Dizzy 12/04/2020   Thrombocytopenia ([place]) 12/04/2020   Depression 09/24/2020   Chronic migraine without aura without status migrainosus, not intractable 06/30/2020   History of open heart surgery 06/30/2020   Bipolar I disorder ([place]) 02/12/2020   Schizoaffective disorder ([place]) 02/11/2020  Vitamin D deficiency 11/02/2019   Exposure to severe acute respiratory syndrome coronavirus 2 (SARS-CoV-2) 09/06/2019   GAD (generalized anxiety disorder) 03/13/2018   MDD (major depressive disorder) 03/13/2018   Chronic migraine without aura 08/31/2012   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   Adjustment disorder 07/26/2011   Abdominal pain 07/26/2011     Allergies:  Allergies  Allergen Reactions   Dopamine     Makes WBC rise cardiac arrest   Dobutamine Other (See Comments)    High WBC   Peanut-Containing Drug Products     Hazel nuts [place] nuts pistachios   Medications:  Current Outpatient Medications:    benzonatate (TESSALON PERLES) 100 MG capsule, Take 1 capsule (100 mg total) by mouth 3 (three) times daily as needed., Disp: 20 capsule, Rfl: 0   fluticasone (FLONASE) 50 MCG/ACT nasal spray, Place 2 sprays into both nostrils daily., Disp: 16 g, Rfl: 6   albuterol (VENTOLIN HFA) 108 (90 Base) MCG/ACT inhaler, Inhale 2 puffs into the lungs every 6 (six) hours as needed., Disp: , Rfl:    ARIPiprazole (ABILIFY) 5 MG tablet, Take 5 mg by mouth daily., Disp: , Rfl:    fluconazole (DIFLUCAN) 150 MG tablet, Take 1 tablet (150 mg total) by mouth every 7 (seven) days., Disp: 4 tablet, Rfl: 1   FLUoxetine (PROZAC) 10 MG capsule, Take 10 mg by mouth daily., Disp: , Rfl:   Observations/Objective: Patient is well-developed, well-nourished in no acute distress.  Resting comfortably  at home.  Head is normocephalic, atraumatic.  No labored breathing.  Speech is clear and coherent with logical content.  Patient is alert and oriented at baseline.    Assessment and Plan: 1. Viral URI with cough - fluticasone (FLONASE) 50 MCG/ACT nasal spray; Place 2 sprays into both nostrils daily.  Dispense: 16 g; Refill: 6 - benzonatate (TESSALON PERLES) 100 MG capsule; Take 1 capsule (100 mg total) by mouth 3 (three) times daily as needed.  Dispense: 20 capsule; Refill: 0  - Take meds as prescribed - Use a cool mist humidifier  -Use saline nose sprays frequently -Force fluids -For any cough or congestion  Use plain Mucinex- regular strength or max strength is fine -For fever or aces or pains- take tylenol or ibuprofen. -Throat lozenges if help -Follow up if symptoms worsen or do not improve   Follow Up Instructions: I discussed the assessment and  treatment plan with the patient. The patient was provided an opportunity to ask questions and all were answered. The patient agreed with the plan and demonstrated an understanding of the instructions.  A copy of instructions were sent to the patient via MyChart unless otherwise noted below.     The patient was advised to call back or seek an in-person evaluation if the symptoms worsen or if the condition fails to improve as anticipated.  Time:  I spent 12 minutes with the patient via telehealth technology discussing the above problems/concerns.    Evelina Dun, FNP

## 2022-06-29 DIAGNOSIS — F4310 Post-traumatic stress disorder, unspecified: Secondary | ICD-10-CM | POA: Diagnosis not present

## 2022-06-29 DIAGNOSIS — F250 Schizoaffective disorder, bipolar type: Secondary | ICD-10-CM | POA: Diagnosis not present

## 2022-07-06 DIAGNOSIS — F250 Schizoaffective disorder, bipolar type: Secondary | ICD-10-CM | POA: Diagnosis not present

## 2022-07-06 DIAGNOSIS — F4310 Post-traumatic stress disorder, unspecified: Secondary | ICD-10-CM | POA: Diagnosis not present

## 2022-07-20 DIAGNOSIS — F331 Major depressive disorder, recurrent, moderate: Secondary | ICD-10-CM | POA: Diagnosis not present

## 2022-07-20 DIAGNOSIS — F4310 Post-traumatic stress disorder, unspecified: Secondary | ICD-10-CM | POA: Diagnosis not present

## 2022-07-20 DIAGNOSIS — F250 Schizoaffective disorder, bipolar type: Secondary | ICD-10-CM | POA: Diagnosis not present

## 2022-07-20 DIAGNOSIS — F29 Unspecified psychosis not due to a substance or known physiological condition: Secondary | ICD-10-CM | POA: Diagnosis not present

## 2022-07-27 ENCOUNTER — Ambulatory Visit (INDEPENDENT_AMBULATORY_CARE_PROVIDER_SITE_OTHER): Payer: Federal, State, Local not specified - PPO | Admitting: Family Medicine

## 2022-07-27 VITALS — BP 114/74 | HR 66 | Temp 98.1°F | Resp 16 | Ht 65.25 in | Wt 127.6 lb

## 2022-07-27 DIAGNOSIS — Z30011 Encounter for initial prescription of contraceptive pills: Secondary | ICD-10-CM | POA: Diagnosis not present

## 2022-07-27 DIAGNOSIS — Z13228 Encounter for screening for other metabolic disorders: Secondary | ICD-10-CM | POA: Diagnosis not present

## 2022-07-27 DIAGNOSIS — Z13220 Encounter for screening for lipoid disorders: Secondary | ICD-10-CM | POA: Diagnosis not present

## 2022-07-27 DIAGNOSIS — Z1159 Encounter for screening for other viral diseases: Secondary | ICD-10-CM

## 2022-07-27 DIAGNOSIS — Z1329 Encounter for screening for other suspected endocrine disorder: Secondary | ICD-10-CM

## 2022-07-27 DIAGNOSIS — Z0000 Encounter for general adult medical examination without abnormal findings: Secondary | ICD-10-CM

## 2022-07-27 DIAGNOSIS — Z130 Encounter for screening for diseases of the blood and blood-forming organs and certain disorders involving the immune mechanism: Secondary | ICD-10-CM

## 2022-07-27 MED ORDER — NORGESTIMATE-ETH ESTRADIOL 0.25-35 MG-MCG PO TABS: 1.0000 | ORAL_TABLET | Freq: Every day | ORAL | 0 refills | Status: AC

## 2022-07-28 LAB — CBC WITH DIFFERENTIAL/PLATELET
Basophils Absolute: 0 10*3/uL (ref 0.0–0.2)
Basos: 1 %
EOS (ABSOLUTE): 0.1 10*3/uL (ref 0.0–0.4)
Eos: 2 %
Hematocrit: 43.8 % (ref 34.0–46.6)
Hemoglobin: 14.3 g/dL (ref 11.1–15.9)
Immature Grans (Abs): 0 10*3/uL (ref 0.0–0.1)
Immature Granulocytes: 0 %
Lymphocytes Absolute: 0.9 10*3/uL (ref 0.7–3.1)
Lymphs: 31 %
MCH: 29.5 pg (ref 26.6–33.0)
MCHC: 32.6 g/dL (ref 31.5–35.7)
MCV: 91 fL (ref 79–97)
Monocytes Absolute: 0.3 10*3/uL (ref 0.1–0.9)
Monocytes: 11 %
Neutrophils Absolute: 1.6 10*3/uL (ref 1.4–7.0)
Neutrophils: 55 %
Platelets: 146 10*3/uL — ABNORMAL LOW (ref 150–450)
RBC: 4.84 x10E6/uL (ref 3.77–5.28)
RDW: 13.3 % (ref 11.7–15.4)
WBC: 3 10*3/uL — ABNORMAL LOW (ref 3.4–10.8)

## 2022-07-28 LAB — CMP14+EGFR
ALT: 25 IU/L (ref 0–32)
AST: 23 IU/L (ref 0–40)
Albumin/Globulin Ratio: 1.9 (ref 1.2–2.2)
Albumin: 4.9 g/dL (ref 4.0–5.0)
Alkaline Phosphatase: 81 IU/L (ref 44–121)
BUN/Creatinine Ratio: 11 (ref 9–23)
BUN: 10 mg/dL (ref 6–20)
Bilirubin Total: 0.4 mg/dL (ref 0.0–1.2)
CO2: 23 mmol/L (ref 20–29)
Calcium: 10 mg/dL (ref 8.7–10.2)
Chloride: 101 mmol/L (ref 96–106)
Creatinine, Ser: 0.88 mg/dL (ref 0.57–1.00)
Globulin, Total: 2.6 g/dL (ref 1.5–4.5)
Glucose: 89 mg/dL (ref 70–99)
Potassium: 4.9 mmol/L (ref 3.5–5.2)
Sodium: 138 mmol/L (ref 134–144)
Total Protein: 7.5 g/dL (ref 6.0–8.5)
eGFR: 93 mL/min/{1.73_m2} (ref >59)

## 2022-07-28 LAB — HEPATITIS C ANTIBODY: Hep C Virus Ab: NONREACTIVE

## 2022-07-28 LAB — LIPID PANEL
Chol/HDL Ratio: 2.2 ratio (ref 0.0–4.4)
Cholesterol, Total: 186 mg/dL (ref 100–199)
HDL: 84 mg/dL (ref >39)
LDL Chol Calc (NIH): 89 mg/dL (ref 0–99)
Triglycerides: 73 mg/dL (ref 0–149)
VLDL Cholesterol Cal: 13 mg/dL (ref 5–40)

## 2022-07-29 ENCOUNTER — Encounter: Payer: Self-pay | Admitting: Family Medicine

## 2022-07-29 NOTE — Progress Notes (Signed)
Established Patient Office Visit  Subjective    Patient ID: Alexis [name], female    DOB: 01-Mar-1996  Age: 27 y.o. MRN: [id number]  CC:  Chief Complaint  Patient presents with   Annual Exam    HPI [name] Leppanen presents for routine annual exam. Patient denies acute complaints or concerns.    Outpatient Encounter Medications as of 07/27/2022  Medication Sig   albuterol (VENTOLIN HFA) 108 (90 Base) MCG/ACT inhaler Inhale 2 puffs into the lungs every 6 (six) hours as needed.   ARIPiprazole (ABILIFY) 10 MG tablet Take by mouth.   ARIPiprazole (ABILIFY) 5 MG tablet Take 5 mg by mouth daily.   clotrimazole (GYNE-LOTRIMIN) 1 % vaginal cream Place vaginally.   EPINEPHrine 0.3 mg/0.3 mL IJ SOAJ injection    fluticasone (FLONASE) 50 MCG/ACT nasal spray Place 2 sprays into both nostrils daily.   norgestimate-ethinyl estradiol (ORTHO-CYCLEN) 0.25-35 MG-MCG tablet Take 1 tablet by mouth daily.   [DISCONTINUED] FLUoxetine (PROZAC) 10 MG capsule Take 10 mg by mouth daily.   [DISCONTINUED] benzonatate (TESSALON PERLES) 100 MG capsule Take 1 capsule (100 mg total) by mouth 3 (three) times daily as needed. (Patient not taking: Reported on 07/27/2022)   [DISCONTINUED] fluconazole (DIFLUCAN) 150 MG tablet Take 1 tablet (150 mg total) by mouth every 7 (seven) days. (Patient not taking: Reported on 07/27/2022)   No facility-administered encounter medications on file as of 07/27/2022.    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   Schizoaffective disorder ([place])    Suicidal ideation 02/12/2020   Tetralogy of Fallot     Past Surgical History:  Procedure Laterality Date   BIOPSY  03/08/2019   Procedure:  BIOPSY;  Surgeon: Carol Ada, MD;  Location: [facility];  Service: Endoscopy;;   CARDIAC SURGERY     CARDIAC SURGERY     4 open heart surgeries   ESOPHAGOGASTRODUODENOSCOPY (EGD) WITH PROPOFOL N/A 03/08/2019   Procedure: ESOPHAGOGASTRODUODENOSCOPY (EGD) WITH PROPOFOL;  Surgeon: Carol Ada, MD;  Location: [facility];  Service: Endoscopy;  Laterality: N/A;   GASTROSTOMY W/ FEEDING TUBE     removed 1 year ago    THORACIC DUCT LIGATION      Family History  Problem Relation Age of Onset   Hypertension Mother    Healthy Father    Hypertension Maternal Grandmother    Diabetes Maternal Grandfather    Heart disease Maternal Grandfather    Stroke Maternal Grandfather    Kidney disease Paternal Grandfather    Heart attack Other    Stroke Other    Colon cancer Neg Hx     Social History   Socioeconomic History   Marital status: Single    Spouse name: Not on file   Number of children: 0   Years of education: College   Highest education level: Not on file  Occupational History   Occupation: Ship broker   Occupation: Warehouse manager  Tobacco Use   Smoking status: Never   Smokeless tobacco: Never  Vaping Use   Vaping Use: Never used  Substance and Sexual Activity   Alcohol use: Yes    Comment: occas  Drug use: Yes    Types: Marijuana    Comment: Occas.  Hemp   Sexual activity: Not Currently    Partners: Male  Other Topics Concern   Not on file  Social History Narrative   Lives at home with mother.   Right-handed.   No more than 2 cups caffeine per day.      Works at [employer] Strain: Not on file  Food Insecurity: No Food Insecurity (04/10/2022)   Hunger Vital Sign    Worried About Running Out of Food in the Last Year: Never true    Mark in the Last Year: Never true  Transportation Needs: No Transportation Needs (04/10/2022)   PRAPARE - Hydrologist  (Medical): No    Lack of Transportation (Non-Medical): No  Physical Activity: Not on file  Stress: Not on file  Social Connections: Not on file  Intimate Partner Violence: Not At Risk (04/10/2022)   Humiliation, Afraid, Rape, and Kick questionnaire    Fear of Current or Ex-Partner: No    Emotionally Abused: No    Physically Abused: No    Sexually Abused: No    Review of Systems  All other systems reviewed and are negative.       Objective    BP 114/74   Pulse 66   Temp 98.1 F (36.7 C) (Oral)   Resp 16   Ht 5' 5.25" (1.657 m)   Wt 127 lb 9.6 oz (57.9 kg)   SpO2 98%   BMI 21.07 kg/m   Physical Exam Vitals and nursing note reviewed.  Constitutional:      General: She is not in acute distress. HENT:     Head: Normocephalic and atraumatic.     Right Ear: Tympanic membrane, ear canal and external ear normal.     Left Ear: Tympanic membrane, ear canal and external ear normal.     Nose: Nose normal.     Mouth/Throat:     Mouth: Mucous membranes are moist.     Pharynx: Oropharynx is clear.  Eyes:     Conjunctiva/sclera: Conjunctivae normal.     Pupils: Pupils are equal, round, and reactive to light.  Neck:     Thyroid: No thyromegaly.  Cardiovascular:     Rate and Rhythm: Normal rate and regular rhythm.     Heart sounds: Normal heart sounds. No murmur heard. Pulmonary:     Effort: Pulmonary effort is normal. No respiratory distress.     Breath sounds: Normal breath sounds.  Abdominal:     General: There is no distension.     Palpations: Abdomen is soft. There is no mass.     Tenderness: There is no abdominal tenderness.  Musculoskeletal:        General: Deformity (minor back deformity) present. Normal range of motion.     Cervical back: Normal range of motion and neck supple.  Skin:    General: Skin is warm and dry.  Neurological:     General: No focal deficit present.     Mental Status: She is alert and oriented to person, place, and time.  Psychiatric:         Mood and Affect: Mood normal.        Behavior: Behavior normal.         Assessment & Plan:   1. Annual physical exam  - CMP14+EGFR  2. Encounter for  initial prescription of contraceptive pills Ortho cyclen prescribed. Questions answered.  3. Need for hepatitis C screening test  - CBC with Differential  4. Screening for deficiency anemia  - Hepatitis C Antibody  5. Screening for lipid disorders  - Lipid Panel  6. Screening for endocrine/metabolic/immunity disorders    Return in about 3 months (around 10/27/2022) for follow up.   Becky Sax, MD

## 2022-08-03 DIAGNOSIS — F4310 Post-traumatic stress disorder, unspecified: Secondary | ICD-10-CM | POA: Diagnosis not present

## 2022-08-03 DIAGNOSIS — F250 Schizoaffective disorder, bipolar type: Secondary | ICD-10-CM | POA: Diagnosis not present

## 2022-08-10 DIAGNOSIS — F250 Schizoaffective disorder, bipolar type: Secondary | ICD-10-CM | POA: Diagnosis not present

## 2022-08-10 DIAGNOSIS — F4310 Post-traumatic stress disorder, unspecified: Secondary | ICD-10-CM | POA: Diagnosis not present

## 2022-08-24 DIAGNOSIS — F4310 Post-traumatic stress disorder, unspecified: Secondary | ICD-10-CM | POA: Diagnosis not present

## 2022-08-24 DIAGNOSIS — F250 Schizoaffective disorder, bipolar type: Secondary | ICD-10-CM | POA: Diagnosis not present

## 2022-09-02 DIAGNOSIS — R519 Headache, unspecified: Secondary | ICD-10-CM | POA: Diagnosis not present

## 2022-09-07 DIAGNOSIS — F250 Schizoaffective disorder, bipolar type: Secondary | ICD-10-CM | POA: Diagnosis not present

## 2022-09-07 DIAGNOSIS — F4310 Post-traumatic stress disorder, unspecified: Secondary | ICD-10-CM | POA: Diagnosis not present

## 2022-09-14 DIAGNOSIS — F4310 Post-traumatic stress disorder, unspecified: Secondary | ICD-10-CM | POA: Diagnosis not present

## 2022-09-14 DIAGNOSIS — F250 Schizoaffective disorder, bipolar type: Secondary | ICD-10-CM | POA: Diagnosis not present

## 2022-09-14 DIAGNOSIS — F331 Major depressive disorder, recurrent, moderate: Secondary | ICD-10-CM | POA: Diagnosis not present

## 2022-09-14 DIAGNOSIS — F29 Unspecified psychosis not due to a substance or known physiological condition: Secondary | ICD-10-CM | POA: Diagnosis not present

## 2022-09-28 DIAGNOSIS — F250 Schizoaffective disorder, bipolar type: Secondary | ICD-10-CM | POA: Diagnosis not present

## 2022-09-28 DIAGNOSIS — F4310 Post-traumatic stress disorder, unspecified: Secondary | ICD-10-CM | POA: Diagnosis not present

## 2022-10-05 DIAGNOSIS — F331 Major depressive disorder, recurrent, moderate: Secondary | ICD-10-CM | POA: Diagnosis not present

## 2022-10-05 DIAGNOSIS — F29 Unspecified psychosis not due to a substance or known physiological condition: Secondary | ICD-10-CM | POA: Diagnosis not present

## 2022-10-05 DIAGNOSIS — F4312 Post-traumatic stress disorder, chronic: Secondary | ICD-10-CM | POA: Diagnosis not present

## 2022-10-05 DIAGNOSIS — F250 Schizoaffective disorder, bipolar type: Secondary | ICD-10-CM | POA: Diagnosis not present

## 2022-10-05 DIAGNOSIS — F4310 Post-traumatic stress disorder, unspecified: Secondary | ICD-10-CM | POA: Diagnosis not present

## 2022-10-12 ENCOUNTER — Ambulatory Visit: Payer: Federal, State, Local not specified - PPO

## 2022-10-12 DIAGNOSIS — F250 Schizoaffective disorder, bipolar type: Secondary | ICD-10-CM | POA: Diagnosis not present

## 2022-10-12 DIAGNOSIS — F4310 Post-traumatic stress disorder, unspecified: Secondary | ICD-10-CM | POA: Diagnosis not present

## 2022-10-13 DIAGNOSIS — F331 Major depressive disorder, recurrent, moderate: Secondary | ICD-10-CM | POA: Diagnosis not present

## 2022-10-13 DIAGNOSIS — F29 Unspecified psychosis not due to a substance or known physiological condition: Secondary | ICD-10-CM | POA: Diagnosis not present

## 2022-10-13 DIAGNOSIS — F4312 Post-traumatic stress disorder, chronic: Secondary | ICD-10-CM | POA: Diagnosis not present

## 2022-10-19 ENCOUNTER — Ambulatory Visit (INDEPENDENT_AMBULATORY_CARE_PROVIDER_SITE_OTHER): Payer: Federal, State, Local not specified - PPO

## 2022-10-19 DIAGNOSIS — Z111 Encounter for screening for respiratory tuberculosis: Secondary | ICD-10-CM

## 2022-10-19 DIAGNOSIS — F4310 Post-traumatic stress disorder, unspecified: Secondary | ICD-10-CM | POA: Diagnosis not present

## 2022-10-19 DIAGNOSIS — F250 Schizoaffective disorder, bipolar type: Secondary | ICD-10-CM | POA: Diagnosis not present

## 2022-10-19 NOTE — Progress Notes (Signed)
PPD test perform on right forearm

## 2022-10-20 DIAGNOSIS — F331 Major depressive disorder, recurrent, moderate: Secondary | ICD-10-CM | POA: Diagnosis not present

## 2022-10-20 DIAGNOSIS — F4312 Post-traumatic stress disorder, chronic: Secondary | ICD-10-CM | POA: Diagnosis not present

## 2022-10-21 ENCOUNTER — Telehealth: Payer: Federal, State, Local not specified - PPO

## 2022-10-21 LAB — TB SKIN TEST
Induration: 0 mm
TB Skin Test: NEGATIVE

## 2022-10-26 ENCOUNTER — Telehealth: Payer: Self-pay | Admitting: *Deleted

## 2022-10-26 DIAGNOSIS — F250 Schizoaffective disorder, bipolar type: Secondary | ICD-10-CM | POA: Diagnosis not present

## 2022-10-26 DIAGNOSIS — F4310 Post-traumatic stress disorder, unspecified: Secondary | ICD-10-CM | POA: Diagnosis not present

## 2022-10-26 NOTE — Telephone Encounter (Signed)
Patient called and LVM to inform patient that according to her immunization record we have on file she needs additional vaccines for medical form.  MMR  Meningococcal

## 2022-11-09 ENCOUNTER — Ambulatory Visit: Payer: Federal, State, Local not specified - PPO | Admitting: Family Medicine

## 2022-11-09 ENCOUNTER — Encounter: Payer: Self-pay | Admitting: Family Medicine

## 2022-11-09 VITALS — BP 114/74 | HR 66 | Temp 98.1°F | Resp 16 | Wt 124.4 lb

## 2022-11-09 DIAGNOSIS — Z3202 Encounter for pregnancy test, result negative: Secondary | ICD-10-CM

## 2022-11-09 DIAGNOSIS — Z30013 Encounter for initial prescription of injectable contraceptive: Secondary | ICD-10-CM | POA: Diagnosis not present

## 2022-11-09 LAB — POCT URINE PREGNANCY: Preg Test, Ur: NEGATIVE

## 2022-11-09 MED ORDER — MEDROXYPROGESTERONE ACETATE 150 MG/ML IM SUSP: 150.0000 mg | Freq: Once | INTRAMUSCULAR | Status: DC

## 2022-11-09 MED ORDER — MEDROXYPROGESTERONE ACETATE 150 MG/ML IM SUSP
150.0000 mg | Freq: Once | INTRAMUSCULAR | Status: AC
Start: 2022-11-09 — End: 2022-11-09
  Administered 2022-11-09: 150 mg via INTRAMUSCULAR

## 2022-11-09 MED ORDER — MEDROXYPROGESTERONE ACETATE 104 MG/0.65ML ~~LOC~~ SUSY: 104.0000 mg | PREFILLED_SYRINGE | Freq: Once | SUBCUTANEOUS | Status: DC

## 2022-11-12 ENCOUNTER — Encounter: Payer: Self-pay | Admitting: Family Medicine

## 2022-11-12 NOTE — Progress Notes (Signed)
Established Patient Office Visit  Subjective    Patient ID: Alexis [name], female    DOB: 1995-06-22  Age: 27 y.o. MRN: [id number]  CC:  Chief Complaint  Patient presents with   Contraception    HPI Alexis [name] presents for change of birth control. She reports that she is unable to remember to take the oral meds and has had a pregnancy scare.    Outpatient Encounter Medications as of 11/09/2022  Medication Sig   albuterol (VENTOLIN HFA) 108 (90 Base) MCG/ACT inhaler Inhale 2 puffs into the lungs every 6 (six) hours as needed.   ARIPiprazole (ABILIFY) 10 MG tablet Take by mouth.   ARIPiprazole (ABILIFY) 5 MG tablet Take 5 mg by mouth daily.   clotrimazole (GYNE-LOTRIMIN) 1 % vaginal cream Place vaginally.   EPINEPHrine 0.3 mg/0.3 mL IJ SOAJ injection    fluticasone (FLONASE) 50 MCG/ACT nasal spray Place 2 sprays into both nostrils daily.   norgestimate-ethinyl estradiol (ORTHO-CYCLEN) 0.25-35 MG-MCG tablet Take 1 tablet by mouth daily.   [EXPIRED] medroxyPROGESTERone (DEPO-PROVERA) injection 150 mg    [DISCONTINUED] medroxyPROGESTERone (DEPO-PROVERA) injection 150 mg    [DISCONTINUED] medroxyPROGESTERone (DEPO-SUBQ PROVERA 104) injection 104 mg    No facility-administered encounter medications on file as of 11/09/2022.    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   Schizoaffective disorder ([facility])    Suicidal ideation 02/12/2020   Tetralogy of Fallot     Past Surgical History:  Procedure Laterality Date   BIOPSY  03/08/2019   Procedure: BIOPSY;  Surgeon: Jeani Hawking, MD;  [facility]: [facility];  Service: Endoscopy;;   CARDIAC SURGERY     CARDIAC SURGERY     4 open heart  surgeries   ESOPHAGOGASTRODUODENOSCOPY (EGD) WITH PROPOFOL N/A 03/08/2019   Procedure: ESOPHAGOGASTRODUODENOSCOPY (EGD) WITH PROPOFOL;  Surgeon: Jeani Hawking, MD;  Location: [facility];  Service: Endoscopy;  Laterality: N/A;   GASTROSTOMY W/ FEEDING TUBE     removed 1 year ago    THORACIC DUCT LIGATION      Family History  Problem Relation Age of Onset   Hypertension Mother    Healthy Father    Hypertension Maternal Grandmother    Diabetes Maternal Grandfather    Heart disease Maternal Grandfather    Stroke Maternal Grandfather    Kidney disease Paternal Grandfather    Heart attack Other    Stroke Other    Colon cancer Neg Hx     Social History   Socioeconomic History   Marital status: Single    Spouse name: Not on file   Number of children: 0   Years of education: College   Highest education level: Not on file  Occupational History   Occupation: Consulting civil engineer   Occupation: Data processing manager  Tobacco Use   Smoking status: Never   Smokeless tobacco: Never  Vaping Use   Vaping Use: Never used  Substance and Sexual Activity   Alcohol use: Yes    Comment: occas   Drug use: Yes    Types: Marijuana    Comment: Occas.  Hemp   Sexual activity: Not Currently    Partners: Male  Other Topics Concern   Not on file  Social History Narrative   Lives at home with mother.   Right-handed.   No more than 2 cups caffeine per day.      Works at [employer]   Social Determinants of Health   Financial Resource Strain: Not on file  Food Insecurity: No Food Insecurity (04/10/2022)   Hunger Vital Sign    Worried About Running Out of Food in the Last Year: Never true    Ran Out of Food in the Last Year: Never true  Transportation Needs: No Transportation Needs (04/10/2022)   PRAPARE - Administrator, Civil Service (Medical): No    Lack of Transportation (Non-Medical): No  Physical Activity: Not on file  Stress: Not on file  Social Connections: Not on file   Intimate Partner Violence: Not At Risk (04/10/2022)   Humiliation, Afraid, Rape, and Kick questionnaire    Fear of Current or Ex-Partner: No    Emotionally Abused: No    Physically Abused: No    Sexually Abused: No    Review of Systems  All other systems reviewed and are negative.       Objective    BP 114/74   Pulse 66   Temp 98.1 F (36.7 C) (Oral)   Resp 16   Wt 124 lb 6.4 oz (56.4 kg)   SpO2 98%   BMI 20.54 kg/m   Physical Exam Vitals and nursing note reviewed.  Constitutional:      General: She is not in acute distress. Cardiovascular:     Rate and Rhythm: Normal rate and regular rhythm.  Pulmonary:     Effort: Pulmonary effort is normal.     Breath sounds: Normal breath sounds.  Neurological:     General: No focal deficit present.     Mental Status: She is alert and oriented to person, place, and time.         Assessment & Plan:   1. Encounter for initial prescription of injectable contraceptive Options discussed in detaril will change to depo-provera. monitor - POCT urine pregnancy - medroxyPROGESTERone (DEPO-PROVERA) injection 150 mg    No follow-ups on file.   Tommie Raymond, MD

## 2022-11-29 ENCOUNTER — Other Ambulatory Visit (HOSPITAL_COMMUNITY): Payer: Self-pay

## 2023-03-08 DIAGNOSIS — F29 Unspecified psychosis not due to a substance or known physiological condition: Secondary | ICD-10-CM | POA: Diagnosis not present

## 2023-03-08 DIAGNOSIS — F331 Major depressive disorder, recurrent, moderate: Secondary | ICD-10-CM | POA: Diagnosis not present

## 2023-03-08 DIAGNOSIS — F4312 Post-traumatic stress disorder, chronic: Secondary | ICD-10-CM | POA: Diagnosis not present

## 2023-04-07 ENCOUNTER — Inpatient Hospital Stay: Payer: No Typology Code available for payment source | Attending: Family Medicine

## 2023-04-07 ENCOUNTER — Inpatient Hospital Stay: Payer: Federal, State, Local not specified - PPO | Attending: Oncology | Admitting: Oncology

## 2023-06-03 ENCOUNTER — Other Ambulatory Visit: Payer: Self-pay | Admitting: *Deleted

## 2023-06-03 DIAGNOSIS — D696 Thrombocytopenia, unspecified: Secondary | ICD-10-CM

## 2023-06-03 DIAGNOSIS — D72819 Decreased white blood cell count, unspecified: Secondary | ICD-10-CM

## 2023-06-10 ENCOUNTER — Telehealth: Payer: Self-pay

## 2023-06-10 ENCOUNTER — Inpatient Hospital Stay: Payer: Medicaid Other | Admitting: Oncology

## 2023-06-10 ENCOUNTER — Inpatient Hospital Stay: Payer: Medicaid Other | Attending: Oncology

## 2023-06-10 NOTE — Telephone Encounter (Signed)
Patient missed appointment for labs, and office visit with Dr. Truett Perna on 06/10/23. When trying to reschedule patient she stated that she has moved to [facility] and would call back at a later date to reschedule.
